# Patient Record
Sex: Female | Born: 1975 | Race: White | Hispanic: No | Smoking: Never smoker
Health system: Southern US, Community
[De-identification: ages and names within clinical notes are randomized; demographics above are authoritative.]

## PROBLEM LIST (undated history)

## (undated) DIAGNOSIS — E785 Hyperlipidemia, unspecified: Secondary | ICD-10-CM

## (undated) DIAGNOSIS — K219 Gastro-esophageal reflux disease without esophagitis: Secondary | ICD-10-CM

## (undated) DIAGNOSIS — K589 Irritable bowel syndrome without diarrhea: Secondary | ICD-10-CM

## (undated) DIAGNOSIS — F419 Anxiety disorder, unspecified: Secondary | ICD-10-CM

## (undated) DIAGNOSIS — F32A Depression, unspecified: Secondary | ICD-10-CM

## (undated) DIAGNOSIS — Z9889 Other specified postprocedural states: Secondary | ICD-10-CM

## (undated) DIAGNOSIS — E119 Type 2 diabetes mellitus without complications: Secondary | ICD-10-CM

## (undated) DIAGNOSIS — G40909 Epilepsy, unspecified, not intractable, without status epilepticus: Secondary | ICD-10-CM

## (undated) DIAGNOSIS — T7840XA Allergy, unspecified, initial encounter: Secondary | ICD-10-CM

## (undated) DIAGNOSIS — R112 Nausea with vomiting, unspecified: Secondary | ICD-10-CM

## (undated) DIAGNOSIS — I10 Essential (primary) hypertension: Secondary | ICD-10-CM

## (undated) HISTORY — DX: Type 2 diabetes mellitus without complications: E11.9

## (undated) HISTORY — DX: Hyperlipidemia, unspecified: E78.5

## (undated) HISTORY — PX: COLONOSCOPY: SHX174

## (undated) HISTORY — PX: BUNIONECTOMY: SHX129

## (undated) HISTORY — DX: Essential (primary) hypertension: I10

## (undated) HISTORY — DX: Irritable bowel syndrome, unspecified: K58.9

## (undated) HISTORY — DX: Depression, unspecified: F32.A

## (undated) HISTORY — DX: Gastro-esophageal reflux disease without esophagitis: K21.9

## (undated) HISTORY — PX: OTHER SURGICAL HISTORY: SHX169

## (undated) HISTORY — DX: Anxiety disorder, unspecified: F41.9

## (undated) HISTORY — DX: Other specified postprocedural states: Z98.890

## (undated) HISTORY — DX: Other specified postprocedural states: R11.2

## (undated) HISTORY — DX: Allergy, unspecified, initial encounter: T78.40XA

## (undated) HISTORY — PX: REPAIR ANKLE LIGAMENT: SUR1187

---

## 2005-03-22 ENCOUNTER — Encounter: Payer: Self-pay | Admitting: Internal Medicine

## 2005-10-03 ENCOUNTER — Ambulatory Visit: Payer: Self-pay | Admitting: Internal Medicine

## 2005-10-05 ENCOUNTER — Ambulatory Visit: Payer: Self-pay | Admitting: Internal Medicine

## 2005-10-09 ENCOUNTER — Encounter (HOSPITAL_COMMUNITY): Admission: RE | Admit: 2005-10-09 | Discharge: 2005-11-29 | Payer: Self-pay | Admitting: Internal Medicine

## 2005-11-08 ENCOUNTER — Ambulatory Visit: Payer: Self-pay | Admitting: Internal Medicine

## 2005-11-20 ENCOUNTER — Ambulatory Visit: Payer: Self-pay | Admitting: Internal Medicine

## 2005-12-20 ENCOUNTER — Ambulatory Visit: Payer: Self-pay | Admitting: Internal Medicine

## 2005-12-20 LAB — CONVERTED CEMR LAB: HCT: 43.6 % (ref 36.0–46.0)

## 2006-01-19 ENCOUNTER — Ambulatory Visit: Payer: Self-pay | Admitting: Internal Medicine

## 2006-01-19 LAB — CONVERTED CEMR LAB: Hemoglobin: 14.3 g/dL (ref 12.0–15.0)

## 2006-02-05 ENCOUNTER — Ambulatory Visit: Payer: Self-pay | Admitting: Internal Medicine

## 2006-02-11 ENCOUNTER — Ambulatory Visit: Payer: Self-pay | Admitting: Oncology

## 2006-02-13 ENCOUNTER — Encounter (HOSPITAL_COMMUNITY): Admission: RE | Admit: 2006-02-13 | Discharge: 2006-02-13 | Payer: Self-pay | Admitting: Internal Medicine

## 2006-05-08 ENCOUNTER — Ambulatory Visit: Payer: Self-pay | Admitting: Oncology

## 2006-05-15 LAB — CBC WITH DIFFERENTIAL/PLATELET
Basophils Absolute: 0.1 10*3/uL (ref 0.0–0.1)
Eosinophils Absolute: 0.1 10*3/uL (ref 0.0–0.5)
HCT: 41.1 % (ref 34.8–46.6)
HGB: 14.5 g/dL (ref 11.6–15.9)
MCV: 90.3 fL (ref 81.0–101.0)
NEUT#: 5.4 10*3/uL (ref 1.5–6.5)
NEUT%: 57.1 % (ref 39.6–76.8)
RDW: 12.8 % (ref 11.3–14.5)
lymph#: 3.3 10*3/uL (ref 0.9–3.3)

## 2006-05-15 LAB — TSH: TSH: 3.253 u[IU]/mL (ref 0.350–5.500)

## 2006-06-28 ENCOUNTER — Ambulatory Visit: Payer: Self-pay | Admitting: Oncology

## 2006-07-03 LAB — CBC WITH DIFFERENTIAL/PLATELET
Basophils Absolute: 0.1 10*3/uL (ref 0.0–0.1)
Eosinophils Absolute: 0.1 10*3/uL (ref 0.0–0.5)
HCT: 41.5 % (ref 34.8–46.6)
HGB: 14.8 g/dL (ref 11.6–15.9)
LYMPH%: 36.3 % (ref 14.0–48.0)
MONO#: 0.7 10*3/uL (ref 0.1–0.9)
NEUT%: 55.6 % (ref 39.6–76.8)
Platelets: 340 10*3/uL (ref 145–400)
WBC: 10.8 10*3/uL — ABNORMAL HIGH (ref 3.9–10.0)
lymph#: 3.9 10*3/uL — ABNORMAL HIGH (ref 0.9–3.3)

## 2006-07-19 LAB — CBC WITH DIFFERENTIAL/PLATELET
Basophils Absolute: 0.1 10*3/uL (ref 0.0–0.1)
Eosinophils Absolute: 0.1 10*3/uL (ref 0.0–0.5)
HGB: 14.1 g/dL (ref 11.6–15.9)
NEUT#: 5.1 10*3/uL (ref 1.5–6.5)
RBC: 4.3 10*6/uL (ref 3.70–5.32)
RDW: 12.7 % (ref 11.3–14.5)
WBC: 9.1 10*3/uL (ref 3.9–10.0)
lymph#: 3.3 10*3/uL (ref 0.9–3.3)

## 2006-07-19 LAB — FERRITIN: Ferritin: 120 ng/mL (ref 10–291)

## 2006-08-08 ENCOUNTER — Ambulatory Visit: Payer: Self-pay | Admitting: Oncology

## 2006-08-17 LAB — CBC WITH DIFFERENTIAL/PLATELET
BASO%: 0.4 % (ref 0.0–2.0)
Eosinophils Absolute: 0.1 10*3/uL (ref 0.0–0.5)
HCT: 39 % (ref 34.8–46.6)
LYMPH%: 36 % (ref 14.0–48.0)
MCHC: 36.1 g/dL — ABNORMAL HIGH (ref 32.0–36.0)
MONO#: 0.4 10*3/uL (ref 0.1–0.9)
NEUT#: 4.5 10*3/uL (ref 1.5–6.5)
NEUT%: 57.5 % (ref 39.6–76.8)
Platelets: 302 10*3/uL (ref 145–400)
RBC: 4.29 10*6/uL (ref 3.70–5.32)
WBC: 7.8 10*3/uL (ref 3.9–10.0)
lymph#: 2.8 10*3/uL (ref 0.9–3.3)

## 2006-08-17 LAB — FERRITIN: Ferritin: 68 ng/mL (ref 10–291)

## 2006-09-07 LAB — CBC WITH DIFFERENTIAL/PLATELET
Basophils Absolute: 0.1 10*3/uL (ref 0.0–0.1)
Eosinophils Absolute: 0.1 10*3/uL (ref 0.0–0.5)
HCT: 34.4 % — ABNORMAL LOW (ref 34.8–46.6)
HGB: 12.5 g/dL (ref 11.6–15.9)
MCH: 32.9 pg (ref 26.0–34.0)
NEUT#: 5.3 10*3/uL (ref 1.5–6.5)
NEUT%: 61.1 % (ref 39.6–76.8)
RDW: 12.6 % (ref 11.3–14.5)
lymph#: 2.8 10*3/uL (ref 0.9–3.3)

## 2006-10-01 ENCOUNTER — Ambulatory Visit: Payer: Self-pay | Admitting: Internal Medicine

## 2006-10-15 ENCOUNTER — Ambulatory Visit: Payer: Self-pay | Admitting: Internal Medicine

## 2006-10-15 LAB — CONVERTED CEMR LAB
ALT: 16 units/L (ref 0–35)
Basophils Absolute: 0.1 10*3/uL (ref 0.0–0.1)
Eosinophils Absolute: 0.1 10*3/uL (ref 0.0–0.6)
HCT: 38.3 % (ref 36.0–46.0)
Hemoglobin: 13.6 g/dL (ref 12.0–15.0)
MCHC: 35.5 g/dL (ref 30.0–36.0)
MCV: 88.9 fL (ref 78.0–100.0)
Monocytes Absolute: 0.6 10*3/uL (ref 0.2–0.7)
Monocytes Relative: 5.7 % (ref 3.0–11.0)
Neutro Abs: 6.7 10*3/uL (ref 1.4–7.7)
Neutrophils Relative %: 66.2 % (ref 43.0–77.0)

## 2006-12-05 ENCOUNTER — Ambulatory Visit: Payer: Self-pay | Admitting: Oncology

## 2007-02-21 ENCOUNTER — Ambulatory Visit: Payer: Self-pay | Admitting: Oncology

## 2007-05-02 ENCOUNTER — Ambulatory Visit: Payer: Self-pay | Admitting: Oncology

## 2007-05-06 LAB — COMPREHENSIVE METABOLIC PANEL
Albumin: 4.5 g/dL (ref 3.5–5.2)
CO2: 24 mEq/L (ref 19–32)
Calcium: 9.8 mg/dL (ref 8.4–10.5)
Glucose, Bld: 97 mg/dL (ref 70–99)
Potassium: 4.5 mEq/L (ref 3.5–5.3)
Sodium: 137 mEq/L (ref 135–145)
Total Protein: 7 g/dL (ref 6.0–8.3)

## 2007-05-06 LAB — CBC WITH DIFFERENTIAL/PLATELET
Eosinophils Absolute: 0 10*3/uL (ref 0.0–0.5)
HGB: 14.5 g/dL (ref 11.6–15.9)
MONO#: 0.4 10*3/uL (ref 0.1–0.9)
NEUT#: 7.4 10*3/uL — ABNORMAL HIGH (ref 1.5–6.5)
Platelets: 369 10*3/uL (ref 145–400)
RBC: 4.59 10*6/uL (ref 3.70–5.32)
RDW: 12.6 % (ref 11.3–14.5)
WBC: 10.7 10*3/uL — ABNORMAL HIGH (ref 3.9–10.0)

## 2007-05-06 LAB — FERRITIN: Ferritin: 97 ng/mL (ref 10–291)

## 2007-06-19 ENCOUNTER — Ambulatory Visit: Payer: Self-pay | Admitting: Oncology

## 2007-06-21 LAB — CBC WITH DIFFERENTIAL/PLATELET
EOS%: 1.9 % (ref 0.0–7.0)
Eosinophils Absolute: 0.2 10*3/uL (ref 0.0–0.5)
MCV: 90.2 fL (ref 81.0–101.0)
MONO%: 5.2 % (ref 0.0–13.0)
NEUT#: 5.7 10*3/uL (ref 1.5–6.5)
RBC: 4.11 10*6/uL (ref 3.70–5.32)
RDW: 11.9 % (ref 11.3–14.5)
lymph#: 3.1 10*3/uL (ref 0.9–3.3)

## 2007-09-03 ENCOUNTER — Ambulatory Visit: Payer: Self-pay | Admitting: Oncology

## 2007-09-20 ENCOUNTER — Telehealth: Payer: Self-pay | Admitting: Internal Medicine

## 2007-10-02 ENCOUNTER — Ambulatory Visit: Payer: Self-pay | Admitting: Internal Medicine

## 2007-10-02 ENCOUNTER — Ambulatory Visit (HOSPITAL_COMMUNITY): Admission: RE | Admit: 2007-10-02 | Discharge: 2007-10-02 | Payer: Self-pay | Admitting: Internal Medicine

## 2007-10-03 LAB — CONVERTED CEMR LAB
AFP-Tumor Marker: 3.3 ng/mL (ref 0.0–8.0)
ALT: 19 units/L (ref 0–35)
AST: 19 units/L (ref 0–37)
Albumin: 4.1 g/dL (ref 3.5–5.2)
Iron: 40 ug/dL — ABNORMAL LOW (ref 42–145)
Total Protein: 6.6 g/dL (ref 6.0–8.3)
Transferrin: 221.6 mg/dL (ref >=212.0)

## 2007-10-07 ENCOUNTER — Telehealth: Payer: Self-pay | Admitting: Internal Medicine

## 2007-10-09 ENCOUNTER — Ambulatory Visit: Payer: Self-pay | Admitting: Internal Medicine

## 2007-10-11 ENCOUNTER — Ambulatory Visit (HOSPITAL_COMMUNITY): Admission: RE | Admit: 2007-10-11 | Discharge: 2007-10-11 | Payer: Self-pay | Admitting: Internal Medicine

## 2007-10-11 ENCOUNTER — Telehealth: Payer: Self-pay | Admitting: Internal Medicine

## 2007-10-18 ENCOUNTER — Ambulatory Visit (HOSPITAL_COMMUNITY): Admission: RE | Admit: 2007-10-18 | Discharge: 2007-10-18 | Payer: Self-pay | Admitting: Internal Medicine

## 2007-12-20 ENCOUNTER — Encounter: Payer: Self-pay | Admitting: Cardiology

## 2007-12-24 ENCOUNTER — Ambulatory Visit: Payer: Self-pay | Admitting: Oncology

## 2007-12-26 ENCOUNTER — Encounter: Payer: Self-pay | Admitting: Internal Medicine

## 2007-12-26 LAB — FERRITIN: Ferritin: 15 ng/mL (ref 10–291)

## 2008-04-24 ENCOUNTER — Ambulatory Visit: Payer: Self-pay | Admitting: Oncology

## 2008-04-28 LAB — FERRITIN: Ferritin: 25 ng/mL (ref 10–291)

## 2008-06-26 ENCOUNTER — Encounter: Admission: RE | Admit: 2008-06-26 | Discharge: 2008-06-26 | Payer: Self-pay | Admitting: Family Medicine

## 2008-08-21 ENCOUNTER — Ambulatory Visit: Payer: Self-pay | Admitting: Oncology

## 2008-08-25 ENCOUNTER — Encounter: Payer: Self-pay | Admitting: Internal Medicine

## 2008-08-25 LAB — AFP TUMOR MARKER: AFP-Tumor Marker: 3.8 ng/mL (ref 0.0–8.0)

## 2008-09-15 ENCOUNTER — Telehealth (INDEPENDENT_AMBULATORY_CARE_PROVIDER_SITE_OTHER): Payer: Self-pay | Admitting: *Deleted

## 2008-09-22 ENCOUNTER — Ambulatory Visit: Payer: Self-pay | Admitting: Internal Medicine

## 2008-09-22 LAB — CONVERTED CEMR LAB
AFP-Tumor Marker: 4 ng/mL (ref 0.0–8.0)
ALT: 14 units/L (ref 0–35)
AST: 16 units/L (ref 0–37)
Alkaline Phosphatase: 78 units/L (ref 39–117)
Bilirubin, Direct: 0.2 mg/dL (ref 0.0–0.3)
Total Bilirubin: 1 mg/dL (ref 0.3–1.2)
Transferrin: 222.2 mg/dL (ref 212.0–360.0)

## 2008-09-23 ENCOUNTER — Encounter: Payer: Self-pay | Admitting: Internal Medicine

## 2008-09-25 ENCOUNTER — Ambulatory Visit (HOSPITAL_COMMUNITY): Admission: RE | Admit: 2008-09-25 | Discharge: 2008-09-25 | Payer: Self-pay | Admitting: Internal Medicine

## 2008-10-27 DIAGNOSIS — G40909 Epilepsy, unspecified, not intractable, without status epilepticus: Secondary | ICD-10-CM | POA: Insufficient documentation

## 2008-10-27 DIAGNOSIS — E785 Hyperlipidemia, unspecified: Secondary | ICD-10-CM | POA: Insufficient documentation

## 2008-10-27 DIAGNOSIS — F418 Other specified anxiety disorders: Secondary | ICD-10-CM | POA: Insufficient documentation

## 2008-11-02 ENCOUNTER — Ambulatory Visit: Payer: Self-pay | Admitting: Internal Medicine

## 2008-11-02 DIAGNOSIS — K589 Irritable bowel syndrome without diarrhea: Secondary | ICD-10-CM | POA: Insufficient documentation

## 2008-11-02 LAB — CONVERTED CEMR LAB
Basophils Relative: 0.4 % (ref 0.0–3.0)
Eosinophils Relative: 0.6 % (ref 0.0–5.0)
HCT: 41.3 % (ref 36.0–46.0)
Lymphs Abs: 2.5 10*3/uL (ref 0.7–4.0)
Monocytes Relative: 4.8 % (ref 3.0–12.0)
Platelets: 251 10*3/uL (ref 150.0–400.0)
RBC: 4.39 M/uL (ref 3.87–5.11)
TSH: 2.47 microintl units/mL (ref 0.35–5.50)
Vitamin B-12: 384 pg/mL (ref 211–911)
WBC: 8.1 10*3/uL (ref 4.5–10.5)

## 2008-11-04 ENCOUNTER — Telehealth: Payer: Self-pay | Admitting: Internal Medicine

## 2008-11-23 ENCOUNTER — Ambulatory Visit: Payer: Self-pay | Admitting: Oncology

## 2008-11-25 LAB — FERRITIN: Ferritin: 40 ng/mL (ref 10–291)

## 2009-01-25 ENCOUNTER — Ambulatory Visit: Payer: Self-pay | Admitting: Internal Medicine

## 2009-01-25 LAB — CONVERTED CEMR LAB
Basophils Relative: 0.9 % (ref 0.0–3.0)
Ferritin: 56.8 ng/mL (ref 10.0–291.0)
HCT: 40 % (ref 36.0–46.0)
Hemoglobin: 13.6 g/dL (ref 12.0–15.0)
Lymphocytes Relative: 20.6 % (ref 12.0–46.0)
Monocytes Relative: 4.4 % (ref 3.0–12.0)
Neutro Abs: 8 10*3/uL — ABNORMAL HIGH (ref 1.4–7.7)
RBC: 4.22 M/uL (ref 3.87–5.11)
Saturation Ratios: 23.4 % (ref 20.0–50.0)

## 2009-02-22 ENCOUNTER — Ambulatory Visit: Payer: Self-pay | Admitting: Oncology

## 2009-02-24 LAB — COMPREHENSIVE METABOLIC PANEL
ALT: 11 U/L (ref 0–35)
BUN: 13 mg/dL (ref 6–23)
CO2: 26 mEq/L (ref 19–32)
Calcium: 9.3 mg/dL (ref 8.4–10.5)
Chloride: 104 mEq/L (ref 96–112)
Creatinine, Ser: 0.93 mg/dL (ref 0.40–1.20)
Glucose, Bld: 102 mg/dL — ABNORMAL HIGH (ref 70–99)
Total Bilirubin: 0.6 mg/dL (ref 0.3–1.2)

## 2009-02-24 LAB — FERRITIN: Ferritin: 45 ng/mL (ref 10–291)

## 2009-05-21 ENCOUNTER — Ambulatory Visit: Payer: Self-pay | Admitting: Oncology

## 2009-08-24 ENCOUNTER — Ambulatory Visit: Payer: Self-pay | Admitting: Oncology

## 2009-08-26 ENCOUNTER — Encounter: Payer: Self-pay | Admitting: Internal Medicine

## 2009-08-26 LAB — FERRITIN: Ferritin: 55 ng/mL (ref 10–291)

## 2009-09-17 LAB — CBC WITH DIFFERENTIAL/PLATELET
BASO%: 1.3 % (ref 0.0–2.0)
EOS%: 0.9 % (ref 0.0–7.0)
HCT: 40.7 % (ref 34.8–46.6)
LYMPH%: 28 % (ref 14.0–49.7)
MCH: 31.5 pg (ref 25.1–34.0)
MCHC: 34 g/dL (ref 31.5–36.0)
NEUT%: 66.4 % (ref 38.4–76.8)
Platelets: 306 10*3/uL (ref 145–400)
RBC: 4.4 10*6/uL (ref 3.70–5.45)
WBC: 9.7 10*3/uL (ref 3.9–10.3)

## 2009-09-29 ENCOUNTER — Ambulatory Visit: Payer: Self-pay | Admitting: Oncology

## 2009-10-11 ENCOUNTER — Ambulatory Visit: Payer: Self-pay | Admitting: Internal Medicine

## 2009-10-13 LAB — CONVERTED CEMR LAB: AFP-Tumor Marker: 4 ng/mL (ref 0.0–8.0)

## 2009-10-15 LAB — CBC WITH DIFFERENTIAL/PLATELET
Basophils Absolute: 0.1 10*3/uL (ref 0.0–0.1)
EOS%: 0.6 % (ref 0.0–7.0)
Eosinophils Absolute: 0 10*3/uL (ref 0.0–0.5)
LYMPH%: 29.7 % (ref 14.0–49.7)
MCH: 32.7 pg (ref 25.1–34.0)
MCV: 93.7 fL (ref 79.5–101.0)
MONO%: 6.5 % (ref 0.0–14.0)
NEUT#: 4.2 10*3/uL (ref 1.5–6.5)
Platelets: 315 10*3/uL (ref 145–400)
RBC: 3.95 10*6/uL (ref 3.70–5.45)
RDW: 12.8 % (ref 11.2–14.5)

## 2009-10-15 LAB — FERRITIN: Ferritin: 19 ng/mL (ref 10–291)

## 2009-12-22 ENCOUNTER — Ambulatory Visit: Payer: Self-pay | Admitting: Oncology

## 2010-02-07 ENCOUNTER — Ambulatory Visit: Payer: Self-pay | Admitting: Oncology

## 2010-03-29 LAB — ABO/RH

## 2010-03-29 LAB — RPR: RPR: NONREACTIVE

## 2010-03-29 LAB — GC/CHLAMYDIA PROBE AMP, GENITAL: Gonorrhea: NEGATIVE

## 2010-03-29 LAB — RUBELLA ANTIBODY, IGM: Rubella: IMMUNE

## 2010-03-29 LAB — ANTIBODY SCREEN: Antibody Screen: NEGATIVE

## 2010-04-04 ENCOUNTER — Encounter: Payer: Self-pay | Admitting: Internal Medicine

## 2010-04-12 NOTE — Letter (Signed)
[facility]   Imported By: Sherian Rein 09/28/2009 09:38:25  _____________________________________________________________________  External Attachment:    Type:   Image     Comment:   External Document

## 2010-05-02 ENCOUNTER — Other Ambulatory Visit: Payer: Self-pay | Admitting: Oncology

## 2010-05-02 ENCOUNTER — Encounter (HOSPITAL_BASED_OUTPATIENT_CLINIC_OR_DEPARTMENT_OTHER): Payer: BC Managed Care – PPO | Admitting: Oncology

## 2010-05-02 LAB — CBC WITH DIFFERENTIAL/PLATELET
BASO%: 0.5 % (ref 0.0–2.0)
EOS%: 0 % (ref 0.0–7.0)
Eosinophils Absolute: 0 10*3/uL (ref 0.0–0.5)
LYMPH%: 17.9 % (ref 14.0–49.7)
MONO#: 0.4 10*3/uL (ref 0.1–0.9)
NEUT#: 5.1 10*3/uL (ref 1.5–6.5)
NEUT%: 75.5 % (ref 38.4–76.8)
RBC: 4.04 10*6/uL (ref 3.70–5.45)
RDW: 12.9 % (ref 11.2–14.5)
WBC: 6.8 10*3/uL (ref 3.9–10.3)
lymph#: 1.2 10*3/uL (ref 0.9–3.3)

## 2010-05-03 ENCOUNTER — Telehealth: Payer: Self-pay | Admitting: Internal Medicine

## 2010-05-03 ENCOUNTER — Telehealth (INDEPENDENT_AMBULATORY_CARE_PROVIDER_SITE_OTHER): Payer: Self-pay | Admitting: *Deleted

## 2010-05-09 ENCOUNTER — Encounter (HOSPITAL_BASED_OUTPATIENT_CLINIC_OR_DEPARTMENT_OTHER): Payer: BC Managed Care – PPO | Admitting: Oncology

## 2010-05-10 ENCOUNTER — Encounter: Payer: Self-pay | Admitting: Internal Medicine

## 2010-05-10 NOTE — Progress Notes (Signed)
  Phone Note Other Incoming   Request: Send information Summary of Call: Patient completed a [facility] medical release to obtain copies of her records from  Dr. Royal Hawthorn. The practice has closed;however, a copy service is  still copying records. I faxed the release form to [phone] for processing.  The voice mail has a recording to do so for records request.

## 2010-05-10 NOTE — Progress Notes (Signed)
Summary: Pregnant question about her meds  Phone Note Call from Patient Call back at Home Phone [phone]   Call For: Dr Juanda Chance Summary of Call: Is pregnant and has a question about her meds. Also wonders if Dr Juanda Chance has a recomendation for her  for a new primary care physician because hers is no longer practicing. Initial call taken by: Leanor Kail [facility],  May 03, 2010 11:27 AM  Follow-up for Phone Call        Patient is [redacted] weeks pregnant.  Her OB-Gyn MD told her to take Tylenol for headaches. Patient states Dr. Juanda Chance told her not to take Tylenol because of her liver. She wants to know what she can take for a headache. Also, she would like to know if Dr. Juanda Chance will recommend a PCP for her since her PCP is no longer practicing. Hx homozygous hemachromatosisPlease, advise Follow-up by: Jesse Fall RN,  May 03, 2010 1:38 PM  Additional Follow-up for Phone Call Additional follow up Details #1::        congratulations on getting pregnant. She may take Tylenol plain, if she does not exceed 3/day. From My standpoint Aleve would be better, but I don't know if it is OK from [facility] standpoint. I suggest Dr Rene Paci. Additional Follow-up by: Hart Carwin MD,  May 03, 2010 10:15 PM    Additional Follow-up for Phone Call Additional follow up Details #2::    Patient given Dr. Delia Chimes recommendations Follow-up by: Jesse Fall RN,  May 04, 2010 8:45 AM

## 2010-05-13 ENCOUNTER — Telehealth: Payer: Self-pay | Admitting: Internal Medicine

## 2010-05-19 NOTE — Progress Notes (Signed)
Summary: Triage  Phone Note Call from Patient Call back at Home Phone [phone]   Caller: Patient Call For: Dr. Juanda Chance Reason for Call: Talk to Nurse Summary of Call: pt. is pregnant and OBGYN is going to call her in Vicodin and wants to know if it will be okay to take Initial call taken by: Karna Christmas,  May 13, 2010 10:42 AM  Follow-up for Phone Call        Patient calling because she is having pain from fibroid tumors and pregnancy. Her OB told her to take Tylenol 2 tabs every 3 hours(which she knew she could not take) or he would call in [place]. She is wanting to know if she should take Vicodan. Follow-up by: Jesse Fall RN,  May 13, 2010 10:52 AM  Additional Follow-up for Phone Call Additional follow up Details #1::        Vicodin is safe for the liver ( she has hemochromatosis), not to exceed  6/day.    Additional Follow-up for Phone Call Additional follow up Details #2::    Patient given Dr. Regino Schultze recommendation. Follow-up by: Jesse Fall RN,  May 13, 2010 1:41 PM

## 2010-05-24 NOTE — Letter (Signed)
Summary: Rolene Arbour MD/[facility] MD/[facility]   Imported By: Lester [place] 05/20/2010 07:36:02  _____________________________________________________________________  External Attachment:    Type:   Image     Comment:   External Document

## 2010-05-25 ENCOUNTER — Other Ambulatory Visit (HOSPITAL_COMMUNITY): Payer: Self-pay | Admitting: Obstetrics & Gynecology

## 2010-05-25 DIAGNOSIS — IMO0002 Reserved for concepts with insufficient information to code with codable children: Secondary | ICD-10-CM

## 2010-05-25 DIAGNOSIS — O09529 Supervision of elderly multigravida, unspecified trimester: Secondary | ICD-10-CM

## 2010-05-25 DIAGNOSIS — Z0489 Encounter for examination and observation for other specified reasons: Secondary | ICD-10-CM

## 2010-06-02 ENCOUNTER — Telehealth: Payer: Self-pay | Admitting: Internal Medicine

## 2010-06-03 ENCOUNTER — Encounter: Payer: Self-pay | Admitting: Internal Medicine

## 2010-06-09 ENCOUNTER — Other Ambulatory Visit (HOSPITAL_COMMUNITY): Payer: Self-pay | Admitting: Obstetrics & Gynecology

## 2010-06-09 ENCOUNTER — Ambulatory Visit (HOSPITAL_COMMUNITY)
Admission: RE | Admit: 2010-06-09 | Discharge: 2010-06-09 | Disposition: A | Discharge status: Home / Self Care | Payer: BC Managed Care – PPO | Source: Ambulatory Visit | Attending: Obstetrics & Gynecology | Admitting: Obstetrics & Gynecology

## 2010-06-09 DIAGNOSIS — O09529 Supervision of elderly multigravida, unspecified trimester: Secondary | ICD-10-CM

## 2010-06-09 DIAGNOSIS — Z1389 Encounter for screening for other disorder: Secondary | ICD-10-CM | POA: Insufficient documentation

## 2010-06-09 DIAGNOSIS — Z0489 Encounter for examination and observation for other specified reasons: Secondary | ICD-10-CM

## 2010-06-09 DIAGNOSIS — Z363 Encounter for antenatal screening for malformations: Secondary | ICD-10-CM | POA: Insufficient documentation

## 2010-06-09 DIAGNOSIS — O358XX0 Maternal care for other (suspected) fetal abnormality and damage, not applicable or unspecified: Secondary | ICD-10-CM | POA: Insufficient documentation

## 2010-06-09 DIAGNOSIS — IMO0002 Reserved for concepts with insufficient information to code with codable children: Secondary | ICD-10-CM

## 2010-06-09 DIAGNOSIS — O3410 Maternal care for benign tumor of corpus uteri, unspecified trimester: Secondary | ICD-10-CM | POA: Insufficient documentation

## 2010-06-09 DIAGNOSIS — O2690 Pregnancy related conditions, unspecified, unspecified trimester: Secondary | ICD-10-CM

## 2010-06-09 DIAGNOSIS — O09519 Supervision of elderly primigravida, unspecified trimester: Secondary | ICD-10-CM | POA: Insufficient documentation

## 2010-06-09 NOTE — Letter (Signed)
Summary: [facility] Practice Notes 2008-present  [facility] Notes 2008-present   Imported By: Lamona Curl CMA (AAMA) 06/03/2010 12:17:31  _____________________________________________________________________  External Attachment:    Type:   Image     Comment:   External Document

## 2010-07-07 ENCOUNTER — Ambulatory Visit (HOSPITAL_COMMUNITY)
Admission: RE | Admit: 2010-07-07 | Discharge: 2010-07-07 | Disposition: A | Discharge status: Home / Self Care | Payer: BC Managed Care – PPO | Source: Ambulatory Visit | Attending: Obstetrics & Gynecology | Admitting: Obstetrics & Gynecology

## 2010-07-07 ENCOUNTER — Ambulatory Visit (HOSPITAL_COMMUNITY): Payer: BC Managed Care – PPO

## 2010-07-07 DIAGNOSIS — O2690 Pregnancy related conditions, unspecified, unspecified trimester: Secondary | ICD-10-CM

## 2010-07-07 DIAGNOSIS — Z3689 Encounter for other specified antenatal screening: Secondary | ICD-10-CM | POA: Insufficient documentation

## 2010-07-07 DIAGNOSIS — O09519 Supervision of elderly primigravida, unspecified trimester: Secondary | ICD-10-CM | POA: Insufficient documentation

## 2010-07-07 DIAGNOSIS — O3410 Maternal care for benign tumor of corpus uteri, unspecified trimester: Secondary | ICD-10-CM | POA: Insufficient documentation

## 2010-07-07 DIAGNOSIS — O09529 Supervision of elderly multigravida, unspecified trimester: Secondary | ICD-10-CM

## 2010-07-28 ENCOUNTER — Telehealth: Payer: Self-pay | Admitting: Internal Medicine

## 2010-07-28 NOTE — Telephone Encounter (Signed)
Patient calling to see if it is okay to take Ibuprofen 800 mg for ligament pain. Her OB GYN has suggested she try this once and see if it helps the pain but she wants to be sure this is okay with Dr. Juanda Chance. Hx homozygous hemachromatosis. Please, advise.

## 2010-07-28 NOTE — Telephone Encounter (Signed)
Patient notified of Dr. Brodie's recommendation 

## 2010-07-28 NOTE — Telephone Encounter (Signed)
OK to take Ibuprofen 800mg , not to exceed 1/day  And not over 2 weeks.

## 2010-07-29 NOTE — Procedures (Signed)
[facility]                                 ULTRASOUND STUDY   NAME:ROGERSMarty, Sadlowski                       MRN:          [id number]  DATE:10/05/2005                            DOB:          08/01/1975    READING PHYSICIAN:  Malcom T. Russella Dar, MD, [facility]   PROCEDURE:  Upper abdominal ultrasound.   INDICATIONS:  Abdominal pain.  Abnormal liver function test.  Question of  history hemachromatosis.   RESULTS:  Abdominal aorta 1.5 cm.  The IVC is patent.   The pancreas body appears within normal limits.  The head and tail are not  visualized due to overlying gas.   Gallbladder is well distended, thin walled, with no pericholecystic fluid or  intraluminal echogenic foci to suggest gallstone disease.   The common bile duct measures 4.1 mm in maximal diameter without evidence of  intraluminal foci.   The liver has diffuse heterogeneous echotexture with multiple hyperechoic  areas of 5 x 3 x 3.1 cm, hyperechoic area in the left lobe of the liver.   Kidneys - Right 8.6 cm, left 10.8 cm.   Spleen maximum length, 10.6 cm.   IMPRESSION:  1.  Heterogenous liver with increased echotexture with irregular focal      lesions consistent with liver disease.  No evidence of splenomegaly,      although spleen is upper limits of normal.  2.  Normal gallbladder.  3.  Suboptimal visualization of the head and tail of the pancreas.                                   Hedwig Morton. Juanda Chance, MD   DMB/MedQ  DD:  10/05/2005  DT:  10/05/2005  Job #:  [id number]

## 2010-07-29 NOTE — Assessment & Plan Note (Signed)
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  She was diagnosed  in [place] where she worked before that with hemochromatosis and was has  started on a series of phlebotomies.  She has had about 10 phlebotomies so  far, the last one approximately 8 weeks ago and she was getting them every 2  weeks.  She is anxious to restart her phlebotomies.  She also needs a  primary care physician being on Depakote for seizure disorder and also  wanting to check her blood sugars of possibility of diabetes with family  history of diabetes.  Hemochromatosis was diagnosed positive for 28 Y  genotype as well as on liver biopsies which showed excessive iron.  Initial  ferritin level was over 2000.  The last one was down to 500 and iron  saturation was down to 41%.  She denies any specific symptoms of liver  disease.  She has not had portal hypertension, GI bleeding, encephalopathy.  She is a fully functioning attorney.  She has not missed any work.   MEDICATIONS:  1.  Depakote 500 mg p.o. b.i.d.  2.  Folic acid 1 p.o. daily.  3.  Calcium 600 mg b.i.d.  4.  Birth control pills.  5.  Advil.   PAST MEDICAL HISTORY:  1.  High cholesterol.  2.  Anxiety.  3.  Seizure disorder, but no seizure in the last 16 years.   FAMILY HISTORY:  Positive for hemochromatosis in a maternal uncle.  Diabetes  in maternal uncle and grandmother.  Father with alcoholism.  Parkinson's in  another uncle.  Heart disease in maternal grandmother.   SOCIAL HISTORY:  She is married and has no children.  Does not smoke.  Drinks one drink of wine less than once a month.   REVIEW OF SYSTEMS:   Positive for eye glasses and sleeping problems with  vision changes, recently blurred vision.  The patient wants to be checked  for diabetes.   PHYSICAL EXAMINATION:  VITAL SIGNS:  Blood pressure 134/84, pulse 76, weight  202 pounds.  GENERAL:  She was mildly overweight, alert and oriented.  There were no  spider nevi, Dupuytren contractures, palmar erythema.  HEENT:  Sclerae nonicteric.  Oral cavity normal.  NECK:  Supple with no adenopathy.  LUNGS:  Clear to auscultation.  CARDIAC:  Normal S1, S2.  ABDOMEN:  Soft, mildly obese with liver edge at costal margin.  Normoactive  bowel sounds.  No distention.  RECTAL:  Normal tone, Hemoccult negative.  EXTREMITIES:  No edema.   IMPRESSION:  A 35 year old, white female with a homozygous hemochromatosis  started on phlebotomies.  Last iron saturation was 41%.   PLAN:  1.  Complete hepatitis A and B vaccines.  She is due for vaccine for  hepatitis A vaccine and hepatitis B.  2.  Hemoglobin A1c check today.  3.  Iron studies, ferritin and transferrin levels today.  4.  Schedule phlebotomy as soon as possible and then every 2 weeks.  5.  Ultrasound of the upper abdomen which should be done on a yearly basis.      I will see her again in 6 weeks.  She will also need a referral for      general medical care.                                   Hedwig Morton. Juanda Chance, MD   DMB/MedQ  DD:  10/03/2005  DT:  10/03/2005  Job #:  [id number]   cc:   Molly Coventry, MD

## 2010-07-29 NOTE — Letter (Signed)
February 05, 2006    Leighton Roach. Truett Perna, M.D.  [address]  [place], [place] [address]   RE:  Molly Quinn, Molly Quinn  MRN:  [id number]  /  DOB:  12-21-1975   Dear Nida Boatman,   I would appreciate your seeing Molly Quinn for followup of  hemachromatosis.  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  Her liver biopsy  showed excess iron with hepatic iron index of 2 with macrovesicular  steatosis but actually no fibrosis.  Her brother is getting phlebotomies on  a weekly basis and  she has had phlebotomies every two weeks.  Initial  ferritin levels were over 2000, then decreased to 500, and since she has  been with [place], the last ferritin level was 47.6 with iron saturation of 23%.  Her hemoglobin, however, has stayed up over 14, two weeks ago, 14.8,  hematocrit 43.6.  Her last phlebotomy  was about three weeks ago.  She was  getting phlebotomies at [facility], then in our office, which she  did not like, and now we set her up again for [facility].  I think  it would be really more efficient for her to be followed by you for the  phlebotomies . in my experience, Your office has been able to handle the  phlebotomy shedules  more efficiently than ours.  In my experience, it has  worked out well if I see the patients with hemochromatosis for liver-  associated problems about every six months to a year, and if your office  could handles the hematological aspects of the hemachromatosis, I hope this  is agreeable to you.  She is a very nice and compliant patient.   We have now scheduled her for three phlebotomies every four weeks in  December, January, and February but pleae, feel free to change it when You  see her. I suggested that she continue  the phlebotomies until her hemoglobin drops below 11, but you may have  different parameters for  that, and that is why I would prefer if you would  manage it from a hematological standpoint.  Please let me know if I can be  of further help.  I am forwarding all of the records that I have pertaining  to her disease.    Sincerely,      Hedwig Morton. Juanda Chance, MD  Electronically Signed    DMB/MedQ  DD: 02/05/2006  DT: 02/05/2006  Job #: [id number]

## 2010-09-01 ENCOUNTER — Inpatient Hospital Stay (HOSPITAL_COMMUNITY): Payer: BC Managed Care – PPO

## 2010-09-01 ENCOUNTER — Inpatient Hospital Stay (HOSPITAL_COMMUNITY)
Admission: AD | Admit: 2010-09-01 | Discharge: 2010-09-01 | Disposition: A | Discharge status: Home / Self Care | Payer: BC Managed Care – PPO | Source: Ambulatory Visit | Attending: Obstetrics and Gynecology | Admitting: Obstetrics and Gynecology

## 2010-09-01 DIAGNOSIS — O4690 Antepartum hemorrhage, unspecified, unspecified trimester: Secondary | ICD-10-CM | POA: Insufficient documentation

## 2010-09-01 LAB — URINALYSIS, ROUTINE W REFLEX MICROSCOPIC
Bilirubin Urine: NEGATIVE
Glucose, UA: NEGATIVE mg/dL
Ketones, ur: NEGATIVE mg/dL
Protein, ur: NEGATIVE mg/dL
Urobilinogen, UA: 0.2 mg/dL (ref 0.0–1.0)

## 2010-09-01 LAB — URINE MICROSCOPIC-ADD ON

## 2010-10-12 LAB — STREP B DNA PROBE: GBS: NEGATIVE

## 2010-11-08 ENCOUNTER — Encounter (HOSPITAL_COMMUNITY): Payer: Self-pay

## 2010-11-08 ENCOUNTER — Inpatient Hospital Stay (HOSPITAL_COMMUNITY)
Admission: AD | Admit: 2010-11-08 | Discharge: 2010-11-11 | DRG: 371 | Disposition: A | Discharge status: Home / Self Care | Payer: BC Managed Care – PPO | Source: Ambulatory Visit | Attending: Obstetrics & Gynecology | Admitting: Obstetrics & Gynecology

## 2010-11-08 ENCOUNTER — Encounter (HOSPITAL_COMMUNITY)
Admission: AD | Disposition: A | Discharge status: Home / Self Care | Payer: Self-pay | Source: Ambulatory Visit | Attending: Obstetrics & Gynecology

## 2010-11-08 ENCOUNTER — Encounter (HOSPITAL_COMMUNITY): Payer: Self-pay | Admitting: Anesthesiology

## 2010-11-08 ENCOUNTER — Encounter (HOSPITAL_COMMUNITY): Payer: Self-pay | Admitting: *Deleted

## 2010-11-08 ENCOUNTER — Telehealth (HOSPITAL_COMMUNITY): Payer: Self-pay | Admitting: *Deleted

## 2010-11-08 ENCOUNTER — Inpatient Hospital Stay (HOSPITAL_COMMUNITY): Payer: BC Managed Care – PPO | Admitting: Anesthesiology

## 2010-11-08 DIAGNOSIS — O99893 Other specified diseases and conditions complicating puerperium: Secondary | ICD-10-CM | POA: Diagnosis not present

## 2010-11-08 DIAGNOSIS — I498 Other specified cardiac arrhythmias: Secondary | ICD-10-CM | POA: Diagnosis not present

## 2010-11-08 DIAGNOSIS — G40909 Epilepsy, unspecified, not intractable, without status epilepticus: Secondary | ICD-10-CM | POA: Diagnosis present

## 2010-11-08 DIAGNOSIS — R0602 Shortness of breath: Secondary | ICD-10-CM | POA: Diagnosis not present

## 2010-11-08 DIAGNOSIS — O99354 Diseases of the nervous system complicating childbirth: Secondary | ICD-10-CM | POA: Diagnosis present

## 2010-11-08 HISTORY — DX: Epilepsy, unspecified, not intractable, without status epilepticus: G40.909

## 2010-11-08 HISTORY — DX: Hemochromatosis, unspecified: E83.119

## 2010-11-08 LAB — CBC
HCT: 39 % (ref 36.0–46.0)
Hemoglobin: 13.4 g/dL (ref 12.0–15.0)
MCH: 30.1 pg (ref 26.0–34.0)
MCHC: 34.4 g/dL (ref 30.0–36.0)
RDW: 13.2 % (ref 11.5–15.5)

## 2010-11-08 SURGERY — Surgical Case
Anesthesia: Epidural | Site: Abdomen | Wound class: Clean Contaminated

## 2010-11-08 MED ORDER — LIDOCAINE HCL 1.5 % IJ SOLN
INTRAMUSCULAR | Status: DC | PRN
  Administered 2010-11-08 (×2): 5 mL via EPIDURAL

## 2010-11-08 MED ORDER — HYDROMORPHONE HCL 1 MG/ML IJ SOLN: 0.2500 mg | INTRAMUSCULAR | Status: DC | PRN

## 2010-11-08 MED ORDER — CEFAZOLIN SODIUM 1-5 GM-% IV SOLN
INTRAVENOUS | Status: DC | PRN
  Administered 2010-11-08: 2 g via INTRAVENOUS

## 2010-11-08 MED ORDER — DIPHENHYDRAMINE HCL 50 MG/ML IJ SOLN: 12.5000 mg | INTRAMUSCULAR | Status: DC | PRN

## 2010-11-08 MED ORDER — MEPERIDINE HCL 25 MG/ML IJ SOLN
INTRAMUSCULAR | Status: AC
  Filled 2010-11-08: qty 1

## 2010-11-08 MED ORDER — EPHEDRINE 5 MG/ML INJ
10.0000 mg | INTRAVENOUS | Status: DC | PRN
  Filled 2010-11-08 (×2): qty 4

## 2010-11-08 MED ORDER — PHENYLEPHRINE 40 MCG/ML (10ML) SYRINGE FOR IV PUSH (FOR BLOOD PRESSURE SUPPORT)
80.0000 ug | PREFILLED_SYRINGE | INTRAVENOUS | Status: DC | PRN
  Filled 2010-11-08 (×2): qty 5

## 2010-11-08 MED ORDER — OXYTOCIN 10 UNIT/ML IJ SOLN
INTRAMUSCULAR | Status: AC
  Filled 2010-11-08: qty 2

## 2010-11-08 MED ORDER — ONDANSETRON HCL 4 MG/2ML IJ SOLN
INTRAMUSCULAR | Status: DC | PRN
  Administered 2010-11-08: 4 mg via INTRAVENOUS

## 2010-11-08 MED ORDER — MORPHINE SULFATE (PF) 0.5 MG/ML IJ SOLN
INTRAMUSCULAR | Status: DC | PRN
  Administered 2010-11-08: 1 mg via INTRAVENOUS
  Administered 2010-11-08: 4 mg via EPIDURAL

## 2010-11-08 MED ORDER — LACTATED RINGERS IV SOLN
500.0000 mL | INTRAVENOUS | Status: DC | PRN
  Administered 2010-11-08: 300 mL via INTRAVENOUS
  Administered 2010-11-08: 1000 mL via INTRAVENOUS

## 2010-11-08 MED ORDER — ACETAMINOPHEN 325 MG PO TABS: 650.0000 mg | ORAL_TABLET | ORAL | Status: DC | PRN

## 2010-11-08 MED ORDER — LACTATED RINGERS IV SOLN
INTRAVENOUS | Status: DC | PRN
  Administered 2010-11-08 (×3): via INTRAVENOUS

## 2010-11-08 MED ORDER — BUTORPHANOL TARTRATE 2 MG/ML IJ SOLN: 1.0000 mg | Freq: Once | INTRAMUSCULAR | Status: DC

## 2010-11-08 MED ORDER — EPHEDRINE 5 MG/ML INJ
10.0000 mg | INTRAVENOUS | Status: DC | PRN
  Filled 2010-11-08: qty 4

## 2010-11-08 MED ORDER — ONDANSETRON HCL 4 MG/2ML IJ SOLN
INTRAMUSCULAR | Status: AC
  Filled 2010-11-08: qty 2

## 2010-11-08 MED ORDER — IBUPROFEN 600 MG PO TABS
600.0000 mg | ORAL_TABLET | Freq: Four times a day (QID) | ORAL | Status: DC | PRN
  Administered 2010-11-08: 600 mg via ORAL
  Filled 2010-11-08: qty 1

## 2010-11-08 MED ORDER — LACTATED RINGERS IV SOLN: 500.0000 mL | Freq: Once | INTRAVENOUS | Status: DC

## 2010-11-08 MED ORDER — KETOROLAC TROMETHAMINE 60 MG/2ML IM SOLN
INTRAMUSCULAR | Status: AC
  Filled 2010-11-08: qty 2

## 2010-11-08 MED ORDER — TERBUTALINE SULFATE 1 MG/ML IJ SOLN: 0.2500 mg | Freq: Once | INTRAMUSCULAR | Status: AC | PRN

## 2010-11-08 MED ORDER — MORPHINE SULFATE 0.5 MG/ML IJ SOLN
INTRAMUSCULAR | Status: AC
  Filled 2010-11-08: qty 10

## 2010-11-08 MED ORDER — MEPERIDINE HCL 25 MG/ML IJ SOLN
INTRAMUSCULAR | Status: DC | PRN
  Administered 2010-11-08: 25 mg via INTRAVENOUS

## 2010-11-08 MED ORDER — PHENYLEPHRINE 40 MCG/ML (10ML) SYRINGE FOR IV PUSH (FOR BLOOD PRESSURE SUPPORT)
PREFILLED_SYRINGE | INTRAVENOUS | Status: AC
  Filled 2010-11-08: qty 5

## 2010-11-08 MED ORDER — FENTANYL 2.5 MCG/ML BUPIVACAINE 1/10 % EPIDURAL INFUSION (WH - ANES)
14.0000 mL/h | INTRAMUSCULAR | Status: DC
  Administered 2010-11-08 (×4): 14 mL/h via EPIDURAL
  Filled 2010-11-08 (×4): qty 60

## 2010-11-08 MED ORDER — KETOROLAC TROMETHAMINE 60 MG/2ML IM SOLN
60.0000 mg | Freq: Once | INTRAMUSCULAR | Status: AC | PRN
  Administered 2010-11-08: 60 mg via INTRAMUSCULAR

## 2010-11-08 MED ORDER — LACTATED RINGERS IV SOLN
INTRAVENOUS | Status: DC
  Administered 2010-11-08: 20:00:00 via INTRAVENOUS
  Administered 2010-11-08: 125 mL/h via INTRAVENOUS
  Administered 2010-11-08: 05:00:00 via INTRAVENOUS

## 2010-11-08 MED ORDER — PHENYLEPHRINE HCL 10 MG/ML IJ SOLN
INTRAMUSCULAR | Status: DC | PRN
  Administered 2010-11-08 (×3): 40 ug via INTRAVENOUS

## 2010-11-08 MED ORDER — OXYTOCIN BOLUS FROM INFUSION
500.0000 mL | Freq: Once | INTRAVENOUS | Status: DC
  Filled 2010-11-08: qty 500

## 2010-11-08 MED ORDER — LAMOTRIGINE 100 MG PO TABS
400.0000 mg | ORAL_TABLET | Freq: Every day | ORAL | Status: DC
  Administered 2010-11-08: 400 mg via ORAL
  Filled 2010-11-08 (×2): qty 2

## 2010-11-08 MED ORDER — OXYTOCIN 20 UNITS IN LACTATED RINGERS INFUSION - SIMPLE
INTRAVENOUS | Status: DC | PRN
  Administered 2010-11-08 (×2): 20 [IU] via INTRAVENOUS

## 2010-11-08 MED ORDER — CITRIC ACID-SODIUM CITRATE 334-500 MG/5ML PO SOLN
30.0000 mL | ORAL | Status: DC | PRN
  Administered 2010-11-08: 30 mL via ORAL
  Filled 2010-11-08: qty 15

## 2010-11-08 MED ORDER — MEPERIDINE HCL 25 MG/ML IJ SOLN: 6.2500 mg | INTRAMUSCULAR | Status: DC | PRN

## 2010-11-08 MED ORDER — LIDOCAINE HCL (PF) 1 % IJ SOLN
30.0000 mL | INTRAMUSCULAR | Status: DC | PRN
  Filled 2010-11-08: qty 30

## 2010-11-08 MED ORDER — PHENYLEPHRINE 40 MCG/ML (10ML) SYRINGE FOR IV PUSH (FOR BLOOD PRESSURE SUPPORT)
80.0000 ug | PREFILLED_SYRINGE | INTRAVENOUS | Status: DC | PRN
  Filled 2010-11-08: qty 5

## 2010-11-08 MED ORDER — CEFAZOLIN SODIUM 1-5 GM-% IV SOLN
INTRAVENOUS | Status: AC
  Filled 2010-11-08: qty 100

## 2010-11-08 MED ORDER — FLEET ENEMA 7-19 GM/118ML RE ENEM: 1.0000 | ENEMA | RECTAL | Status: DC | PRN

## 2010-11-08 MED ORDER — OXYTOCIN 20 UNITS IN LACTATED RINGERS INFUSION - SIMPLE
125.0000 mL/h | INTRAVENOUS | Status: AC
  Filled 2010-11-08: qty 1000

## 2010-11-08 MED ORDER — OXYTOCIN 20 UNITS IN LACTATED RINGERS INFUSION - SIMPLE
1.0000 m[IU]/min | INTRAVENOUS | Status: DC
  Administered 2010-11-08: 1 m[IU]/min via INTRAVENOUS

## 2010-11-08 MED ORDER — ONDANSETRON HCL 4 MG/2ML IJ SOLN: 4.0000 mg | Freq: Four times a day (QID) | INTRAMUSCULAR | Status: DC | PRN

## 2010-11-08 MED ORDER — OXYCODONE-ACETAMINOPHEN 5-325 MG PO TABS: 2.0000 | ORAL_TABLET | ORAL | Status: DC | PRN

## 2010-11-08 SURGICAL SUPPLY — 30 items
CHLORAPREP W/TINT 26ML (MISCELLANEOUS) ×2 IMPLANT
CLOTH BEACON ORANGE TIMEOUT ST (SAFETY) ×2 IMPLANT
CONTAINER PREFILL 10% NBF 15ML (MISCELLANEOUS) IMPLANT
DRSG COVADERM 4X8 (GAUZE/BANDAGES/DRESSINGS) ×1 IMPLANT
ELECT REM PT RETURN 9FT ADLT (ELECTROSURGICAL) ×2
ELECTRODE REM PT RTRN 9FT ADLT (ELECTROSURGICAL) ×1 IMPLANT
EXTRACTOR VACUUM BELL STYLE (SUCTIONS) IMPLANT
GLOVE BIO SURGEON STRL SZ7 (GLOVE) ×4 IMPLANT
GOWN PREVENTION PLUS LG XLONG (DISPOSABLE) ×5 IMPLANT
KIT ABG SYR 3ML LUER SLIP (SYRINGE) IMPLANT
NDL HYPO 25X5/8 SAFETYGLIDE (NEEDLE) ×1 IMPLANT
NEEDLE HYPO 25X5/8 SAFETYGLIDE (NEEDLE) IMPLANT
NS IRRIG 1000ML POUR BTL (IV SOLUTION) ×2 IMPLANT
PACK C SECTION WH (CUSTOM PROCEDURE TRAY) ×2 IMPLANT
RETRACTOR WND ALEXIS 25 LRG (MISCELLANEOUS) ×1 IMPLANT
RTRCTR WOUND ALEXIS 25CM LRG (MISCELLANEOUS) ×2
SLEEVE SCD COMPRESS KNEE MED (MISCELLANEOUS) IMPLANT
STAPLER VISISTAT 35W (STAPLE) ×1 IMPLANT
SUT MNCRL 0 VIOLET CTX 36 (SUTURE) ×2 IMPLANT
SUT MONOCRYL 0 CTX 36 (SUTURE) ×2
SUT PDS AB 0 CTX 60 (SUTURE) IMPLANT
SUT PLAIN 0 NONE (SUTURE) IMPLANT
SUT PLAIN 2 0 XLH (SUTURE) IMPLANT
SUT VIC AB 0 CT1 27 (SUTURE) ×4
SUT VIC AB 0 CT1 27XBRD ANBCTR (SUTURE) ×2 IMPLANT
SUT VIC AB 2-0 CT1 27 (SUTURE) ×2
SUT VIC AB 2-0 CT1 TAPERPNT 27 (SUTURE) ×1 IMPLANT
TOWEL OR 17X24 6PK STRL BLUE (TOWEL DISPOSABLE) ×4 IMPLANT
TRAY FOLEY CATH 14FR (SET/KITS/TRAYS/PACK) ×2 IMPLANT
WATER STERILE IRR 1000ML POUR (IV SOLUTION) ×1 IMPLANT

## 2010-11-08 NOTE — ED Notes (Signed)
Pt up to bathroom.

## 2010-11-08 NOTE — H&P (Signed)
  35 y.o. G1P0  Estimated Date of Delivery: 11/05/10 admitted at in early labor. Prenatal course complicated by: Epilepsy controlled with Lamictal, Hemochromatosis. Prenatal labs: Blood Type:B+.  Screening tests for HIV, Syphilis, Hepatitis B, Rubella sensitivity, fetal anomalies, gestational diabetes, and perineal group B strep colonization were all negative.    Afebrile, VSS Heart and Lungs: No active disease Abdomen: soft, gravid, EFW AGA. Cervical exam:  3/80  Impression: 40 wks-ctxs  Plan:  Admit for delivery

## 2010-11-08 NOTE — ED Notes (Signed)
Dr. Arlyce Dice notified of pt.  Orders rec'd for admission.

## 2010-11-08 NOTE — Anesthesia Procedure Notes (Signed)
Epidural Patient location during procedure: OB Start time: 11/08/2010 6:10 AM  Staffing Performed by: anesthesiologist   Preanesthetic Checklist Completed: patient identified, site marked, surgical consent, pre-op evaluation, timeout performed, IV checked, risks and benefits discussed and monitors and equipment checked  Epidural Patient position: sitting Prep: site prepped and draped and DuraPrep Patient monitoring: continuous pulse ox and blood pressure Approach: midline Injection technique: LOR air and LOR saline  Needle:  Needle type: Tuohy  Needle gauge: 17 G Needle length: 9 cm Needle insertion depth: 5 cm cm Catheter type: closed end flexible Catheter size: 19 Gauge Catheter at skin depth: 10 cm Test dose: negative  Assessment Events: blood not aspirated, injection not painful, no injection resistance, negative IV test and no paresthesia  Additional Notes Patient identified.  Risk benefits discussed including failed block, incomplete pain control, headache, nerve damage, paralysis, blood pressure changes, nausea, vomiting, reactions to medication both toxic or allergic, and postpartum back pain.  Patient expressed understanding and wished to proceed.  All questions were answered.  Sterile technique used throughout procedure and epidural site dressed with sterile barrier dressing. No paresthesia or other complications noted.The patient did not experience any signs of intravascular injection such as tinnitus or metallic taste in mouth nor signs of intrathecal spread such as rapid motor block. Please see nursing notes for vital signs.

## 2010-11-08 NOTE — Op Note (Signed)
11/08/2010  8:59 PM  PATIENT:  Molly Quinn  35 y.o. female  PRE-OPERATIVE DIAGNOSIS:  Arrest of Active Phase  POST-OPERATIVE DIAGNOSIS:  Arrest of Active Phase  PROCEDURE:  Procedure(s): CESAREAN SECTION  SURGEON:  Surgeon(s): Sameka Bagent A Deannie Resetar  PHYSICIAN ASSISTANT:   ASSISTANTS: none   ANESTHESIA:   epidural  ESTIMATED BLOOD LOSS: 900 cc  IVF:  1800 cc  UOP:  200 cc  BLOOD ADMINISTERED:none  MEDICATIONS USED: Ancef and Pitocin  SPECIMEN:  No Specimen  DISPOSITION OF SPECIMEN:  N/A  COUNTS:  YES  PATIENT DISPOSITION:  PACU - hemodynamically stable.   Delay start of Pharmacological VTE agent (>24hrs) due to surgical blood loss or risk of bleeding:  not applicable  Complications:  Small 1 cm laceration of baby's cheek with scalpel when uterus was opened despite carefully and slowly making incision.  D/W parents.  Findings:  Baby female, Apgars 9,9, weight 7#10.   Normal tubes, ovaries and uterus seen.  Technique:  After adequate epidural anesthesia was achieved, the patient was prepped and draped in usual sterile fashion.  A foley catheter was used to drain the bladder.  A pfannanstiel incision was made with the scalpel and carried down to the fascia with the bovie cautery. The fascia was incised in the midline with the scalpel and carried in a transverse curvilinear manner bilaterally.  The fascia was reflected superiorly and inferiorly off the rectus muscles and the muscles split in the midline.  A bowel free portion of the peritoneum was entered bluntly and then extended in a superior and inferior manner with good visualization of the bowel and bladder.  The Alexis instrument was then placed and the vesico-uterine fascia tented up and incised in a transverse curvilinear manner.  A 2 cm incision was made in the upper portion of the lower uterine segment until the amnion was exposed.  Clear slightly blood-tinged fluid was noted and the baby delivered in the vertex  presentation without complication.  The baby was bulb suctioned and the cord was clamped and cut.  The baby was then handed to awaiting Neonatology.  The placenta was then delivered manually and the uterus cleared of all debris.  The uterine incision was then closed with a running lock stitch of 0 monocryl.  An imbricating layer of 0 monocryl was closed as well. Good hemostasis of the uterine incision was achieved, the abdomen was cleared with irrigation and the peritoneum was closed with a running stitch of 2-0 vicryl.  This incorporated the rectus muscles as a separate layer.  The fascia was then closed with a running stitch of 0 vicryl.  The subcutaneous layer was closed with interrupted  stitches of 2-0 plain gut.  The skin was closed with staples.  The patient tolerated the procedure well and was returned to the recovery room in stable condition.  All counts were correct times three.  Dotsie Gillette A                 

## 2010-11-08 NOTE — Progress Notes (Signed)
HURT BAD AT 8PM.  VE ON Thursday- 2 CM  . DENIES HSV AND MRSA.

## 2010-11-08 NOTE — Consult Note (Signed)
Neonatology Note:   Attendance at C-section:    I was asked to attend this primary C/S at term due to [facility]. The mother is a G1P0 B pos, GBS neg with slightly bloody fluid. Infant vigorous with good spontaneous cry and tone. Needed only minimal bulb suctioning. Ap 9/10. PE remarkable for a 1 cm superficial laceration on right cheek, parents aware in OR. To CN to care of Pediatrician.   Deatra James, MD

## 2010-11-08 NOTE — Anesthesia Postprocedure Evaluation (Signed)
Anesthesia Post Note  Patient: Molly Quinn  Procedure(s) Performed:  CESAREAN SECTION  Anesthesia type: Epidural  Patient location: PACU  Post pain: Pain level controlled  Post assessment: Post-op Vital signs reviewed  Last Vitals:  Filed Vitals:   11/08/10 2139  BP: 109/57  Pulse: 63  Temp: 97.8 F (36.6 C)  Resp: 18    Post vital signs: stable  Level of consciousness: awake  Complications: No apparent anesthesia complications

## 2010-11-08 NOTE — Progress Notes (Signed)
Pt comfortable.  FHTS NST R.  Toco q 3 SVE 6-7/80/-2, swollen cervix and bloody fluid on exam now.  A/P  For urgent C/S secondary bloody fluid and AOAP.  All R/B/Alt d/w pt.  Donzell Coller A

## 2010-11-08 NOTE — Progress Notes (Signed)
Pt has epidural

## 2010-11-08 NOTE — Anesthesia Preprocedure Evaluation (Signed)
Anesthesia Evaluation  Name, MR# and DOB Patient awake  General Assessment Comment  Reviewed: Allergy & Precautions, H&P , Patient's Chart, lab work & pertinent test results  Airway Mallampati: II TM Distance: >3 FB Neck ROM: full    Dental No notable dental hx.    Pulmonary  clear to auscultation  pulmonary exam normalPulmonary Exam Normal breath sounds clear to auscultation none    Cardiovascular regular Normal    Neuro/Psych Negative Neurological ROS  Negative Psych ROS  GI/Hepatic/Renal negative GI ROS  negative Liver ROS  negative Renal ROS        Endo/Other  Negative Endocrine ROS (+)      Abdominal   Musculoskeletal   Hematology negative hematology ROS (+)   Peds  Reproductive/Obstetrics (+) Pregnancy    Anesthesia Other Findings hemochromotosis Obesity Seizure disorder            Anesthesia Physical Anesthesia Plan  ASA: III  Anesthesia Plan: Epidural   Post-op Pain Management:    Induction:   Airway Management Planned:   Additional Equipment:   Intra-op Plan:   Post-operative Plan:   Informed Consent: I have reviewed the patients History and Physical, chart, labs and discussed the procedure including the risks, benefits and alternatives for the proposed anesthesia with the patient or authorized representative who has indicated his/her understanding and acceptance.     Plan Discussed with:   Anesthesia Plan Comments:         Anesthesia Quick Evaluation

## 2010-11-08 NOTE — Transfer of Care (Signed)
  Anesthesia Post-op Note  Patient: Molly Quinn  Procedure(s) Performed:  CESAREAN SECTION  Patient Location: PACU  Anesthesia Type: Epidural  Level of Consciousness: awake, alert , oriented and patient cooperative  Airway and Oxygen Therapy: Patient Spontanous Breathing  Post-op Pain: none  Post-op Assessment: Post-op Vital signs reviewed, Patient's Cardiovascular Status Stable and Pain level controlled  Post-op Vital Signs: Reviewed and stable  Complications: No apparent anesthesia complications

## 2010-11-08 NOTE — Telephone Encounter (Signed)
Preadmission screen  

## 2010-11-08 NOTE — Progress Notes (Signed)
Filed Vitals:   11/08/10 0800  BP: 124/69  Pulse: 73  Temp:   Resp:    NST R SVE 7-8/C/-2 AROM uncertain if light mec.  Expect SVD.  Braden Deloach A

## 2010-11-08 NOTE — Brief Op Note (Addendum)
11/08/2010  8:59 PM  PATIENT:  Molly Quinn  35 y.o. female  PRE-OPERATIVE DIAGNOSIS:  Arrest of Active Phase  POST-OPERATIVE DIAGNOSIS:  Arrest of Active Phase  PROCEDURE:  Procedure(s): CESAREAN SECTION  SURGEON:  Surgeon(s): Molly Quinn  PHYSICIAN ASSISTANT:   ASSISTANTS: none   ANESTHESIA:   epidural  ESTIMATED BLOOD LOSS: 900 cc  IVF:  1800 cc  UOP:  200 cc  BLOOD ADMINISTERED:none  MEDICATIONS USED: Ancef and Pitocin  SPECIMEN:  No Specimen  DISPOSITION OF SPECIMEN:  N/A  COUNTS:  YES  PATIENT DISPOSITION:  PACU - hemodynamically stable.   Delay start of Pharmacological VTE agent (>24hrs) due to surgical blood loss or risk of bleeding:  not applicable  Complications:  Small 1 cm laceration of baby's cheek with scalpel when uterus was opened despite carefully and slowly making incision.  D/W parents.  Findings:  Baby female, Apgars 9,9, weight 7#10.   Normal tubes, ovaries and uterus seen.  Technique:  After adequate epidural anesthesia was achieved, the patient was prepped and draped in usual sterile fashion.  A foley catheter was used to drain the bladder.  A pfannanstiel incision was made with the scalpel and carried down to the fascia with the bovie cautery. The fascia was incised in the midline with the scalpel and carried in a transverse curvilinear manner bilaterally.  The fascia was reflected superiorly and inferiorly off the rectus muscles and the muscles split in the midline.  A bowel free portion of the peritoneum was entered bluntly and then extended in a superior and inferior manner with good visualization of the bowel and bladder.  The Alexis instrument was then placed and the vesico-uterine fascia tented up and incised in a transverse curvilinear manner.  A 2 cm incision was made in the upper portion of the lower uterine segment until the amnion was exposed.  Clear slightly blood-tinged fluid was noted and the baby delivered in the vertex  presentation without complication.  The baby was bulb suctioned and the cord was clamped and cut.  The baby was then handed to awaiting Neonatology.  The placenta was then delivered manually and the uterus cleared of all debris.  The uterine incision was then closed with a running lock stitch of 0 monocryl.  An imbricating layer of 0 monocryl was closed as well. Good hemostasis of the uterine incision was achieved, the abdomen was cleared with irrigation and the peritoneum was closed with a running stitch of 2-0 vicryl.  This incorporated the rectus muscles as a separate layer.  The fascia was then closed with a running stitch of 0 vicryl.  The subcutaneous layer was closed with interrupted  stitches of 2-0 plain gut.  The skin was closed with staples.  The patient tolerated the procedure well and was returned to the recovery room in stable condition.  All counts were correct times three.  Danell Verno A

## 2010-11-08 NOTE — Progress Notes (Signed)
[place] at beside. C-section called. Henderson Cloud informed patient about the procedure, risks, and benefits. Patient verbalizes understanding. Informed consent signed.

## 2010-11-08 NOTE — Progress Notes (Signed)
Dr. Henderson Cloud called to check in on patient. Updated on last SVE. Dr. Henderson Cloud said that she will be on the unit shortly to check in on the patient.

## 2010-11-09 ENCOUNTER — Inpatient Hospital Stay (HOSPITAL_COMMUNITY): Payer: BC Managed Care – PPO

## 2010-11-09 ENCOUNTER — Other Ambulatory Visit: Payer: Self-pay

## 2010-11-09 LAB — CBC
HCT: 26 % — ABNORMAL LOW (ref 36.0–46.0)
HCT: 24.8 % — ABNORMAL LOW (ref 36.0–46.0)
Hemoglobin: 8.9 g/dL — ABNORMAL LOW (ref 12.0–15.0)
Hemoglobin: 8.4 g/dL — ABNORMAL LOW (ref 12.0–15.0)
MCH: 30.4 pg (ref 26.0–34.0)
MCHC: 34.2 g/dL (ref 30.0–36.0)
MCV: 88.7 fL (ref 78.0–100.0)
RBC: 2.77 MIL/uL — ABNORMAL LOW (ref 3.87–5.11)
WBC: 13.5 10*3/uL — ABNORMAL HIGH (ref 4.0–10.5)

## 2010-11-09 LAB — COMPREHENSIVE METABOLIC PANEL
Albumin: 2 g/dL — ABNORMAL LOW (ref 3.5–5.2)
BUN: 13 mg/dL (ref 6–23)
Calcium: 7.8 mg/dL — ABNORMAL LOW (ref 8.4–10.5)
Creatinine, Ser: 0.87 mg/dL (ref 0.50–1.10)
GFR calc Af Amer: >60 mL/min (ref >60)
Potassium: 4 mEq/L (ref 3.5–5.1)
Total Protein: 4.4 g/dL — ABNORMAL LOW (ref 6.0–8.3)

## 2010-11-09 LAB — MRSA PCR SCREENING: MRSA by PCR: NEGATIVE

## 2010-11-09 MED ORDER — KETOROLAC TROMETHAMINE 30 MG/ML IJ SOLN: 30.0000 mg | Freq: Four times a day (QID) | INTRAMUSCULAR | Status: AC | PRN

## 2010-11-09 MED ORDER — MAGNESIUM HYDROXIDE 400 MG/5ML PO SUSP: 30.0000 mL | ORAL | Status: DC | PRN

## 2010-11-09 MED ORDER — METHYLERGONOVINE MALEATE 0.2 MG PO TABS: 0.2000 mg | ORAL_TABLET | ORAL | Status: DC | PRN

## 2010-11-09 MED ORDER — ONDANSETRON HCL 4 MG/2ML IJ SOLN
4.0000 mg | INTRAMUSCULAR | Status: DC | PRN
  Administered 2010-11-09: 4 mg via INTRAVENOUS
  Filled 2010-11-09: qty 2

## 2010-11-09 MED ORDER — ONDANSETRON HCL 4 MG PO TABS: 4.0000 mg | ORAL_TABLET | ORAL | Status: DC | PRN

## 2010-11-09 MED ORDER — SODIUM CHLORIDE 0.9 % IV SOLN
1.0000 ug/kg/h | INTRAVENOUS | Status: DC | PRN
  Filled 2010-11-09: qty 2.5

## 2010-11-09 MED ORDER — WITCH HAZEL-GLYCERIN EX PADS: 1.0000 "application " | MEDICATED_PAD | CUTANEOUS | Status: DC | PRN

## 2010-11-09 MED ORDER — IBUPROFEN 600 MG PO TABS
600.0000 mg | ORAL_TABLET | Freq: Four times a day (QID) | ORAL | Status: DC | PRN
  Administered 2010-11-09 (×2): 600 mg via ORAL
  Filled 2010-11-09 (×2): qty 1

## 2010-11-09 MED ORDER — DIPHENHYDRAMINE HCL 25 MG PO CAPS: 25.0000 mg | ORAL_CAPSULE | Freq: Four times a day (QID) | ORAL | Status: DC | PRN

## 2010-11-09 MED ORDER — SODIUM CHLORIDE 0.9 % IJ SOLN: 3.0000 mL | INTRAMUSCULAR | Status: DC | PRN

## 2010-11-09 MED ORDER — OXYCODONE-ACETAMINOPHEN 5-325 MG PO TABS
1.0000 | ORAL_TABLET | ORAL | Status: DC | PRN
  Administered 2010-11-09 – 2010-11-10 (×5): 1 via ORAL
  Administered 2010-11-11: 2 via ORAL
  Filled 2010-11-09 (×3): qty 1
  Filled 2010-11-09: qty 2
  Filled 2010-11-09 (×2): qty 1

## 2010-11-09 MED ORDER — FERROUS SULFATE 325 (65 FE) MG PO TABS
325.0000 mg | ORAL_TABLET | Freq: Two times a day (BID) | ORAL | Status: DC
  Filled 2010-11-09 (×2): qty 1

## 2010-11-09 MED ORDER — MEASLES, MUMPS & RUBELLA VAC ~~LOC~~ INJ
0.5000 mL | INJECTION | Freq: Once | SUBCUTANEOUS | Status: DC
  Filled 2010-11-09: qty 0.5

## 2010-11-09 MED ORDER — METOCLOPRAMIDE HCL 5 MG/ML IJ SOLN: 10.0000 mg | Freq: Three times a day (TID) | INTRAMUSCULAR | Status: DC | PRN

## 2010-11-09 MED ORDER — OXYTOCIN 20 UNITS IN LACTATED RINGERS INFUSION - SIMPLE
125.0000 mL/h | INTRAVENOUS | Status: DC | PRN
  Filled 2010-11-09: qty 1000

## 2010-11-09 MED ORDER — DIPHENHYDRAMINE HCL 25 MG PO CAPS: 25.0000 mg | ORAL_CAPSULE | ORAL | Status: DC | PRN

## 2010-11-09 MED ORDER — ZOLPIDEM TARTRATE 5 MG PO TABS: 5.0000 mg | ORAL_TABLET | Freq: Every evening | ORAL | Status: DC | PRN

## 2010-11-09 MED ORDER — DIPHENHYDRAMINE HCL 50 MG/ML IJ SOLN: 12.5000 mg | INTRAMUSCULAR | Status: DC | PRN

## 2010-11-09 MED ORDER — METHYLERGONOVINE MALEATE 0.2 MG/ML IJ SOLN: 0.2000 mg | INTRAMUSCULAR | Status: DC | PRN

## 2010-11-09 MED ORDER — SENNOSIDES-DOCUSATE SODIUM 8.6-50 MG PO TABS
2.0000 | ORAL_TABLET | Freq: Every day | ORAL | Status: DC
  Administered 2010-11-09 – 2010-11-10 (×2): 2 via ORAL

## 2010-11-09 MED ORDER — DIBUCAINE 1 % RE OINT: 1.0000 "application " | TOPICAL_OINTMENT | RECTAL | Status: DC | PRN

## 2010-11-09 MED ORDER — ONDANSETRON HCL 4 MG/2ML IJ SOLN: 4.0000 mg | Freq: Three times a day (TID) | INTRAMUSCULAR | Status: DC | PRN

## 2010-11-09 MED ORDER — NON FORMULARY: 300.0000 mg | Freq: Every day | Status: DC

## 2010-11-09 MED ORDER — LACTATED RINGERS IV BOLUS (SEPSIS): 500.0000 mL | Freq: Once | INTRAVENOUS | Status: DC

## 2010-11-09 MED ORDER — LANOLIN HYDROUS EX OINT: TOPICAL_OINTMENT | CUTANEOUS | Status: DC | PRN

## 2010-11-09 MED ORDER — PRENATAL PLUS 27-1 MG PO TABS
1.0000 | ORAL_TABLET | Freq: Every day | ORAL | Status: DC
  Filled 2010-11-09 (×2): qty 1

## 2010-11-09 MED ORDER — SCOPOLAMINE 1 MG/3DAYS TD PT72
1.0000 | MEDICATED_PATCH | Freq: Once | TRANSDERMAL | Status: DC
  Filled 2010-11-09: qty 1

## 2010-11-09 MED ORDER — LAMOTRIGINE ER 100 MG PO TB24
300.0000 mg | ORAL_TABLET | Freq: Every day | ORAL | Status: DC
  Administered 2010-11-09 – 2010-11-10 (×2): 300 mg via ORAL
  Filled 2010-11-09 (×4): qty 3

## 2010-11-09 MED ORDER — SODIUM CHLORIDE 0.9 % IV SOLN
250.0000 mL | INTRAVENOUS | Status: DC
  Administered 2010-11-09: 1000 mL via INTRAVENOUS

## 2010-11-09 MED ORDER — FUROSEMIDE 10 MG/ML IJ SOLN
10.0000 mg | Freq: Once | INTRAMUSCULAR | Status: AC
  Administered 2010-11-09: 10 mg via INTRAVENOUS
  Filled 2010-11-09: qty 2

## 2010-11-09 MED ORDER — NALOXONE HCL 0.4 MG/ML IJ SOLN: 0.4000 mg | INTRAMUSCULAR | Status: DC | PRN

## 2010-11-09 MED ORDER — BENZOCAINE-MENTHOL 20-0.5 % EX AERO: 1.0000 "application " | INHALATION_SPRAY | CUTANEOUS | Status: DC | PRN

## 2010-11-09 MED ORDER — DIPHENHYDRAMINE HCL 50 MG/ML IJ SOLN: 25.0000 mg | INTRAMUSCULAR | Status: DC | PRN

## 2010-11-09 MED ORDER — SODIUM CHLORIDE 0.9 % IV SOLN
INTRAVENOUS | Status: DC
  Administered 2010-11-09 – 2010-11-10 (×6): via INTRAVENOUS

## 2010-11-09 MED ORDER — NALBUPHINE HCL 10 MG/ML IJ SOLN
5.0000 mg | INTRAMUSCULAR | Status: DC | PRN
  Filled 2010-11-09: qty 1

## 2010-11-09 MED ORDER — TETANUS-DIPHTH-ACELL PERTUSSIS 5-2.5-18.5 LF-MCG/0.5 IM SUSP
0.5000 mL | Freq: Once | INTRAMUSCULAR | Status: AC
  Administered 2010-11-10: 0.5 mL via INTRAMUSCULAR
  Filled 2010-11-09: qty 0.5

## 2010-11-09 MED ORDER — SODIUM CHLORIDE 0.9 % IJ SOLN
3.0000 mL | Freq: Two times a day (BID) | INTRAMUSCULAR | Status: DC
  Administered 2010-11-10: 3 mL via INTRAVENOUS

## 2010-11-09 MED ORDER — SODIUM CHLORIDE 0.9 % IV BOLUS (SEPSIS)
1000.0000 mL | Freq: Once | INTRAVENOUS | Status: AC
  Administered 2010-11-09: 1000 mL via INTRAVENOUS

## 2010-11-09 MED ORDER — IBUPROFEN 800 MG PO TABS
800.0000 mg | ORAL_TABLET | Freq: Three times a day (TID) | ORAL | Status: DC
  Administered 2010-11-10 (×4): 800 mg via ORAL
  Filled 2010-11-09 (×4): qty 1

## 2010-11-09 MED ORDER — SIMETHICONE 80 MG PO CHEW: 80.0000 mg | CHEWABLE_TABLET | ORAL | Status: DC | PRN

## 2010-11-09 NOTE — Progress Notes (Signed)
UR chart review completed.  

## 2010-11-09 NOTE — Addendum Note (Signed)
Addendum  created 11/09/10 0816 by Madison Hickman   Modules edited:Notes Section

## 2010-11-09 NOTE — Progress Notes (Signed)
As patient would fall asleep o2 and hr would drop.  Low of 62 % and hr of 46.  Ac and rapid response called to follow up with patient.  Dr Henderson Cloud notified with information.  Patient informed of new orders and change of movement to aicu.

## 2010-11-09 NOTE — Progress Notes (Signed)
Encounter addended by: Madison Hickman on: 11/09/2010  8:16 AM<BR>     Documentation filed: Notes Section

## 2010-11-09 NOTE — Progress Notes (Addendum)
CTSP  Nurses state that UOP was 150 for 5 hours and pt was given 500 cc bolus.  They called again and stated that everytime pt dozes her SaO2 would drop to 80s and HR would drop to 40s.  Pt vomitted once at 3:30 am.  Pt put on 2l O2 and SaO2 would still drop when dozing.  Now on 3l O2 .  Urine output still not adequate and appears concentrated.  Pt states that she feels SOB only when waking up.  Otherwise no SOB or chest pain.  C/o slight dizzyness.  Filed Vitals:   11/09/10 0540 11/09/10 0545 11/09/10 0600 11/09/10 0607  BP:  128/72    Pulse: 50 58    Temp:      TempSrc:      Resp:  17    Height:      Weight:      SpO2: 95% 98% 82% 100%    Lungs:  Sl crackles that clear on breathing bilaterally Abd:  Soft appro tender, incision intact, FF Cor:  RRR, no mrg Ex: 2+ PE feet, SCDs on Perineum:   No active bleeding.  CMET normal Lab Results  Component Value Date   WBC 18.1* 11/09/2010   HGB 8.9* 11/09/2010   HCT 26.0* 11/09/2010   MCV 88.7 11/09/2010   PLT 191 11/09/2010   A/P in AICU.  CXR done- report pending.  If pulm edema, give lasix.  EKG pending.  Continue O2.

## 2010-11-09 NOTE — Progress Notes (Signed)
Subjective: Postpartum Day 1: Cesarean Delivery Patient reports feeling sleepy. Pain is controlled. Pt denies SOB or chest pain  Objective: Vital signs in last 24 hours: Temp:  [97.5 F (36.4 C)-99.4 F (37.4 C)] 97.6 F (36.4 C) (08/29 0715) Pulse Rate:  [46-112] 62  (08/29 0915) Resp:  [12-20] 14  (08/29 0900) BP: (95-130)/(55-92) 102/57 mmHg (08/29 0915) SpO2:  [68 %-100 %] 99 % (08/29 0915) Weight:  [89.177 kg (196 lb 9.6 oz)] 196 lb 9.6 oz (89.177 kg) (08/29 0715) UOP has been poor since surgery. In Progress notes it states a IVF bolus was given however its not charted in RN notes.  EBL per op note 900cc  Physical Exam:  General: alert, fatigued and pale Lungs CTAB, no crackles or wheezes Lochia: appropriate Uterine Fundus: firm Incision: dressing intact and unsoiled DVT Evaluation: No evidence of DVT seen on physical exam. SCDs in place   [facility] 11/09/10 0545 11/08/10 0505  HGB 8.9* 13.4  HCT 26.0* 39.0    Assessment/Plan: Status post Cesarean section.  Decreased UOP since delivery Pt has been dropping O2 sats while sleeping. CXR does not show evidence of pulmonary edema. EBL 900 but hgb dropped from 13.4 preop to 8.9 post-op. Progress Notes state pt received IVF bolus but RN notes dont reflect bolus given. I&Os in chart indicate pt 3L ahead in fluids. Due to this and note of IVF bolus pt did receive lasix 10mg  IV x 1. UOP was 125cc in response. Will monitor UOP. Pt may truly be intravascularly deplete and require IVF bolusing. Will recheck CBC @ 1700 today.  Continue current care.  Molly Quinn H. 11/09/2010, 10:51 AM

## 2010-11-09 NOTE — Anesthesia Postprocedure Evaluation (Signed)
  Anesthesia Post-op Note  Patient: Molly Quinn  Procedure(s) Performed:  CESAREAN SECTION  Patient Location: PACU and A-ICU  Anesthesia Type: Epidural  Level of Consciousness: awake, alert  and oriented  Airway and Oxygen Therapy: Patient Spontanous Breathing and Patient connected to nasal cannula oxygen  Post-op Pain: none  Post-op Assessment: Post-op Vital signs reviewed and Patient's Cardiovascular Status Stable  Post-op Vital Signs: Reviewed and stable  Complications: No apparent anesthesia complications

## 2010-11-09 NOTE — Progress Notes (Signed)
Called patients neurologist Dr. Tera Helper regarding her seizure medication.  Dr. Tera Helper said to continue her lamictical and to decrease the dosage to 300 mg daily.  The patient had been on 400 mg prior to delivery, but Dr. Tera Helper states, "  After pregnancy we need to decrease her dosage by 25% which would be 300 mg."  Orders were written per her recommendations and she also wants the patient to follow-up with her in 6 weeks.

## 2010-11-10 LAB — BASIC METABOLIC PANEL
CO2: 25 mEq/L (ref 19–32)
Calcium: 7.8 mg/dL — ABNORMAL LOW (ref 8.4–10.5)
Chloride: 108 mEq/L (ref 96–112)
Creatinine, Ser: 0.65 mg/dL (ref 0.50–1.10)
Glucose, Bld: 81 mg/dL (ref 70–99)

## 2010-11-10 LAB — CBC
Hemoglobin: 7.7 g/dL — ABNORMAL LOW (ref 12.0–15.0)
MCH: 31 pg (ref 26.0–34.0)
MCV: 89.9 fL (ref 78.0–100.0)
RBC: 2.48 MIL/uL — ABNORMAL LOW (ref 3.87–5.11)
WBC: 11.7 10*3/uL — ABNORMAL HIGH (ref 4.0–10.5)

## 2010-11-10 NOTE — Progress Notes (Signed)
Doing much better today.  VS stable, Urine output good.  Will be bottle feeding.  CBC today:  7.7/11/7, Platelets 166K.  Dressing dry.  Will d/c IVF and foley as day progresses.  Will ambulate, shower, etc.  Labs in AM 8/31.  Keep in ICU today.

## 2010-11-10 NOTE — Progress Notes (Signed)
Pt medicated for c/o  Abdominal discomfort after holding baby.  Dad supportive and helpful at [facility].

## 2010-11-10 NOTE — Progress Notes (Signed)
Minimal pain relief - given 2nd Percocet per erquest

## 2010-11-11 LAB — DIFFERENTIAL
Basophils Absolute: 0 10*3/uL (ref 0.0–0.1)
Eosinophils Relative: 2 % (ref 0–5)
Lymphocytes Relative: 18 % (ref 12–46)
Lymphs Abs: 1.8 10*3/uL (ref 0.7–4.0)
Neutro Abs: 7.4 10*3/uL (ref 1.7–7.7)
Neutrophils Relative %: 75 % (ref 43–77)

## 2010-11-11 LAB — COMPREHENSIVE METABOLIC PANEL
ALT: 9 U/L (ref 0–35)
BUN: 6 mg/dL (ref 6–23)
CO2: 25 mEq/L (ref 19–32)
Calcium: 8.4 mg/dL (ref 8.4–10.5)
Creatinine, Ser: 0.63 mg/dL (ref 0.50–1.10)
GFR calc Af Amer: >60 mL/min (ref >60)
GFR calc non Af Amer: >60 mL/min (ref >60)
Glucose, Bld: 69 mg/dL — ABNORMAL LOW (ref 70–99)
Total Protein: 4.3 g/dL — ABNORMAL LOW (ref 6.0–8.3)

## 2010-11-11 LAB — CBC
HCT: 21.8 % — ABNORMAL LOW (ref 36.0–46.0)
Hemoglobin: 7.3 g/dL — ABNORMAL LOW (ref 12.0–15.0)
MCH: 29.8 pg (ref 26.0–34.0)
MCHC: 33.5 g/dL (ref 30.0–36.0)
MCV: 89 fL (ref 78.0–100.0)
RBC: 2.45 MIL/uL — ABNORMAL LOW (ref 3.87–5.11)

## 2010-11-11 MED ORDER — OXYCODONE-ACETAMINOPHEN 5-325 MG PO TABS: 1.0000 | ORAL_TABLET | ORAL | Status: AC | PRN

## 2010-11-11 NOTE — Discharge Summary (Signed)
Obstetric Discharge Summary Reason for Admission: onset of labor Prenatal Procedures: ultrasound Intrapartum Procedures: cesarean: low cervical, transverse Postpartum Procedures: none Complications-Operative and Postpartum: Bradycardia, SOB post op.  Observed in AICU.  No obvious reason found HGB  Date Value Range Status  05/02/2010 12.7  11.6-15.9 (g/dL) Final     Hemoglobin  Date Value Range Status  11/11/2010 7.3* 12.0-15.0 (g/dL) Final     HCT  Date Value Range Status  11/11/2010 21.8* 36.0-46.0 (%) Final  05/02/2010 36.5  34.8-46.6 (%) Final    Discharge Diagnoses: Term Pregnancy-delivered and arrest in active labor  Discharge Information: Date: 11/11/2010 Activity: Limited Diet: routine Medications: Ibuprophen and Percocet Condition: stable Instructions: refer to practice specific booklet Discharge to: home Follow-up Information    Follow up with HORVATH,MICHELLE A. Make an appointment in 4 weeks.   Contact information:   [address] [place] [address] [phone]          Newborn Data: Live born female  Birth Weight: 7 lb 10.1 oz (3460 g) APGAR: 9, 10  Home with mother.  Molly Quinn D 11/11/2010, 10:07 AM

## 2010-11-11 NOTE — Progress Notes (Signed)
Post Partum Day 3 Subjective: up ad lib, voiding, tolerating PO and + flatus  Objective: Blood pressure 117/68, pulse 72, temperature 98.4 F (36.9 C), temperature source Oral, resp. rate 16, height 5\' 3"  (1.6 m), weight 89.177 kg (196 lb 9.6 oz), last menstrual period 11/09/2010, SpO2 98.00%.  Physical Exam:  General: alert Lochia: appropriate Uterine Fundus: firm Incision: healing well   Basename 11/11/10 0515 11/10/10 0545  HGB 7.3* 7.7*  HCT 21.8* 22.3*    Assessment/Plan: Discharge home   LOS: 3 days   KAPLAN,RICHARD D 11/11/2010, 10:00 AM

## 2010-11-11 NOTE — Progress Notes (Signed)
Patient was referred for history of depression/anxiety. * Referral screened out by Clinical Social Worker because none of the following criteria appear to apply:  ~ History of anxiety/depression during this pregnancy, or of post-partum       depression.  ~ Diagnosis of anxiety and/or depression within last 3 years  ~ History of depression due to pregnancy loss/loss of child  OR * Patient's symptoms currently being treated with medication and/or therapy.  Please contact the Clinical Social Worker if needs arise, or by the patient's request.  Pt was depressed in her 20's but no symptoms since then, as per pt.

## 2010-11-14 ENCOUNTER — Inpatient Hospital Stay (HOSPITAL_COMMUNITY): Admission: RE | Admit: 2010-11-14 | Payer: BC Managed Care – PPO | Source: Ambulatory Visit

## 2010-11-22 ENCOUNTER — Encounter (HOSPITAL_COMMUNITY): Payer: Self-pay | Admitting: Obstetrics and Gynecology

## 2010-12-05 ENCOUNTER — Encounter (HOSPITAL_BASED_OUTPATIENT_CLINIC_OR_DEPARTMENT_OTHER): Payer: BC Managed Care – PPO | Admitting: Oncology

## 2010-12-05 ENCOUNTER — Other Ambulatory Visit: Payer: Self-pay | Admitting: Oncology

## 2010-12-05 DIAGNOSIS — G40909 Epilepsy, unspecified, not intractable, without status epilepticus: Secondary | ICD-10-CM

## 2010-12-05 DIAGNOSIS — Z9889 Other specified postprocedural states: Secondary | ICD-10-CM

## 2010-12-05 LAB — CBC WITH DIFFERENTIAL/PLATELET
BASO%: 0.8 % (ref 0.0–2.0)
Eosinophils Absolute: 0.1 10*3/uL (ref 0.0–0.5)
HCT: 35.6 % (ref 34.8–46.6)
MCHC: 33.4 g/dL (ref 31.5–36.0)
MONO#: 0.3 10*3/uL (ref 0.1–0.9)
NEUT#: 4.3 10*3/uL (ref 1.5–6.5)
NEUT%: 60.8 % (ref 38.4–76.8)
RBC: 4.08 10*6/uL (ref 3.70–5.45)
WBC: 7 10*3/uL (ref 3.9–10.3)
lymph#: 2.2 10*3/uL (ref 0.9–3.3)

## 2010-12-05 LAB — FERRITIN: Ferritin: 14 ng/mL (ref 10–291)

## 2010-12-09 LAB — CREATININE, SERUM
Creatinine, Ser: 0.87
GFR calc Af Amer: >60
GFR calc non Af Amer: >60

## 2011-04-15 ENCOUNTER — Telehealth: Payer: Self-pay | Admitting: Oncology

## 2011-04-15 NOTE — Telephone Encounter (Signed)
Called pt, left message pt has lab on March 19th then see md and lab on September 2013

## 2011-05-30 ENCOUNTER — Other Ambulatory Visit (HOSPITAL_BASED_OUTPATIENT_CLINIC_OR_DEPARTMENT_OTHER): Payer: BC Managed Care – PPO

## 2011-05-30 LAB — CBC WITH DIFFERENTIAL/PLATELET
BASO%: 0.5 % (ref 0.0–2.0)
EOS%: 0 % (ref 0.0–7.0)
MCH: 29.9 pg (ref 25.1–34.0)
MCV: 86.4 fL (ref 79.5–101.0)
MONO%: 5.7 % (ref 0.0–14.0)
RBC: 4.82 10*6/uL (ref 3.70–5.45)
RDW: 14.1 % (ref 11.2–14.5)

## 2011-05-30 LAB — FERRITIN: Ferritin: 11 ng/mL (ref 10–291)

## 2011-06-01 ENCOUNTER — Telehealth: Payer: Self-pay | Admitting: *Deleted

## 2011-06-01 NOTE — Telephone Encounter (Signed)
Called pt with lab results, she verbalized understanding.

## 2011-06-01 NOTE — Telephone Encounter (Signed)
Message copied by Caleb Popp on Thu Jun 01, 2011  1:03 PM ------      Message from: Thornton Papas B      Created: Wed May 31, 2011  5:55 PM       Please call patient, HB AND FERRITIN ARE OK, F/U AS SCHEDULED

## 2011-08-02 ENCOUNTER — Telehealth: Payer: Self-pay | Admitting: *Deleted

## 2011-08-02 NOTE — Telephone Encounter (Signed)
Call from pt reporting she has developed dark pink/ red spots on abdomen and chest. Remembers Dr. Truett Perna mentioning this in the past. Does she need to do anything about it?  Reviewed with Dr. Truett Perna: No, OK to wait until office visit in September. Instructed pt to bring them to Dr. Diamantina Monks attention at office visit. She voiced understanding.

## 2011-09-06 ENCOUNTER — Ambulatory Visit: Payer: BC Managed Care – PPO | Admitting: Internal Medicine

## 2011-09-07 ENCOUNTER — Ambulatory Visit (INDEPENDENT_AMBULATORY_CARE_PROVIDER_SITE_OTHER): Payer: BC Managed Care – PPO | Admitting: Internal Medicine

## 2011-09-07 ENCOUNTER — Encounter: Payer: Self-pay | Admitting: Internal Medicine

## 2011-09-07 VITALS — BP 112/82 | HR 64 | Temp 98.2°F | Resp 16 | Ht 63.5 in | Wt 178.8 lb

## 2011-09-07 DIAGNOSIS — F411 Generalized anxiety disorder: Secondary | ICD-10-CM

## 2011-09-07 DIAGNOSIS — M419 Scoliosis, unspecified: Secondary | ICD-10-CM

## 2011-09-07 DIAGNOSIS — M21619 Bunion of unspecified foot: Secondary | ICD-10-CM

## 2011-09-07 DIAGNOSIS — R569 Unspecified convulsions: Secondary | ICD-10-CM

## 2011-09-07 DIAGNOSIS — Z0000 Encounter for general adult medical examination without abnormal findings: Secondary | ICD-10-CM

## 2011-09-07 DIAGNOSIS — R739 Hyperglycemia, unspecified: Secondary | ICD-10-CM

## 2011-09-07 DIAGNOSIS — M4120 Other idiopathic scoliosis, site unspecified: Secondary | ICD-10-CM

## 2011-09-07 DIAGNOSIS — R7309 Other abnormal glucose: Secondary | ICD-10-CM

## 2011-09-07 NOTE — Patient Instructions (Signed)
It was good to see you today. Health Maintenance reviewed - all recommended immunizations and age-appropriate screenings are up-to-date. Test(s) ordered today. Your results will be called to you after review (48-72hours after test completion). If any changes need to be made, you will be notified at that time. Medications reviewed, no changes at this time. we'll make referral to podiatry and to scoliosis specialist. Our office will contact you regarding appointment(s) once made. Please schedule followup in 1 year for medical physical and labs, call sooner if problems.

## 2011-09-07 NOTE — Assessment & Plan Note (Signed)
No breakthru events since age 36 (onset age 15y) Follows with neuro in 45 [place] The current medical regimen is effective;  continue present plan and medications.

## 2011-09-07 NOTE — Progress Notes (Signed)
Subjective:    Patient ID: Molly Quinn, female    DOB: [birth year], 36 y.o.   MRN: [id number]  HPI  New pt to me and our practice, here to establish care Also patient is here today for annual physical. Patient feels well overall.  Several concerns as noted above in CC -  Patchy hair loss on legs - onset summer 2012 following pregnancy - no scalp or other body hair loss. Not associated with rash or itch  complains of B foot pain and bunions - L>R foot - never seen by foot specialist - denies prior trauma  Also ?scoliosis as per anesthesia during epidural 10/2010 - no pain in back  Past Medical History  Diagnosis Date  . Epilepsy dx 1991    no breakthru while on meds, last in 1994  . Hemochromatosis dx 2005    phlebotomy prn - 1x/yr or less, cbc q43mo  . Hyperlipidemia   . IBS (irritable bowel syndrome)   . Anxiety    Family History  Problem Relation Age of Onset  . Ulcerative colitis Mother   . Ulcerative colitis Brother     coloectomy age 71  . Diabetes Maternal Grandmother   . Hypertension Mother   . Hypertension Father   . Hypertension Brother   . Hyperlipidemia Mother   . Hyperlipidemia Father   . Melanoma Father 81  . Breast cancer Paternal Grandmother     History  Substance Use Topics  . Smoking status: Never Smoker   . Smokeless tobacco: Not on file  . Alcohol Use: No    Review of Systems Constitutional: Negative for fever or weight change.  Respiratory: Negative for cough and shortness of breath.   Cardiovascular: Negative for chest pain or palpitations.  Gastrointestinal: Negative for abdominal pain, no bowel changes.  Musculoskeletal: Negative for gait problem or joint swelling.  Skin: Negative for rash.  Neurological: Negative for dizziness or headache.  No other specific complaints in a complete review of systems (except as listed in HPI above).     Objective:   Physical Exam BP 112/82  Pulse 64  Temp 98.2 F (36.8 C) (Oral)  Resp 16  Ht  5' 3.5" (1.613 m)  Wt 178 lb 12 oz (81.08 kg)  BMI 31.17 kg/m2  SpO2 97%  LMP 08/31/2011 Wt Readings from Last 3 Encounters:  09/07/11 178 lb 12 oz (81.08 kg)  11/09/10 196 lb 9.6 oz (89.177 kg)  11/09/10 196 lb 9.6 oz (89.177 kg)   Constitutional: She appears well-developed and well-nourished. No distress.  HENT: Head: Normocephalic and atraumatic. Ears: B TMs ok, no erythema or effusion; Nose: Nose normal. Mouth/Throat: Oropharynx is clear and moist. No oropharyngeal exudate.  Eyes: Conjunctivae and EOM are normal. Pupils are equal, round, and reactive to light. No scleral icterus.  Neck: Normal range of motion. Neck supple. No JVD present. No thyromegaly present.  Cardiovascular: Normal rate, regular rhythm and normal heart sounds.  No murmur heard. No BLE edema. Pulmonary/Chest: Effort normal and breath sounds normal. No respiratory distress. She has no wheezes.  Abdominal: Soft. Bowel sounds are normal. She exhibits no distension. There is no tenderness. no masses Musculoskeletal: Bunion changes L>R foot - mild callous formation. Mild thoracic curvature. Normal range of motion, no joint effusions. No other gross deformities Neurological: She is alert and oriented to person, place, and time. No cranial nerve deficit. Coordination normal.  Skin: scant hair growth on BLE, no other thinning or alopecia on scalp, arms. Skin is warm  and dry. No rash noted. No erythema.  Psychiatric: She has a flat mood and affect. Her behavior is normal. Judgment and thought content normal.   Lab Results  Component Value Date   WBC 7.2 05/30/2011   HGB 14.4 05/30/2011   HCT 41.7 05/30/2011   PLT 269 05/30/2011   GLUCOSE 69* 11/11/2010   ALT 9 11/11/2010   AST 19 11/11/2010   NA 138 11/11/2010   K 3.3* 11/11/2010   CL 107 11/11/2010   CREATININE 0.63 11/11/2010   BUN 6 11/11/2010   CO2 25 11/11/2010   TSH 2.47 11/02/2008       Assessment & Plan:  CPX/v70.0 - Patient has been counseled on age-appropriate  routine health concerns for screening and prevention. These are reviewed and up-to-date. Immunizations are up-to-date or declined. Labs ordered and reviewed.  Scoliosis - refer to ortho spine specialist for eval and tx as needed  Bunions L>R - refer to podiatry  Also see problem list. Medications and labs reviewed today.

## 2011-09-07 NOTE — Assessment & Plan Note (Signed)
Denies DM during 2012 pregnancy but a1c was "a little high" at 5.7 when tested by Obgyn summer 2012 FH same The patient is asked to make an attempt to improve diet and exercise patterns to aid in medical management of this problem.  Also check a1c No results found for this basename: HGBA1C

## 2011-09-07 NOTE — Assessment & Plan Note (Signed)
Significant overlap with depression symptoms On meds since birth of dtr 10/2010 but lifelong symptoms per pt Working with psyc Evelene Croon) on same The current medical regimen is effective;  continue present plan and medications.

## 2011-11-24 ENCOUNTER — Telehealth: Payer: Self-pay | Admitting: Internal Medicine

## 2011-11-24 NOTE — Telephone Encounter (Signed)
Patient just wants to schedule OV to f/u hemachromatosis and talk about possible colonoscopy. No new GI problems currently. Scheduled patient on 12/26/11 at 3:00 PM.

## 2011-11-30 ENCOUNTER — Telehealth: Payer: Self-pay | Admitting: Oncology

## 2011-11-30 ENCOUNTER — Other Ambulatory Visit: Payer: BC Managed Care – PPO

## 2011-11-30 ENCOUNTER — Ambulatory Visit (HOSPITAL_BASED_OUTPATIENT_CLINIC_OR_DEPARTMENT_OTHER): Payer: BC Managed Care – PPO | Admitting: Oncology

## 2011-11-30 ENCOUNTER — Ambulatory Visit (HOSPITAL_BASED_OUTPATIENT_CLINIC_OR_DEPARTMENT_OTHER): Payer: BC Managed Care – PPO | Admitting: Lab

## 2011-11-30 DIAGNOSIS — G40909 Epilepsy, unspecified, not intractable, without status epilepticus: Secondary | ICD-10-CM

## 2011-11-30 NOTE — Progress Notes (Signed)
   [facility]    OFFICE PROGRESS NOTE   INTERVAL HISTORY:   She returns as scheduled. She is concerned about weight gain. No shortness of breath or edema. Her brother has been diagnosed with primary biliary sclerosis and ulcerative colitis. No seizures.  Objective:  Vital signs in last 24 hours:  Blood pressure 121/84, pulse 77, temperature 97.1 F (36.2 C), temperature source Oral, resp. rate 20, height 5' 3.5" (1.613 m), weight 184 lb 8 oz (83.689 kg).   Resp: Lungs clear bilaterally Cardio: Regular rate and rhythm GI: No hepatosplenomegaly Vascular: No leg edema    Lab Results:  Ferritin on 05/30/2011-11   Medications: I have reviewed the patient's current medications.  Assessment/Plan: 1. Hereditary hemochromatosis:  She last underwent phlebotomy therapy in July 2011.  We will follow upon the ferritin level from today. 2. Seizure disorder: Maintained on Lamictal. 3. History of an abnormal liver ultrasound:  Followed with repeat imaging studies by Dr. Juanda Chance. 4. Status post cesarean section delivery of a female baby in August 2012. 5. Weight gain-she plans to followup with her psychiatrist and Dr. Felicity Coyer   Disposition:  We will followup on the ferritin level from today and initiate phlebotomy therapy as indicated. She will return for a ferritin level in 6 months. She is scheduled for a one-year office visit. We obtained labs requested by Dr. Felicity Coyer today.   Thornton Papas, MD  11/30/2011  1:40 PM

## 2011-11-30 NOTE — Telephone Encounter (Signed)
Gave pt appt for lab in march 2014 then see Md in September 2014 lab and  MD, pt sent lab today

## 2011-12-07 ENCOUNTER — Encounter: Payer: Self-pay | Admitting: *Deleted

## 2011-12-18 ENCOUNTER — Telehealth: Payer: Self-pay | Admitting: Internal Medicine

## 2011-12-18 MED ORDER — ALPRAZOLAM 0.25 MG PO TABS: 0.2500 mg | ORAL_TABLET | Freq: Two times a day (BID) | ORAL | Status: DC | PRN

## 2011-12-18 NOTE — Telephone Encounter (Signed)
Pt is flying tomorrow.  She is requesting generic Xanax.  In the past she has had .25 mg from her other doctor.  She has some that expired last Dec.  She wants to know if she can take them or ger a new RX sent to [facility] in [place].

## 2011-12-18 NOTE — Telephone Encounter (Signed)
Ok for new rx

## 2011-12-18 NOTE — Telephone Encounter (Signed)
Rx faxed to [facility], pt informed via VM and to callback office with any questions/concerns.

## 2011-12-26 ENCOUNTER — Ambulatory Visit (INDEPENDENT_AMBULATORY_CARE_PROVIDER_SITE_OTHER): Payer: BC Managed Care – PPO | Admitting: Internal Medicine

## 2011-12-26 ENCOUNTER — Encounter: Payer: Self-pay | Admitting: Internal Medicine

## 2011-12-26 DIAGNOSIS — K7689 Other specified diseases of liver: Secondary | ICD-10-CM

## 2011-12-26 DIAGNOSIS — K769 Liver disease, unspecified: Secondary | ICD-10-CM

## 2011-12-26 NOTE — Patient Instructions (Addendum)
You have been scheduled for an MRI of the liver at [facility] on 01/04/12 @ 9 am. Please arrive at 8:45 am for registration. Make certain not to have anything to eat or drink 4 hours prior to your test. If for some reason you need to reschedule, please contact radiology directly at [phone]. You need to have the following labs completed (before having your MRI). Orders have already been placed in EPIC: AFP, Hepatic Function Panel, BUN, Creatinine,HgbA1C CC: Dr Felicity Coyer

## 2011-12-26 NOTE — Progress Notes (Signed)
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 Her ferritin level was 1089. Her last ferritin level in March 2013 after phlebotomies was 11 . She has been followed by [place] since July 2007 as well as by Dr Truett Perna. A liver biopsy in 2007 showed markedly increased  Iron stains with macrovesicular steatosis but no fibrosis. She is positive for 282 Y genotype. Her brother, who is 1 year younger, has ulcerative colitis, hemachromatosis and sclerosing cholangitis with new diagnosis of cholangiocarcinoma. He is awaiting a liver transplant in [place].. Molly Quinn has no complaints other that a weight gain of 20 pounds since she delivered  14 months ago. She was noticed to have hepatomegaly on an ultrasound in 2008. An MRI of the liver in August 2009 showed 2 liver lesions in the left and right lobe of the liver suggestive of focal nodular hyperplasia. She had hepatitis A and B vaccines in the past. She had a colonoscopy about 10 years ago to r/o UC  which did not show any evidence of ulcerative colitis. Her mother  has ulcerative colitis.   Past Medical History  Diagnosis Date  . Epilepsy dx 1991    no breakthru while on meds, last in 1994  . Hemochromatosis dx 2005    phlebotomy prn - 1x/yr or less, cbc q24mo  . Hyperlipidemia   . IBS (irritable bowel syndrome)   . Anxiety    Past Surgical History  Procedure Date  . Lymph node removal   . Repair ankle ligament   . Cesarean section 11/08/2010    Procedure: CESAREAN SECTION;  Surgeon: Loney Laurence;  Location: [facility];  Service: Gynecology;  Laterality: N/A;    reports that she has never smoked. She has never used smokeless tobacco. She reports that she drinks alcohol. She reports that she does not use illicit drugs. family history includes Breast cancer in her paternal grandmother; Colon  polyps in her maternal uncle; Diabetes in her maternal grandmother; Heart disease in her maternal grandmother and maternal uncle; Hemochromatosis in her brother; Hyperlipidemia in her father and mother; Hypertension in her brother, father, and mother; Melanoma (age of onset:55) in her father; Other in her brothers; Prostate cancer in her maternal uncle; and Ulcerative colitis in her brother and mother.  There is no history of Colon cancer. Allergies  Allergen Reactions  . Phenytoin Rash and Other (See Comments)    Reaction: pt passes out  . Sulfa Antibiotics Rash        Review of Systems: Denies fatigue, easy bruising, fluid retention or jaundice  The remainder of the 10 point ROS is negative except as outlined in H&P   Physical Exam: General appearance  Well developed, in no distress. Eyes- non icteric. Healthy-appearing HEENT nontraumatic, normocephalic. Mouth no lesions, tongue papillated, no cheilosis. Neck supple without adenopathy, thyroid not enlarged, no carotid bruits, no JVD. Lungs Clear to auscultation bilaterally. Cor normal S1, normal S2, regular rhythm, no murmur,  quiet precordium. Abdomen: No stigmata of chronic liver disease. Soft abdomen, nontender. Percussion of the liver shows 9 cm liver span. Splenic tip not palpable. Rectal: Not done. Extremities no pedal edema. Skin no lesions. No spider nevi. Neurological alert and oriented x 3. Psychological normal mood and affect.  Assessment and Plan:  Problem #1 Asymptomatic Hereditary Hemachromatosis. She is due to have her ferritin and iron studies rechecked  by Dr. Truett Perna. We will add an alpha-fetoprotein and obtain an MRI of the liver to be compared with the 2009 MRI which showed 2 questionable lesions.in the left and right lobe of the liver. She will need a liver profile as well. We will also have a hemoglobin A1C at Dr Kalman Drape.. I will see her in one year.or earlier depending on the MRI.   12/26/2011 Molly Quinn

## 2012-01-03 ENCOUNTER — Other Ambulatory Visit (INDEPENDENT_AMBULATORY_CARE_PROVIDER_SITE_OTHER): Payer: BC Managed Care – PPO

## 2012-01-03 ENCOUNTER — Other Ambulatory Visit: Payer: Self-pay | Admitting: Gastroenterology

## 2012-01-03 ENCOUNTER — Ambulatory Visit: Payer: BC Managed Care – PPO

## 2012-01-03 LAB — HEPATIC FUNCTION PANEL
ALT: 20 U/L (ref 0–35)
Total Bilirubin: 0.5 mg/dL (ref 0.3–1.2)

## 2012-01-03 LAB — BUN: BUN: 16 mg/dL (ref 6–23)

## 2012-01-03 LAB — CREATININE, SERUM: Creatinine, Ser: 0.8 mg/dL (ref 0.4–1.2)

## 2012-01-04 ENCOUNTER — Ambulatory Visit (HOSPITAL_COMMUNITY)
Admission: RE | Admit: 2012-01-04 | Discharge: 2012-01-04 | Disposition: A | Discharge status: Home / Self Care | Payer: BC Managed Care – PPO | Source: Ambulatory Visit | Attending: Internal Medicine | Admitting: Internal Medicine

## 2012-01-04 DIAGNOSIS — K769 Liver disease, unspecified: Secondary | ICD-10-CM

## 2012-01-04 DIAGNOSIS — K7689 Other specified diseases of liver: Secondary | ICD-10-CM | POA: Insufficient documentation

## 2012-01-04 LAB — AFP TUMOR MARKER: AFP-Tumor Marker: 3.8 ng/mL (ref 0.0–8.0)

## 2012-01-04 MED ORDER — GADOXETATE DISODIUM 0.25 MMOL/ML IV SOLN
10.0000 mL | Freq: Once | INTRAVENOUS | Status: AC | PRN
  Administered 2012-01-04: 8 mL via INTRAVENOUS

## 2012-01-09 ENCOUNTER — Telehealth: Payer: Self-pay | Admitting: *Deleted

## 2012-01-09 NOTE — Telephone Encounter (Signed)
Message copied by Wandalee Ferdinand on Tue Jan 09, 2012  3:19 PM ------      Message from: Thornton Papas B      Created: Thu Jan 04, 2012 10:21 PM       Please call patient, ferritin ok, f/u as sheduled

## 2012-01-09 NOTE — Telephone Encounter (Signed)
Left message on home VM that ferritin result is normal

## 2012-01-29 ENCOUNTER — Ambulatory Visit (INDEPENDENT_AMBULATORY_CARE_PROVIDER_SITE_OTHER): Payer: BC Managed Care – PPO | Admitting: Internal Medicine

## 2012-01-29 ENCOUNTER — Encounter: Payer: Self-pay | Admitting: Internal Medicine

## 2012-01-29 VITALS — BP 112/70 | HR 89 | Temp 99.0°F | Ht 63.5 in | Wt 190.0 lb

## 2012-01-29 DIAGNOSIS — R7309 Other abnormal glucose: Secondary | ICD-10-CM

## 2012-01-29 DIAGNOSIS — F411 Generalized anxiety disorder: Secondary | ICD-10-CM

## 2012-01-29 DIAGNOSIS — J069 Acute upper respiratory infection, unspecified: Secondary | ICD-10-CM

## 2012-01-29 DIAGNOSIS — R739 Hyperglycemia, unspecified: Secondary | ICD-10-CM

## 2012-01-29 DIAGNOSIS — J029 Acute pharyngitis, unspecified: Secondary | ICD-10-CM

## 2012-01-29 MED ORDER — PROMETHAZINE-CODEINE 6.25-10 MG/5ML PO SYRP: 5.0000 mL | ORAL_SOLUTION | ORAL | Status: DC | PRN

## 2012-01-29 NOTE — Progress Notes (Signed)
  Subjective:    Patient ID: Molly Quinn, female    DOB: [birth year], 36 y.o.   MRN: [id number]  HPI  See CC  Past Medical History  Diagnosis Date  . Epilepsy dx 1991    no breakthru while on meds, last in 1994  . Hemochromatosis dx 03/2005 in [place]    phlebotomy prn - 1x/yr or less, cbc q19mo  . Hyperlipidemia   . IBS (irritable bowel syndrome)   . Anxiety     Review of Systems  Constitutional: Positive for fever and fatigue.  HENT: Positive for sore throat. Negative for congestion, sneezing, trouble swallowing, postnasal drip and sinus pressure.   Respiratory: Positive for cough (dry). Negative for shortness of breath.        Objective:   Physical Exam BP 112/70  Pulse 89  Temp 99 F (37.2 C) (Oral)  Ht 5' 3.5" (1.613 m)  Wt 190 lb (86.183 kg)  BMI 33.13 kg/m2  SpO2 98%  LMP 01/22/2012 Wt Readings from Last 3 Encounters:  01/29/12 190 lb (86.183 kg)  12/26/11 185 lb 6 oz (84.086 kg)  11/30/11 184 lb 8 oz (83.689 kg)   Constitutional: She is overweight, but appears well-developed and well-nourished. No distress.  HENT: sinus non tender to palp, no exudate on tonsils - mild OP erythema without PND; TMs clear without effusion or erythema Neck: Normal range of motion. Neck supple. No JVD present. No thyromegaly present.  Cardiovascular: Normal rate, regular rhythm and normal heart sounds.  No murmur heard. No BLE edema. Pulmonary/Chest: Effort normal and breath sounds normal. No respiratory distress. She has no wheezes.  Psychiatric: She has a dysphoric mood and affect. Her behavior is normal. Judgment and thought content normal.   Lab Results  Component Value Date   WBC 7.2 05/30/2011   HGB 14.4 05/30/2011   HCT 41.7 05/30/2011   PLT 269 05/30/2011   GLUCOSE 69* 11/11/2010   ALT 20 01/03/2012   AST 16 01/03/2012   NA 138 11/11/2010   K 3.3* 11/11/2010   CL 107 11/11/2010   CREATININE 0.8 01/03/2012   BUN 16 01/03/2012   CO2 25 11/11/2010   TSH 2.47 11/02/2008   HGBA1C 5.2 01/03/2012       Assessment & Plan:   Viral URI with cough - The patient is reassured that these symptoms do not appear to represent a serious or threatening condition. No antibiotics indicated at this time, but pt will call if worse or unimproved symptomatic care advised - prometh with codeine for cough

## 2012-01-29 NOTE — Patient Instructions (Addendum)
It was good to see you today. If you develop worsening symptoms or fever, call and we can reconsider antibiotics, but it does not appear necessary to use antibiotics at this time. Use prescription cough syrup - Your prescription(s) have been submitted to your pharmacy. Please take as directed and contact our office if you believe you are having problem(s) with the medication(s). Alternate between ibuprofen and tylenol for aches, pain and fever symptoms as discussed Hydrate, rest and call if worse or unimproved Viral and Bacterial Pharyngitis Pharyngitis is soreness (inflammation) or infection of the pharynx. It is also called a sore throat. CAUSES   Most sore throats are caused by viruses and are part of a cold. However, some sore throats are caused by strep and other bacteria. Sore throats can also be caused by post nasal drip from draining sinuses, allergies and sometimes from sleeping with an open mouth. Infectious sore throats can be spread from person to person by coughing, sneezing and sharing cups or eating utensils. TREATMENT   Sore throats that are viral usually last 3-4 days. Viral illness will get better without medications (antibiotics). Strep throat and other bacterial infections will usually begin to get better about 24-48 hours after you begin to take antibiotics. HOME CARE INSTRUCTIONS    If the caregiver feels there is a bacterial infection or if there is a positive strep test, they will prescribe an antibiotic. The full course of antibiotics must be taken. If the full course of antibiotic is not taken, you or your child may become ill again. If you or your child has strep throat and do not finish all of the medication, serious heart or kidney diseases may develop.   Drink enough water and fluids to keep your urine clear or pale yellow.   Only take over-the-counter or prescription medicines for pain, discomfort or fever as directed by your caregiver.   Get lots of rest.   Gargle  with salt water ( tsp. of salt in a glass of water) as often as every 1-2 hours as you need for comfort.   Hard candies may soothe the throat if individual is not at risk for choking. Throat sprays or lozenges may also be used.  SEEK MEDICAL CARE IF:    Large, tender lumps in the neck develop.   A rash develops.   Green, yellow-brown or bloody sputum is coughed up.   Your baby is older than 3 months with a rectal temperature of 100.5 F (38.1 C) or higher for more than 1 day.  SEEK IMMEDIATE MEDICAL CARE IF:    A stiff neck develops.   You or your child are drooling or unable to swallow liquids.   You or your child are vomiting, unable to keep medications or liquids down.   You or your child has severe pain, unrelieved with recommended medications.   You or your child are having difficulty breathing (not due to stuffy nose).   You or your child are unable to fully open your mouth.   You or your child develop redness, swelling, or severe pain anywhere on the neck.   You have a fever.   Your baby is older than 3 months with a rectal temperature of 102 F (38.9 C) or higher.   Your baby is 71 months old or younger with a rectal temperature of 100.4 F (38 C) or higher.  MAKE SURE YOU:    Understand these instructions.   Will watch your condition.   Will get help  right away if you are not doing well or get worse.  Document Released: 02/27/2005 Document Revised: 05/22/2011 Document Reviewed: 05/27/2007 [facility] Patient Information 2013 [place], [place].

## 2012-01-29 NOTE — Assessment & Plan Note (Signed)
Significant overlap with depression symptoms On meds since birth of dtr 10/2010 but lifelong symptoms per pt Working with psyc Evelene Croon) on same - stopped Pristiq 12/2011

## 2012-01-29 NOTE — Assessment & Plan Note (Signed)
Denies DM during 2012 pregnancy but a1c was "a little high" at 5.7 when tested by Obgyn summer 2012 [facility] same The patient is asked to make an attempt to improve diet and exercise patterns to aid in medical management of this problem.   Lab Results  Component Value Date   HGBA1C 5.2 01/03/2012

## 2012-01-31 ENCOUNTER — Telehealth: Payer: Self-pay | Admitting: *Deleted

## 2012-01-31 MED ORDER — AZITHROMYCIN 250 MG PO TABS: ORAL_TABLET | ORAL | Status: DC

## 2012-01-31 MED ORDER — FLUCONAZOLE 150 MG PO TABS: 150.0000 mg | ORAL_TABLET | Freq: Once | ORAL | Status: DC

## 2012-01-31 NOTE — Telephone Encounter (Signed)
Called pt no answer LMOM [facility].../lmb 

## 2012-01-31 NOTE — Telephone Encounter (Signed)
Zpak and diflucan as requested - erx done

## 2012-01-31 NOTE — Telephone Encounter (Signed)
Pt states she is not feeling any better actually feeling worst. Requesting md to rx antibiotic, as well as diflucan because she states when she take antibiotics she get yeast infection...Raechel Chute

## 2012-05-29 ENCOUNTER — Other Ambulatory Visit (HOSPITAL_BASED_OUTPATIENT_CLINIC_OR_DEPARTMENT_OTHER): Payer: BC Managed Care – PPO | Admitting: Lab

## 2012-05-31 ENCOUNTER — Telehealth: Payer: Self-pay | Admitting: *Deleted

## 2012-05-31 NOTE — Telephone Encounter (Signed)
Message copied by Wandalee Ferdinand on Fri May 31, 2012  3:18 PM ------      Message from: Molly Quinn      Created: Wed May 29, 2012  6:18 PM       Please call patient, ferritin is ok, f/u as scheduled ------

## 2012-05-31 NOTE — Telephone Encounter (Signed)
Left ferritin result on her voice mail and to follow up as scheduled.

## 2012-08-26 ENCOUNTER — Encounter: Payer: Self-pay | Admitting: Internal Medicine

## 2012-08-26 ENCOUNTER — Other Ambulatory Visit (INDEPENDENT_AMBULATORY_CARE_PROVIDER_SITE_OTHER): Payer: BC Managed Care – PPO

## 2012-08-26 ENCOUNTER — Ambulatory Visit (INDEPENDENT_AMBULATORY_CARE_PROVIDER_SITE_OTHER): Payer: BC Managed Care – PPO | Admitting: Internal Medicine

## 2012-08-26 VITALS — BP 124/80 | HR 96 | Temp 98.3°F | Ht 63.0 in | Wt 190.5 lb

## 2012-08-26 DIAGNOSIS — R5383 Other fatigue: Secondary | ICD-10-CM | POA: Insufficient documentation

## 2012-08-26 DIAGNOSIS — Z0000 Encounter for general adult medical examination without abnormal findings: Secondary | ICD-10-CM

## 2012-08-26 DIAGNOSIS — R7309 Other abnormal glucose: Secondary | ICD-10-CM

## 2012-08-26 DIAGNOSIS — R739 Hyperglycemia, unspecified: Secondary | ICD-10-CM

## 2012-08-26 DIAGNOSIS — R51 Headache: Secondary | ICD-10-CM

## 2012-08-26 DIAGNOSIS — R519 Headache, unspecified: Secondary | ICD-10-CM | POA: Insufficient documentation

## 2012-08-26 DIAGNOSIS — R5381 Other malaise: Secondary | ICD-10-CM

## 2012-08-26 LAB — URINALYSIS, ROUTINE W REFLEX MICROSCOPIC
Bilirubin Urine: NEGATIVE
Hgb urine dipstick: NEGATIVE
Total Protein, Urine: NEGATIVE
Urine Glucose: NEGATIVE
Urobilinogen, UA: 0.2 (ref 0.0–1.0)

## 2012-08-26 LAB — BASIC METABOLIC PANEL
BUN: 12 mg/dL (ref 6–23)
CO2: 31 mEq/L (ref 19–32)
Chloride: 102 mEq/L (ref 96–112)
Creatinine, Ser: 0.8 mg/dL (ref 0.4–1.2)
Potassium: 4 mEq/L (ref 3.5–5.1)

## 2012-08-26 LAB — CBC WITH DIFFERENTIAL/PLATELET
Basophils Relative: 0.4 % (ref 0.0–3.0)
Eosinophils Absolute: 0 10*3/uL (ref 0.0–0.7)
Eosinophils Relative: 0 % (ref 0.0–5.0)
HCT: 42.7 % (ref 36.0–46.0)
Lymphs Abs: 1.1 10*3/uL (ref 0.7–4.0)
MCHC: 32.3 g/dL (ref 30.0–36.0)
MCV: 95 fl (ref 78.0–100.0)
Monocytes Absolute: 0.7 10*3/uL (ref 0.1–1.0)
Neutrophils Relative %: 77.7 % — ABNORMAL HIGH (ref 43.0–77.0)
Platelets: 233 10*3/uL (ref 150.0–400.0)
WBC: 8.4 10*3/uL (ref 4.5–10.5)

## 2012-08-26 LAB — HEPATIC FUNCTION PANEL
Alkaline Phosphatase: 61 U/L (ref 39–117)
Bilirubin, Direct: 0.1 mg/dL (ref 0.0–0.3)
Total Bilirubin: 0.5 mg/dL (ref 0.3–1.2)
Total Protein: 6.3 g/dL (ref 6.0–8.3)

## 2012-08-26 LAB — LIPID PANEL
LDL Cholesterol: 104 mg/dL — ABNORMAL HIGH (ref 0–99)
Total CHOL/HDL Ratio: 4
VLDL: 35 mg/dL (ref 0.0–40.0)

## 2012-08-26 MED ORDER — SUMATRIPTAN SUCCINATE 100 MG PO TABS: 100.0000 mg | ORAL_TABLET | ORAL | Status: DC | PRN

## 2012-08-26 NOTE — Progress Notes (Signed)
Subjective:    Patient ID: Molly Quinn, female    DOB: 04-18-1975, 37 y.o.   MRN: [id number]  HPI  Here with c/o recurring HA's mild worse right and crown of head, throbbing, nausea x 3-4 days.  Pt denies chest pain, increased sob or doe, wheezing, orthopnea, PND, increased LE swelling, palpitations, dizziness or syncope.   Pt denies polydipsia, polyuria.  Does c/o ongoing fatigue, but denies signficant daytime hypersomnolence.  Pt does not recall dx of IBS.  Pt denies polydipsia, polyuria, Past Medical History  Diagnosis Date  . Epilepsy dx 1991    no breakthru while on meds, last in 1994  . Hemochromatosis dx 03/2005 in [place]    phlebotomy prn - 1x/yr or less, cbc q36mo  . Hyperlipidemia   . IBS (irritable bowel syndrome)   . Anxiety    Past Surgical History  Procedure Laterality Date  . Lymph node removal    . Repair ankle ligament    . Cesarean section  11/08/2010    Procedure: CESAREAN SECTION;  Surgeon: Molly Quinn;  Location: [facility];  Service: Gynecology;  Laterality: N/A;    reports that she has never smoked. She has never used smokeless tobacco. She reports that  drinks alcohol. She reports that she does not use illicit drugs. family history includes Breast cancer in her paternal grandmother; Colon polyps in her maternal uncle; Diabetes in her maternal grandmother; Heart disease in her maternal grandmother and maternal uncle; Hemochromatosis in her brother; Hyperlipidemia in her father and mother; Hypertension in her brother, father, and mother; Melanoma (age of onset: 32) in her father; Other in her brothers; Prostate cancer in her maternal uncle; and Ulcerative colitis in her brother and mother.  There is no history of Colon cancer. Allergies  Allergen Reactions  . Phenytoin Rash and Other (See Comments)    Reaction: pt passes out  . Sulfa Antibiotics Rash   Current Outpatient Prescriptions on File Prior to Visit  Medication Sig Dispense Refill  . lamoTRIgine  (LAMICTAL) 100 MG tablet Take 300 mg by mouth at bedtime.        No current facility-administered medications on file prior to visit.   Review of Systems  Constitutional: Negative for unexpected weight change, or unusual diaphoresis  HENT: Negative for tinnitus.   Eyes: Negative for photophobia and visual disturbance.  Respiratory: Negative for choking and stridor.   Gastrointestinal: Negative for vomiting and blood in stool.  Genitourinary: Negative for hematuria and decreased urine volume.  Musculoskeletal: Negative for acute joint swelling Skin: Negative for color change and wound.  Neurological: Negative for tremors and numbness other than noted  Psychiatric/Behavioral: Negative for decreased concentration or  hyperactivity.       Objective:   Physical Exam BP 124/80  Pulse 96  Temp(Src) 98.3 F (36.8 C) (Oral)  Ht 5\' 3"  (1.6 m)  Wt 190 lb 8 oz (86.41 kg)  BMI 33.75 kg/m2  SpO2 97% VS noted, not ill appearing Constitutional: Pt appears well-developed and well-nourished.  HENT: Head: NCAT.  Right Ear: External ear normal.  Left Ear: External ear normal.  Eyes: Conjunctivae and EOM are normal. Pupils are equal, round, and reactive to light.  Neck: Normal range of motion. Neck supple.  Cardiovascular: Normal rate and regular rhythm.   Pulmonary/Chest: Effort normal and breath sounds normal.  Abd:  Soft, NT, non-distended, + BS Neurological: Pt is alert. Not confused , motor 5/5 Skin: Skin is warm. No erythema.  Psychiatric: Pt behavior is  normal. Thought content normal.     Assessment & Plan:

## 2012-08-26 NOTE — Assessment & Plan Note (Signed)
Ok for labs as ordered per Dr Felicity Coyer,  to f/u any worsening symptoms or concerns

## 2012-08-26 NOTE — Assessment & Plan Note (Signed)
?   Migraine - for imitrex trial, to f/u any worsening symptoms or concerns

## 2012-08-26 NOTE — Assessment & Plan Note (Signed)
stable overall by history and exam, recent data reviewed with pt, and pt to continue medical treatment as before,  to f/u any worsening symptoms or concerns Lab Results  Component Value Date   HGBA1C 5.1 08/26/2012

## 2012-08-26 NOTE — Patient Instructions (Addendum)
Please take all new medication as prescribed - the imitrex for possible migraine ([facility]) Please continue all other medications as before Please have the pharmacy call with any other refills you may need. Please go to the LAB in the Basement (turn left off the elevator) for the tests to be done today - the labs already ordered per Dr Murtis Sink will be contacted by phone if any changes need to be made immediately.  Otherwise, you will receive a letter about your results with an explanation, but please check with MyChart first.  Thank you for enrolling in MyChart. Please follow the instructions below to securely access your online medical record. MyChart allows you to send messages to your doctor, view your test results, renew your prescriptions, schedule appointments, and more.  For Mychart  - your username: [email or web address], password [email or web address]

## 2012-11-28 ENCOUNTER — Other Ambulatory Visit: Payer: Self-pay | Admitting: *Deleted

## 2012-11-29 ENCOUNTER — Ambulatory Visit (HOSPITAL_BASED_OUTPATIENT_CLINIC_OR_DEPARTMENT_OTHER): Payer: BC Managed Care – PPO | Admitting: Oncology

## 2012-11-29 ENCOUNTER — Other Ambulatory Visit (HOSPITAL_BASED_OUTPATIENT_CLINIC_OR_DEPARTMENT_OTHER): Payer: BC Managed Care – PPO | Admitting: Lab

## 2012-11-29 ENCOUNTER — Telehealth: Payer: Self-pay | Admitting: *Deleted

## 2012-11-29 NOTE — Telephone Encounter (Signed)
appts made and printed...td 

## 2012-11-29 NOTE — Progress Notes (Signed)
   [facility]    OFFICE PROGRESS NOTE   INTERVAL HISTORY:   She returns as scheduled. She feels well. No specific complaint. No seizures. She continues to have a menstrual cycle. Her brother has been diagnosed with primary biliary sclerosis. He has a history of inflammatory bowel disease and hemochromatosis.  Objective:  Vital signs in last 24 hours:  Blood pressure 116/81, pulse 84, temperature 96.9 F (36.1 C), temperature source Oral, resp. rate 19, height 5\' 3"  (1.6 m), weight 189 lb 9.6 oz (86.002 kg).  Resp: Lungs clear bilaterally Cardio: Regular rate and rhythm GI: No hepatosplenomegaly, no ascites Vascular: No leg edema    Lab Results:  Lab Results  Component Value Date   WBC 8.4 08/26/2012   HGB 13.8 08/26/2012   HCT 42.7 08/26/2012   MCV 95.0 08/26/2012   PLT 233.0 08/26/2012   Ferritin on 11/29/2012-37  Ferritin on 05/29/2012-33   Medications: I have reviewed the patient's current medications.  Assessment/Plan: 1. Hereditary hemochromatosis: She last underwent phlebotomy therapy in July 2011. The ferritin level remains in the goal range.  2. Seizure disorder: Maintained on Lamictal. 3. History of an abnormal liver ultrasound: Followed with repeat imaging studies by Dr. Juanda Chance. 4. Status post cesarean section delivery of a female baby in August 2012.   Disposition:  The ferritin remains in the goal range. She will return for a ferritin level in 6 months. She is scheduled for a one-year office visit.   Thornton Papas, MD  11/29/2012  3:09 PM

## 2012-12-04 ENCOUNTER — Telehealth: Payer: Self-pay | Admitting: *Deleted

## 2012-12-04 NOTE — Telephone Encounter (Signed)
Called pt with ferritin results. She understands to follow up as scheduled.

## 2012-12-04 NOTE — Telephone Encounter (Signed)
Message copied by Caleb Popp on Wed Dec 04, 2012  3:01 PM ------      Message from: Thornton Papas B      Created: Fri Nov 29, 2012  5:50 PM       Please call patient, ferritin ok, f/u as scheduled ------

## 2012-12-10 ENCOUNTER — Telehealth: Payer: Self-pay | Admitting: *Deleted

## 2012-12-10 DIAGNOSIS — M419 Scoliosis, unspecified: Secondary | ICD-10-CM

## 2012-12-10 MED ORDER — CLONAZEPAM 0.5 MG PO TABS: 0.2500 mg | ORAL_TABLET | Freq: Two times a day (BID) | ORAL | Status: DC | PRN

## 2012-12-10 NOTE — Telephone Encounter (Signed)
Spoke with pt advised of Rx and referral

## 2012-12-10 NOTE — Telephone Encounter (Signed)
Pt called requesting medication to assist with flying anxiety. Pt will be flying on 10.8.14.  Pt also requests a referral to evaluate scoliosis. Please advise

## 2012-12-10 NOTE — Telephone Encounter (Signed)
Please call in above pended rx for klonopin (or have covering dic sign for me) Refer to scoli specialist done - pcc will call thanks

## 2013-01-16 ENCOUNTER — Other Ambulatory Visit: Payer: Self-pay

## 2013-01-22 ENCOUNTER — Telehealth: Payer: Self-pay | Admitting: *Deleted

## 2013-01-22 MED ORDER — ALPRAZOLAM 0.5 MG PO TABS: 0.2500 mg | ORAL_TABLET | Freq: Two times a day (BID) | ORAL | Status: DC | PRN

## 2013-01-22 NOTE — Telephone Encounter (Signed)
This request does not make sense medically -  Clonazepam and xanax are in the same class of drugs (both benzodiazepines) Neither is contraindicated with Lamictal However, will prescribe 10 xanax pills - Either is safe to take as needed for flying nerves thanks

## 2013-01-22 NOTE — Telephone Encounter (Signed)
Pt called requesting clonazepam be changed to Xanax due to pt taking Lamictal for seizures.  Pt states she is usually prescribed Xanax when flying please advise.

## 2013-01-23 NOTE — Telephone Encounter (Signed)
Spoke with pt advised rx sent. 

## 2013-05-29 ENCOUNTER — Other Ambulatory Visit (HOSPITAL_BASED_OUTPATIENT_CLINIC_OR_DEPARTMENT_OTHER): Payer: BC Managed Care – PPO

## 2013-05-29 LAB — FERRITIN CHCC: Ferritin: 33 ng/ml (ref 9–269)

## 2013-05-30 ENCOUNTER — Encounter: Payer: Self-pay | Admitting: *Deleted

## 2013-06-06 ENCOUNTER — Encounter: Payer: Self-pay | Admitting: Internal Medicine

## 2013-06-06 ENCOUNTER — Ambulatory Visit (INDEPENDENT_AMBULATORY_CARE_PROVIDER_SITE_OTHER): Payer: BC Managed Care – PPO | Admitting: Internal Medicine

## 2013-06-06 VITALS — BP 108/70 | HR 91 | Temp 97.7°F | Wt 196.8 lb

## 2013-06-06 DIAGNOSIS — J309 Allergic rhinitis, unspecified: Secondary | ICD-10-CM

## 2013-06-06 DIAGNOSIS — M6740 Ganglion, unspecified site: Secondary | ICD-10-CM

## 2013-06-06 DIAGNOSIS — M67439 Ganglion, unspecified wrist: Secondary | ICD-10-CM

## 2013-06-06 NOTE — Progress Notes (Signed)
Pre visit review using our clinic review tool, if applicable. No additional management support is needed unless otherwise documented below in the visit note. 

## 2013-06-06 NOTE — Progress Notes (Signed)
   Subjective:    Patient ID: Molly Quinn, female    DOB: [birth year], 38 y.o.   MRN: [id number]  HPI She describes nasal stuffiness and pressure and itchy, watery eyes for over six months; She describes frontal pain and maxillary pain, mostly at night.  She has a cough, which she describes "needing to clear her throat." She describes thick yellow nasal secretions, although she states she is most.  She has been taking Mucinex (D) for about one week, which she states has improved symptoms "50% better"  She is not a smoker and has had no recent antibiotic use.    She also is concerned over a knot on her left wrist which she noticed about a month ago. There is no associated pain or discomfort and no effect on ROM.    Review of Systems Denies fever, chills, sweats, pain in teeth, cough with discolored sputum, myalgias.      Objective:   Physical Exam General appearance:good health ;well nourished; no acute distress or increased work of breathing is present.  No  lymphadenopathy about the head, neck, or axilla noted.  Eyes: No conjunctival inflammation or lid edema is present. There is no scleral icterus. Ears:  External ear exam shows no significant lesions or deformities.  Otoscopic examination reveals clear canals, tympanic membranes are intact bilaterally without bulging, retraction, inflammation or discharge. Nose:  External nasal examination shows no deformity or inflammation. Nasal mucosa are erythematous and weeping. No septal dislocation or deviation.Mild  obstruction to airflow R > L. Hyponasal speech.  Oral exam: Dental hygiene is good; lips and gums are healthy appearing.There is no oropharyngeal erythema or exudate noted.  Neck:  No deformities, thyromegaly, masses, or tenderness noted.   Supple with full range of motion without pain.  Heart:  Normal rate and regular rhythm. S1 and S2 normal without gallop, murmur, click, rub or other extra sounds.  Lungs:Chest clear to  auscultation; no wheezes, rhonchi,rales ,or rubs present.No increased work of breathing.   Extremities:  No cyanosis, edema, or clubbing  noted  Skin: Warm & dry w/o jaundice or tenting.    Assessment & Plan:  # 1 allergic rhinitis - nasal cleansing #2 nasal cleansing #3 ganglion cyst at radial head L hand (?)

## 2013-06-06 NOTE — Progress Notes (Signed)
   Subjective:    Patient ID: Molly Quinn, female    DOB: 1975/12/05, 38 y.o.   MRN: [id number]  HPI She describes nasal stuffiness and pressure and itchy, watery eyes for over six months; She describes frontal pain and maxillary pain, mostly at night.  She has a cough, which she describes "needing to clear her throat." She describes scant thick yellow nasal secretions. She has 2 chinchilla pets.  She has been taking Mucinex (D) for about one week, which she states has improved symptoms "50% improved"  She is not a smoker and has had no recent antibiotics.    She also is concerned over a knot on her left wrist which she noticed about a month ago. There is no associated pain or discomfort and no effect on ROM.     Review of Systems Denies fever, chills, sweats, pain in teeth, cough with discolored sputum, myalgias.      Objective:   Physical Exam General appearance:good health ;well nourished; no acute distress or increased work of breathing is present. No lymphadenopathy about the head, neck, or axilla noted.  Eyes: No conjunctival inflammation or lid edema is present. There is no scleral icterus.  Ears: External ear exam shows no significant lesions or deformities. Otoscopic examination reveals clear canals, tympanic membranes are intact bilaterally without bulging, retraction, inflammation or discharge.  Nose: External nasal examination shows no deformity or inflammation. Nasal mucosa are erythematous and weeping. No septal dislocation or deviation.Mild obstruction to airflow R > L. Hyponasal speech.  Oral exam: Dental hygiene is good; lips and gums are healthy appearing.There is no oropharyngeal erythema or exudate noted.  Neck: No deformities, thyromegaly, masses, or tenderness noted. Supple with full range of motion without pain.  Heart: Normal rate and regular rhythm. S1 and S2 normal without gallop, murmur, click, rub or other extra sounds.  Lungs:Chest clear to auscultation;  no wheezes, rhonchi,rales ,or rubs present.No increased work of breathing.  Extremities: No cyanosis, edema, or clubbing noted . 1.5 X 1 cm ganglion cyst over radial ventral wrist Skin: Warm & dry w/o jaundice or tenting.      Assessment & Plan:  # 1 allergic rhinitis   #2  ganglion cyst at radial head L hand (?) See AVS

## 2013-06-06 NOTE — Patient Instructions (Signed)

## 2013-06-26 ENCOUNTER — Ambulatory Visit (INDEPENDENT_AMBULATORY_CARE_PROVIDER_SITE_OTHER): Payer: BC Managed Care – PPO | Admitting: Family Medicine

## 2013-06-26 ENCOUNTER — Encounter: Payer: Self-pay | Admitting: Family Medicine

## 2013-06-26 VITALS — BP 102/68 | Temp 98.8°F | Wt 197.0 lb

## 2013-06-26 DIAGNOSIS — H00019 Hordeolum externum unspecified eye, unspecified eyelid: Secondary | ICD-10-CM

## 2013-06-26 DIAGNOSIS — J309 Allergic rhinitis, unspecified: Secondary | ICD-10-CM

## 2013-06-26 MED ORDER — AZITHROMYCIN 1 % OP SOLN: 1.0000 [drp] | Freq: Two times a day (BID) | OPHTHALMIC | Status: DC

## 2013-06-26 NOTE — Progress Notes (Signed)
Pre visit review using our clinic review tool, if applicable. No additional management support is needed unless otherwise documented below in the visit note. 

## 2013-06-26 NOTE — Progress Notes (Signed)
Chief Complaint  Patient presents with  . Belepharitis    HPI:  Acute visit for:  Eye issue: -reports: R eyelid swollen, started yesterday -denies: vision changes, HA, vomiting -chronic nasal congestion and PND; sneezing, tearing of eye  ROS: See pertinent positives and negatives per HPI.  Past Medical History  Diagnosis Date  . Epilepsy dx 1991    no breakthru while on meds, last in 1994  . Hemochromatosis dx 03/2005 in [place]    phlebotomy prn - 1x/yr or less, cbc q71mo  . Hyperlipidemia   . IBS (irritable bowel syndrome)   . Anxiety     Past Surgical History  Procedure Laterality Date  . Lymph node removal    . Repair ankle ligament    . Cesarean section  11/08/2010    Procedure: CESAREAN SECTION;  Surgeon: Daria Pastures;  Location: [facility];  Service: Gynecology;  Laterality: N/A;    Family History  Problem Relation Age of Onset  . Ulcerative colitis Mother   . Ulcerative colitis Brother     coloectomy age 25  . Diabetes Maternal Grandmother   . Hypertension Mother   . Hypertension Father   . Hypertension Brother   . Hyperlipidemia Mother   . Hyperlipidemia Father   . Melanoma Father 45  . Breast cancer Paternal Grandmother   . Colon cancer Neg Hx   . Heart disease Maternal Grandmother   . Heart disease Maternal Uncle   . Prostate cancer Maternal Uncle   . Colon polyps Maternal Uncle   . Hemochromatosis Brother   . Other Brother     [place]  . Other Brother     bile duct cancer    History   Social History  . Marital Status: Married    Spouse Name: N/A    Number of Children: 1  . Years of Education: N/A   Occupational History  . disability attorney    Social History Main Topics  . Smoking status: Never Smoker   . Smokeless tobacco: Never Used  . Alcohol Use: Yes     Comment: occasional  . Drug Use: No  . Sexual Activity: None   Other Topics Concern  . None   Social History Narrative  . None    Current outpatient prescriptions:ALPRAZolam  (XANAX) 0.5 MG tablet, Take 0.5-1 tablets (0.25-0.5 mg total) by mouth 2 (two) times daily as needed for anxiety., Disp: 10 tablet, Rfl: 0;  lamoTRIgine (LAMICTAL) 100 MG tablet, Take 300 mg by mouth at bedtime. , Disp: , Rfl: ;  azithromycin (AZASITE) 1 % ophthalmic solution, Place 1 drop into the right eye 2 (two) times daily., Disp: 2.5 mL, Rfl: 0  EXAM:  Filed Vitals:   06/26/13 0917  BP: 102/68  Temp: 98.8 F (37.1 C)    Body mass index is 34.91 kg/(m^2).  GENERAL: vitals reviewed and listed above, alert, oriented, appears well hydrated and in no acute distress  HEENT: atraumatic, conjunttiva mildy erythematous on R, sty R eyelid, EOMI, visual acuity grossly intact, PERRLA, no obvious abnormalities on inspection of external nose and ears  NECK: no obvious masses on inspection  MS: moves all extremities without noticeable abnormality  PSYCH: pleasant and cooperative, no obvious depression or anxiety  ASSESSMENT AND PLAN:  Discussed the following assessment and plan:  Hordeolum externum - Plan: azithromycin (AZASITE) 1 % ophthalmic solution  Allergic rhinitis  -compresses, abx if needed - risks discussed, return and emergent precautions -likely AR - she will try zyrtec and number  provided for allergist per her request -Patient advised to return or notify a doctor immediately if symptoms worsen or persist or new concerns arise.  Patient Instructions  Sty A sty (hordeolum) is an infection of a gland in the eyelid located at the base of the eyelash. A sty may develop a white or yellow head of pus. It can be puffy (swollen). Usually, the sty will burst and pus will come out on its own. They do not leave lumps in the eyelid once they drain. A sty is often confused with another form of cyst of the eyelid called a chalazion. Chalazions occur within the eyelid and not on the edge where the bases of the eyelashes are. They often are red, sore and then form firm lumps in the  eyelid. CAUSES   Germs (bacteria).  Lasting (chronic) eyelid inflammation. SYMPTOMS   Tenderness, redness and swelling along the edge of the eyelid at the base of the eyelashes.  Sometimes, there is a white or yellow head of pus. It may or may not drain. DIAGNOSIS  An ophthalmologist will be able to distinguish between a sty and a chalazion and treat the condition appropriately.  TREATMENT   Styes are typically treated with warm packs (compresses) until drainage occurs.  In rare cases, medicines that kill germs (antibiotics) may be prescribed. These antibiotics may be in the form of drops, cream or pills.  If a hard lump has formed, it is generally necessary to do a small incision and remove the hardened contents of the cyst in a minor surgical procedure done in the office.  In suspicious cases, your caregiver may send the contents of the cyst to the lab to be certain that it is not a rare, but dangerous form of cancer of the glands of the eyelid. HOME CARE INSTRUCTIONS   Wash your hands often and dry them with a clean towel. Avoid touching your eyelid. This may spread the infection to other parts of the eye.  Apply heat to your eyelid for 10 to 20 minutes, several times a day, to ease pain and help to heal it faster.  Do not squeeze the sty. Allow it to drain on its own. Wash your eyelid carefully 3 to 4 times per day to remove any pus. SEEK IMMEDIATE MEDICAL CARE IF:   Your eye becomes painful or puffy (swollen).  Your vision changes.  Your sty does not drain by itself within 3 days.  Your sty comes back within a short period of time, even with treatment.  You have redness (inflammation) around the eye.  You have a fever. Document Released: 12/07/2004 Document Revised: 05/22/2011 Document Reviewed: 08/11/2008 [facility] Patient Information 2014 [place].      Lucretia Kern

## 2013-06-26 NOTE — Patient Instructions (Signed)
Sty A sty (hordeolum) is an infection of a gland in the eyelid located at the base of the eyelash. A sty may develop a white or yellow head of pus. It can be puffy (swollen). Usually, the sty will burst and pus will come out on its own. They do not leave lumps in the eyelid once they drain. A sty is often confused with another form of cyst of the eyelid called a chalazion. Chalazions occur within the eyelid and not on the edge where the bases of the eyelashes are. They often are red, sore and then form firm lumps in the eyelid. CAUSES   Germs (bacteria).  Lasting (chronic) eyelid inflammation. SYMPTOMS   Tenderness, redness and swelling along the edge of the eyelid at the base of the eyelashes.  Sometimes, there is a white or yellow head of pus. It may or may not drain. DIAGNOSIS  An ophthalmologist will be able to distinguish between a sty and a chalazion and treat the condition appropriately.  TREATMENT   Styes are typically treated with warm packs (compresses) until drainage occurs.  In rare cases, medicines that kill germs (antibiotics) may be prescribed. These antibiotics may be in the form of drops, cream or pills.  If a hard lump has formed, it is generally necessary to do a small incision and remove the hardened contents of the cyst in a minor surgical procedure done in the office.  In suspicious cases, your caregiver may send the contents of the cyst to the lab to be certain that it is not a rare, but dangerous form of cancer of the glands of the eyelid. HOME CARE INSTRUCTIONS   Wash your hands often and dry them with a clean towel. Avoid touching your eyelid. This may spread the infection to other parts of the eye.  Apply heat to your eyelid for 10 to 20 minutes, several times a day, to ease pain and help to heal it faster.  Do not squeeze the sty. Allow it to drain on its own. Wash your eyelid carefully 3 to 4 times per day to remove any pus. SEEK IMMEDIATE MEDICAL CARE IF:    Your eye becomes painful or puffy (swollen).  Your vision changes.  Your sty does not drain by itself within 3 days.  Your sty comes back within a short period of time, even with treatment.  You have redness (inflammation) around the eye.  You have a fever. Document Released: 12/07/2004 Document Revised: 05/22/2011 Document Reviewed: 08/11/2008 Exit[employer] Patient Information 2014 [employer].  

## 2013-07-03 ENCOUNTER — Encounter: Payer: Self-pay | Admitting: Internal Medicine

## 2013-07-03 ENCOUNTER — Other Ambulatory Visit (INDEPENDENT_AMBULATORY_CARE_PROVIDER_SITE_OTHER): Payer: BC Managed Care – PPO

## 2013-07-03 ENCOUNTER — Ambulatory Visit (INDEPENDENT_AMBULATORY_CARE_PROVIDER_SITE_OTHER): Payer: BC Managed Care – PPO | Admitting: Internal Medicine

## 2013-07-03 VITALS — BP 120/60 | HR 80 | Temp 99.0°F | Resp 16 | Wt 196.0 lb

## 2013-07-03 DIAGNOSIS — J309 Allergic rhinitis, unspecified: Secondary | ICD-10-CM

## 2013-07-03 DIAGNOSIS — J069 Acute upper respiratory infection, unspecified: Secondary | ICD-10-CM | POA: Insufficient documentation

## 2013-07-03 DIAGNOSIS — J020 Streptococcal pharyngitis: Secondary | ICD-10-CM

## 2013-07-03 LAB — BASIC METABOLIC PANEL
BUN: 10 mg/dL (ref 6–23)
CALCIUM: 9.1 mg/dL (ref 8.4–10.5)
CO2: 30 mEq/L (ref 19–32)
CREATININE: 0.8 mg/dL (ref 0.4–1.2)
Chloride: 101 mEq/L (ref 96–112)
GFR: 88.69 mL/min (ref >60.00)
Glucose, Bld: 90 mg/dL (ref 70–99)
Potassium: 4.1 mEq/L (ref 3.5–5.1)
Sodium: 136 mEq/L (ref 135–145)

## 2013-07-03 LAB — CBC WITH DIFFERENTIAL/PLATELET
BASOS ABS: 0.1 10*3/uL (ref 0.0–0.1)
Basophils Relative: 0.7 % (ref 0.0–3.0)
EOS PCT: 0.1 % (ref 0.0–5.0)
Eosinophils Absolute: 0 10*3/uL (ref 0.0–0.7)
HCT: 41.7 % (ref 36.0–46.0)
Hemoglobin: 14.5 g/dL (ref 12.0–15.0)
Lymphocytes Relative: 20.9 % (ref 12.0–46.0)
Lymphs Abs: 1.7 10*3/uL (ref 0.7–4.0)
MCHC: 34.8 g/dL (ref 30.0–36.0)
MCV: 89.6 fl (ref 78.0–100.0)
MONOS PCT: 7.1 % (ref 3.0–12.0)
Monocytes Absolute: 0.6 10*3/uL (ref 0.1–1.0)
NEUTROS PCT: 71.2 % (ref 43.0–77.0)
Neutro Abs: 5.6 10*3/uL (ref 1.4–7.7)
PLATELETS: 243 10*3/uL (ref 150.0–400.0)
RBC: 4.65 Mil/uL (ref 3.87–5.11)
RDW: 12.9 % (ref 11.5–14.6)
WBC: 7.9 10*3/uL (ref 4.5–10.5)

## 2013-07-03 LAB — POCT RAPID STREP A (OFFICE): Rapid Strep A Screen: NEGATIVE

## 2013-07-03 MED ORDER — LORATADINE 10 MG PO TABS: 10.0000 mg | ORAL_TABLET | Freq: Every day | ORAL | Status: DC | PRN

## 2013-07-03 MED ORDER — AZITHROMYCIN 250 MG PO TABS: ORAL_TABLET | ORAL | Status: DC

## 2013-07-03 NOTE — Patient Instructions (Signed)

## 2013-07-03 NOTE — Assessment & Plan Note (Signed)
claritin qd prn

## 2013-07-03 NOTE — Progress Notes (Signed)
   Subjective:    Patient ID: Molly Quinn, female    DOB: 29-Jan-1976, 38 y.o.   MRN: [id number]  URI  This is a new problem. The current episode started in the past 7 days. The problem has been gradually worsening. There has been no fever. Associated symptoms include coughing, rhinorrhea and a sore throat. Pertinent negatives include no abdominal pain, congestion, headaches, nausea or rash. The treatment provided no relief.      Review of Systems  Constitutional: Negative for chills, activity change, appetite change, fatigue and unexpected weight change.  HENT: Positive for rhinorrhea and sore throat. Negative for congestion, mouth sores and sinus pressure.   Eyes: Negative for visual disturbance.  Respiratory: Positive for cough. Negative for chest tightness.   Gastrointestinal: Negative for nausea and abdominal pain.  Genitourinary: Negative for frequency, difficulty urinating and vaginal pain.  Musculoskeletal: Negative for back pain and gait problem.  Skin: Negative for pallor and rash.  Neurological: Negative for dizziness, tremors, weakness, numbness and headaches.  Psychiatric/Behavioral: Negative for confusion and sleep disturbance.       Objective:   Physical Exam  Constitutional: She appears well-developed. No distress.  HENT:  Head: Normocephalic.  Right Ear: External ear normal.  Left Ear: External ear normal.  Nose: Nose normal.  Mouth/Throat: Oropharynx is clear and moist. No oropharyngeal exudate.  eryth throat  Eyes: Conjunctivae are normal. Pupils are equal, round, and reactive to light. Right eye exhibits no discharge. Left eye exhibits no discharge.  Neck: Normal range of motion. Neck supple. No JVD present. No tracheal deviation present. No thyromegaly present.  Cardiovascular: Normal rate, regular rhythm and normal heart sounds.   Pulmonary/Chest: No stridor. No respiratory distress. She has no wheezes.  Abdominal: Soft. Bowel sounds are normal. She  exhibits no distension and no mass. There is no tenderness. There is no rebound and no guarding.  Musculoskeletal: She exhibits no edema and no tenderness.  Lymphadenopathy:    She has no cervical adenopathy.  Neurological: She displays normal reflexes. No cranial nerve deficit. She exhibits normal muscle tone. Coordination normal.  Skin: No rash noted. No erythema.  Psychiatric: She has a normal mood and affect. Her behavior is normal. Judgment and thought content normal.          Assessment & Plan:

## 2013-07-03 NOTE — Assessment & Plan Note (Signed)
Likely viral  Z pac if worse Labs

## 2013-07-03 NOTE — Progress Notes (Signed)
Pre visit review using our clinic review tool, if applicable. No additional management support is needed unless otherwise documented below in the visit note. 

## 2013-07-04 ENCOUNTER — Other Ambulatory Visit (INDEPENDENT_AMBULATORY_CARE_PROVIDER_SITE_OTHER): Payer: BC Managed Care – PPO

## 2013-07-04 DIAGNOSIS — J020 Streptococcal pharyngitis: Secondary | ICD-10-CM

## 2013-07-04 DIAGNOSIS — J069 Acute upper respiratory infection, unspecified: Secondary | ICD-10-CM

## 2013-07-04 DIAGNOSIS — J309 Allergic rhinitis, unspecified: Secondary | ICD-10-CM

## 2013-07-04 LAB — ALLERGY PROFILE REGION II-DC, DE, MD, ~~LOC~~, VA
Aspergillus fumigatus, m3: <0.1 kU/L
Cat Dander: <0.1 kU/L
Cockroach: <0.1 kU/L
Elm IgE: <0.1 kU/L
IgE (Immunoglobulin E), Serum: <1.5 IU/mL (ref 0.0–180.0)
Johnson Grass: <0.1 kU/L
Lamb's Quarters: <0.1 kU/L
Meadow Grass: <0.1 kU/L

## 2013-07-04 LAB — MONONUCLEOSIS SCREEN: MONO SCREEN: NEGATIVE

## 2013-07-10 ENCOUNTER — Other Ambulatory Visit: Payer: Self-pay | Admitting: *Deleted

## 2013-11-14 ENCOUNTER — Telehealth: Payer: Self-pay | Admitting: Oncology

## 2013-11-14 NOTE — Telephone Encounter (Signed)
S/w pt advised appt time chg on 9/22 due to md on call. Pt verbalized understanding.

## 2013-12-02 ENCOUNTER — Ambulatory Visit (HOSPITAL_BASED_OUTPATIENT_CLINIC_OR_DEPARTMENT_OTHER): Payer: BC Managed Care – PPO | Admitting: Oncology

## 2013-12-02 ENCOUNTER — Telehealth: Payer: Self-pay | Admitting: *Deleted

## 2013-12-02 ENCOUNTER — Other Ambulatory Visit (HOSPITAL_BASED_OUTPATIENT_CLINIC_OR_DEPARTMENT_OTHER): Payer: BC Managed Care – PPO

## 2013-12-02 ENCOUNTER — Telehealth: Payer: Self-pay | Admitting: Oncology

## 2013-12-02 DIAGNOSIS — G40909 Epilepsy, unspecified, not intractable, without status epilepticus: Secondary | ICD-10-CM

## 2013-12-02 LAB — FERRITIN CHCC: Ferritin: 53 ng/ml (ref 9–269)

## 2013-12-02 NOTE — Telephone Encounter (Signed)
Pt confirmed labs/ov per 09/22 POF, gave pt AVS.....KJ °

## 2013-12-02 NOTE — Telephone Encounter (Signed)
Called pt with ferritin results. She voiced understanding. Will follow up as scheduled.

## 2013-12-02 NOTE — Telephone Encounter (Signed)
Message copied by Brien Few on Tue Dec 02, 2013  5:24 PM ------      Message from: Ladell Pier      Created: Tue Dec 02, 2013  1:42 PM       Please call patient, ferritin is ok, f/u as scheduled ------

## 2013-12-02 NOTE — Progress Notes (Signed)
  [place] OFFICE PROGRESS NOTE   Diagnosis: Hereditary hemochromatosis  INTERVAL HISTORY:   She returns as scheduled. She reports "congestion "for the past year. She has nasal and sinus drainage. She was seen by internal medicine and had allergy testing.  No other complaint.  Objective:  Vital signs in last 24 hours:  Blood pressure 122/77, pulse 70, temperature 98.2 F (36.8 C), temperature source Oral, resp. rate 20, height 5\' 3"  (1.6 m), weight 198 lb 6.4 oz (89.994 kg).    HEENT: No sinus tenderness. Resp: Lungs clear bilaterally Cardio: Regular rate and rhythm GI: No hepatosplenomegaly Vascular: No leg edema   Lab Results:  Lab Results  Component Value Date   WBC 7.9 07/03/2013   HGB 14.5 07/03/2013   HCT 41.7 07/03/2013   MCV 89.6 07/03/2013   PLT 243.0 07/03/2013   NEUTROABS 5.6 07/03/2013   05/29/2013-ferritin 33  Imaging:  No results found.  Medications: I have reviewed the patient's current medications.  Assessment/Plan: 1. Hereditary hemochromatosis: She last underwent phlebotomy therapy in July 2011. The ferritin level remains in the goal range.  2. Seizure disorder: Maintained on Lamictal. 3. History of an abnormal liver ultrasound: Followed with repeat imaging studies by Dr. Olevia Perches. 4. Status post cesarean section delivery of a female baby in August 2012.   Disposition:  We will followup on the ferritin level from today. The plan is to follow her with observation as long as the ferritin remains at less than 100. She will be scheduled for a ferritin level in 6 months and an office visit in one year. I encouraged her to followup with internal medicine for further evaluation of the upper respiratory symptoms.  Betsy Coder, MD  12/02/2013  12:18 PM

## 2013-12-26 ENCOUNTER — Other Ambulatory Visit: Payer: Self-pay

## 2014-01-12 ENCOUNTER — Encounter: Payer: Self-pay | Admitting: Internal Medicine

## 2014-01-14 ENCOUNTER — Ambulatory Visit (INDEPENDENT_AMBULATORY_CARE_PROVIDER_SITE_OTHER): Payer: BC Managed Care – PPO | Admitting: Internal Medicine

## 2014-01-14 ENCOUNTER — Encounter: Payer: Self-pay | Admitting: Internal Medicine

## 2014-01-14 VITALS — BP 112/78 | HR 78 | Temp 97.2°F | Resp 16 | Ht 63.0 in | Wt 194.0 lb

## 2014-01-14 DIAGNOSIS — J302 Other seasonal allergic rhinitis: Secondary | ICD-10-CM

## 2014-01-14 MED ORDER — METHYLPREDNISOLONE ACETATE 80 MG/ML IJ SUSP
80.0000 mg | Freq: Once | INTRAMUSCULAR | Status: AC
  Administered 2014-01-14: 80 mg via INTRAMUSCULAR

## 2014-01-14 MED ORDER — CETIRIZINE-PSEUDOEPHEDRINE ER 5-120 MG PO TB12: 1.0000 | ORAL_TABLET | Freq: Two times a day (BID) | ORAL | Status: DC

## 2014-01-14 MED ORDER — MOMETASONE FUROATE 50 MCG/ACT NA SUSP: 2.0000 | Freq: Every day | NASAL | Status: DC

## 2014-01-14 MED ORDER — MOMETASONE FUROATE 50 MCG/ACT NA SUSP: 4.0000 | Freq: Every day | NASAL | Status: DC

## 2014-01-14 NOTE — Progress Notes (Signed)
Subjective:    Patient ID: Molly Quinn, female    DOB: [birth year], 38 y.o.   MRN: [id number]  Sinusitis This is a new problem. The current episode started in the past 7 days. The problem is unchanged. There has been no fever. The fever has been present for less than 1 day. Her pain is at a severity of 0/10. She is experiencing no pain. Associated symptoms include congestion, ear pain ("popping" in her right ear) and sneezing. Pertinent negatives include no chills, coughing, diaphoresis, headaches, hoarse voice, neck pain, shortness of breath, sinus pressure, sore throat or swollen glands. Past treatments include spray decongestants. The treatment provided moderate relief.      Review of Systems  Constitutional: Negative.  Negative for fever, chills, diaphoresis, activity change, appetite change and fatigue.  HENT: Positive for congestion, ear pain ("popping" in her right ear), postnasal drip, rhinorrhea and sneezing. Negative for ear discharge, hoarse voice, nosebleeds, sinus pressure, sore throat, tinnitus, trouble swallowing and voice change.   Eyes: Negative.   Respiratory: Negative.  Negative for apnea, cough, choking, chest tightness, shortness of breath, wheezing and stridor.   Cardiovascular: Negative.  Negative for chest pain, palpitations and leg swelling.  Gastrointestinal: Negative.  Negative for abdominal pain.  Endocrine: Negative.   Genitourinary: Negative.   Musculoskeletal: Negative.  Negative for myalgias, back pain, arthralgias and neck pain.  Skin: Negative.  Negative for rash.  Allergic/Immunologic: Negative.   Neurological: Negative.  Negative for headaches.  Hematological: Negative.   Psychiatric/Behavioral: Negative.        Objective:   Physical Exam  Constitutional: She is oriented to person, place, and time. She appears well-developed and well-nourished.  Non-toxic appearance. She does not have a sickly appearance. She does not appear ill. No distress.    HENT:  Head: Normocephalic and atraumatic.  Right Ear: Hearing, external ear and ear canal normal. Tympanic membrane is erythematous. Tympanic membrane is not scarred, not perforated, not retracted and not bulging. Tympanic membrane mobility is normal.  Left Ear: Hearing, tympanic membrane, external ear and ear canal normal.  Nose: Mucosal edema and rhinorrhea present. No sinus tenderness or nasal deformity. No epistaxis.  No foreign bodies. Right sinus exhibits no maxillary sinus tenderness and no frontal sinus tenderness. Left sinus exhibits no maxillary sinus tenderness and no frontal sinus tenderness.  Mouth/Throat: Oropharynx is clear and moist and mucous membranes are normal. Mucous membranes are not pale, not dry and not cyanotic. No oral lesions. No trismus in the jaw. No uvula swelling. No oropharyngeal exudate, posterior oropharyngeal edema, posterior oropharyngeal erythema or tonsillar abscesses.  Eyes: Conjunctivae are normal. Right eye exhibits no discharge. Left eye exhibits no discharge. No scleral icterus.  Neck: Normal range of motion. Neck supple. No JVD present. No tracheal deviation present. No thyromegaly present.  Cardiovascular: Normal rate, regular rhythm, normal heart sounds and intact distal pulses.  Exam reveals no gallop and no friction rub.   No murmur heard. Pulmonary/Chest: Effort normal and breath sounds normal. No stridor. No respiratory distress. She has no wheezes. She has no rales. She exhibits no tenderness.  Abdominal: Soft. Bowel sounds are normal. She exhibits no distension and no mass. There is no tenderness. There is no rebound and no guarding.  Musculoskeletal: Normal range of motion. She exhibits no edema or tenderness.  Lymphadenopathy:    She has no cervical adenopathy.  Neurological: She is oriented to person, place, and time.  Skin: Skin is warm and dry. No rash noted. She  is not diaphoretic. No erythema. No pallor.  Vitals reviewed.          Assessment & Plan:

## 2014-01-14 NOTE — Progress Notes (Signed)
Pre visit review using our clinic review tool, if applicable. No additional management support is needed unless otherwise documented below in the visit note. 

## 2014-01-14 NOTE — Patient Instructions (Signed)
Barotitis Media Barotitis media is inflammation of your middle ear. This occurs when the auditory tube (eustachian tube) leading from the back of your nose (nasopharynx) to your eardrum is blocked. This blockage may result from a cold, environmental allergies, or an upper respiratory infection. Unresolved barotitis media may lead to damage or hearing loss (barotrauma), which may become permanent. HOME CARE INSTRUCTIONS   Use medicines as recommended by your health care provider. Over-the-counter medicines will help unblock the canal and can help during times of air travel.  Do not put anything into your ears to clean or unplug them. Eardrops will not be helpful.  Do not swim, dive, or fly until your health care provider says it is all right to do so. If these activities are necessary, chewing gum with frequent, forceful swallowing may help. It is also helpful to hold your nose and gently blow to pop your ears for equalizing pressure changes. This forces air into the eustachian tube.  Only take over-the-counter or prescription medicines for pain, discomfort, or fever as directed by your health care provider.  A decongestant may be helpful in decongesting the middle ear and make pressure equalization easier. SEEK MEDICAL CARE IF:  You experience a serious form of dizziness in which you feel as if the room is spinning and you feel nauseated (vertigo).  Your symptoms only involve one ear. SEEK IMMEDIATE MEDICAL CARE IF:   You develop a severe headache, dizziness, or severe ear pain.  You have bloody or pus-like drainage from your ears.  You develop a fever.  Your problems do not improve or become worse. MAKE SURE YOU:   Understand these instructions.  Will watch your condition.  Will get help right away if you are not doing well or get worse. Document Released: 02/25/2000 Document Revised: 12/18/2012 Document Reviewed: 09/24/2012 ExitCare Patient Information 2015 [employer]. This  information is not intended to replace advice given to you by your health care provider. Make sure you discuss any questions you have with your health care provider.  

## 2014-01-14 NOTE — Assessment & Plan Note (Signed)
There is no evidence of a bacterial infection today so antibiotics were not prescribed She has a history or AR and has an acute viral URI over the last week She has s/s of eustachian tube dysfunction so I gave her an injection of depo-medrol IM I have asked her to start nasonex and zyrtec-d as well

## 2014-03-07 ENCOUNTER — Other Ambulatory Visit: Payer: Self-pay | Admitting: Internal Medicine

## 2014-03-10 ENCOUNTER — Ambulatory Visit (INDEPENDENT_AMBULATORY_CARE_PROVIDER_SITE_OTHER): Payer: BC Managed Care – PPO | Admitting: Family

## 2014-03-10 ENCOUNTER — Encounter: Payer: Self-pay | Admitting: Family

## 2014-03-10 VITALS — BP 144/92 | HR 73 | Temp 98.5°F | Resp 18 | Ht 63.5 in | Wt 190.1 lb

## 2014-03-10 DIAGNOSIS — R059 Cough, unspecified: Secondary | ICD-10-CM | POA: Insufficient documentation

## 2014-03-10 DIAGNOSIS — R05 Cough: Secondary | ICD-10-CM

## 2014-03-10 MED ORDER — AZITHROMYCIN 250 MG PO TABS: ORAL_TABLET | ORAL | Status: DC

## 2014-03-10 MED ORDER — ALPRAZOLAM 0.5 MG PO TABS: 0.2500 mg | ORAL_TABLET | ORAL | Status: DC | PRN

## 2014-03-10 MED ORDER — BENZONATATE 100 MG PO CAPS: 100.0000 mg | ORAL_CAPSULE | Freq: Three times a day (TID) | ORAL | Status: DC | PRN

## 2014-03-10 MED ORDER — HYDROCODONE-HOMATROPINE 5-1.5 MG/5ML PO SYRP: 5.0000 mL | ORAL_SOLUTION | Freq: Three times a day (TID) | ORAL | Status: DC | PRN

## 2014-03-10 NOTE — Patient Instructions (Signed)
Thank you for choosing [facility].  Summary/Instructions:  Your prescription(s) have been submitted to your pharmacy. Please take as directed and contact our office if you believe you are having problem(s) with the medication(s).  If your symptoms worsen or fail to improve, please contact our office for further instruction, or in case of emergency go directly to the emergency room at the closest medical facility.   For your cough - OTC - Delsym or Robitussin

## 2014-03-10 NOTE — Progress Notes (Signed)
Pre visit review using our clinic review tool, if applicable. No additional management support is needed unless otherwise documented below in the visit note. 

## 2014-03-10 NOTE — Progress Notes (Signed)
   Subjective:    Patient ID: Molly Quinn, female    DOB: 1975-06-22, 38 y.o.   MRN: [id number]  Chief Complaint  Patient presents with  . Fever    Fever and productive cough, and body aches, x4 days    HPI:  Molly Quinn is a 38 y.o. female who presents today for an acute visit.   Acute symptoms started about 4 days ago. Currently experiencing cough, difficulty talking, and low energy. Has attempted tylenol, alkaseltzer plus and benedryl which provided minimal to no relief. Indicates feeling worse at night. Intensity of the cough is enough to keep her up periodically.    Allergies  Allergen Reactions  . Phenytoin Rash and Other (See Comments)    Reaction: pt passes out  . Sulfa Antibiotics Rash    Current Outpatient Prescriptions on File Prior to Visit  Medication Sig Dispense Refill  . ALPRAZolam (XANAX) 0.5 MG tablet Take 0.25-0.5 mg by mouth as needed for anxiety (for flight anxiety).    . [employer] ALLERGY RELIEF-D 5-120 MG per tablet TAKE 1 TABLET BY MOUTH 2 (TWO) TIMES DAILY. 48 tablet 11  . lamoTRIgine (LAMICTAL) 100 MG tablet Take 300 mg by mouth at bedtime.     . mometasone (NASONEX) 50 MCG/ACT nasal spray Place 4 sprays into the nose daily. 17 g 12   No current facility-administered medications on file prior to visit.    Review of Systems  Constitutional: Positive for fever and chills.  HENT: Positive for congestion. Negative for sinus pressure, sneezing and sore throat.   Respiratory: Negative for chest tightness, shortness of breath and wheezing.   Gastrointestinal: Negative for nausea and vomiting.      Objective:    BP 144/92 mmHg  Pulse 73  Temp(Src) 98.5 F (36.9 C) (Oral)  Resp 18  Ht 5' 3.5" (1.613 m)  Wt 190 lb 1.9 oz (86.238 kg)  BMI 33.15 kg/m2  SpO2 98% Nursing note and vital signs reviewed.  Physical Exam  Constitutional: She is oriented to person, place, and time. She appears well-developed and well-nourished. No distress.  HENT:    Right Ear: Hearing, tympanic membrane, external ear and ear canal normal.  Left Ear: Hearing, tympanic membrane, external ear and ear canal normal.  Nose: Nose normal. Right sinus exhibits no maxillary sinus tenderness and no frontal sinus tenderness. Left sinus exhibits no maxillary sinus tenderness and no frontal sinus tenderness.  Mouth/Throat: Uvula is midline, oropharynx is clear and moist and mucous membranes are normal.  Cardiovascular: Normal rate, regular rhythm, normal heart sounds and intact distal pulses.   Pulmonary/Chest: Effort normal and breath sounds normal.  Neurological: She is alert and oriented to person, place, and time.  Skin: Skin is warm and dry.  Psychiatric: She has a normal mood and affect. Her behavior is normal. Judgment and thought content normal.       Assessment & Plan:

## 2014-03-10 NOTE — Assessment & Plan Note (Signed)
Symptoms and exam consistent with developing bronchitis or resolving upper respiratory infection. Given worsening of cough over the last 48 hours favoring more of a developing bronchitis. Patient given written prescription for azithromycin and instructed to wait 24-48 hours to note any improvements. If symptoms worsen then begin antibiotic. Start Hycodan as needed for cough and sleep and start Tessalon as needed for cough. Continue over-the-counter medications as needed for symptom relief and  follow up if symptoms worsen or fail to improve.

## 2014-04-01 ENCOUNTER — Other Ambulatory Visit (INDEPENDENT_AMBULATORY_CARE_PROVIDER_SITE_OTHER): Payer: BLUE CROSS/BLUE SHIELD

## 2014-04-01 ENCOUNTER — Encounter: Payer: Self-pay | Admitting: Internal Medicine

## 2014-04-01 ENCOUNTER — Telehealth: Payer: Self-pay | Admitting: Internal Medicine

## 2014-04-01 ENCOUNTER — Ambulatory Visit (INDEPENDENT_AMBULATORY_CARE_PROVIDER_SITE_OTHER): Payer: BLUE CROSS/BLUE SHIELD | Admitting: Internal Medicine

## 2014-04-01 VITALS — BP 142/90 | HR 89 | Temp 98.1°F | Wt 188.0 lb

## 2014-04-01 DIAGNOSIS — R5383 Other fatigue: Secondary | ICD-10-CM

## 2014-04-01 DIAGNOSIS — R1032 Left lower quadrant pain: Secondary | ICD-10-CM

## 2014-04-01 DIAGNOSIS — G40802 Other epilepsy, not intractable, without status epilepticus: Secondary | ICD-10-CM

## 2014-04-01 DIAGNOSIS — R569 Unspecified convulsions: Secondary | ICD-10-CM

## 2014-04-01 LAB — BASIC METABOLIC PANEL
BUN: 9 mg/dL (ref 6–23)
CALCIUM: 9.6 mg/dL (ref 8.4–10.5)
CHLORIDE: 103 meq/L (ref 96–112)
CO2: 30 mEq/L (ref 19–32)
Creatinine, Ser: 0.87 mg/dL (ref 0.40–1.20)
GFR: 76.74 mL/min (ref >60.00)
Glucose, Bld: 97 mg/dL (ref 70–99)
Potassium: 4.4 mEq/L (ref 3.5–5.1)
Sodium: 138 mEq/L (ref 135–145)

## 2014-04-01 LAB — CBC WITH DIFFERENTIAL/PLATELET
BASOS PCT: 0.6 % (ref 0.0–3.0)
Basophils Absolute: 0 10*3/uL (ref 0.0–0.1)
Eosinophils Absolute: 0 10*3/uL (ref 0.0–0.7)
Eosinophils Relative: 0 % (ref 0.0–5.0)
HCT: 41.4 % (ref 36.0–46.0)
HEMOGLOBIN: 14.4 g/dL (ref 12.0–15.0)
Lymphocytes Relative: 29.2 % (ref 12.0–46.0)
Lymphs Abs: 1.9 10*3/uL (ref 0.7–4.0)
MCHC: 34.9 g/dL (ref 30.0–36.0)
MCV: 89.8 fl (ref 78.0–100.0)
MONOS PCT: 5.3 % (ref 3.0–12.0)
Monocytes Absolute: 0.3 10*3/uL (ref 0.1–1.0)
Neutro Abs: 4.2 10*3/uL (ref 1.4–7.7)
Neutrophils Relative %: 64.9 % (ref 43.0–77.0)
Platelets: 292 10*3/uL (ref 150.0–400.0)
RBC: 4.61 Mil/uL (ref 3.87–5.11)
RDW: 12.9 % (ref 11.5–15.5)
WBC: 6.5 10*3/uL (ref 4.0–10.5)

## 2014-04-01 LAB — HEPATIC FUNCTION PANEL
ALT: 24 U/L (ref 0–35)
AST: 20 U/L (ref 0–37)
Albumin: 4.5 g/dL (ref 3.5–5.2)
Alkaline Phosphatase: 81 U/L (ref 39–117)
BILIRUBIN TOTAL: 0.5 mg/dL (ref 0.2–1.2)
Bilirubin, Direct: 0.1 mg/dL (ref 0.0–0.3)
Total Protein: 6.5 g/dL (ref 6.0–8.3)

## 2014-04-01 LAB — VITAMIN D 25 HYDROXY (VIT D DEFICIENCY, FRACTURES): VITD: 7.97 ng/mL — AB (ref 30.00–100.00)

## 2014-04-01 LAB — TSH: TSH: 1.76 u[IU]/mL (ref 0.35–4.50)

## 2014-04-01 LAB — IBC PANEL
Iron: 119 ug/dL (ref 42–145)
SATURATION RATIOS: 39.7 % (ref 20.0–50.0)
Transferrin: 214 mg/dL (ref 212.0–360.0)

## 2014-04-01 LAB — CORTISOL: Cortisol, Plasma: 6.2 ug/dL

## 2014-04-01 LAB — VITAMIN B12: VITAMIN B 12: 476 pg/mL (ref 211–911)

## 2014-04-01 MED ORDER — ERGOCALCIFEROL 1.25 MG (50000 UT) PO CAPS: 50000.0000 [IU] | ORAL_CAPSULE | ORAL | Status: DC

## 2014-04-01 MED ORDER — VITAMIN D3 50 MCG (2000 UT) PO CAPS: 2000.0000 [IU] | ORAL_CAPSULE | Freq: Every day | ORAL | Status: DC

## 2014-04-01 NOTE — Telephone Encounter (Signed)
Pt stated you agree to be here PCP. Please verify this? Thank you

## 2014-04-01 NOTE — Patient Instructions (Signed)
Gluten free trial (no wheat products) for 4-6 weeks. OK to use gluten-free bread and gluten-free pasta.  Milk free trial (no milk, ice cream, cheese and yogurt) for 4-6 weeks. OK to use almond, coconut, rice or soy milk. "Almond breeze" brand tastes good.  

## 2014-04-01 NOTE — Assessment & Plan Note (Signed)
1/16 LLQ>RLQ CT abd Labs

## 2014-04-01 NOTE — Assessment & Plan Note (Signed)
Continue with current prescription therapy as reflected on the Med list.  

## 2014-04-01 NOTE — Telephone Encounter (Signed)
Ok Thx 

## 2014-04-01 NOTE — Progress Notes (Signed)
Pre visit review using our clinic review tool, if applicable. No additional management support is needed unless otherwise documented below in the visit note. 

## 2014-04-01 NOTE — Assessment & Plan Note (Signed)
Labs Not on phlebotomies x 2 years

## 2014-04-01 NOTE — Progress Notes (Signed)
   Subjective:    Patient ID: Molly Quinn, female    DOB: June 10, 1975, 39 y.o.   MRN: [id number]  Abdominal Pain This is a new problem. The current episode started more than 1 month ago. The onset quality is gradual. The problem occurs intermittently. The problem has been unchanged. The pain is located in the LLQ. The pain is at a severity of 3/10 (sharp pain one time). The pain is mild. Associated symptoms include anorexia. Pertinent negatives include no frequency. She has tried nothing for the symptoms.   Wt Readings from Last 3 Encounters:  04/01/14 188 lb (85.276 kg)  03/10/14 190 lb 1.9 oz (86.238 kg)  01/14/14 194 lb (87.998 kg)   BP Readings from Last 3 Encounters:  04/01/14 142/90  03/10/14 144/92  01/14/14 112/78       Review of Systems  Constitutional: Negative for chills, activity change, appetite change, fatigue and unexpected weight change.  HENT: Negative for mouth sores and sinus pressure.   Eyes: Negative for visual disturbance.  Respiratory: Negative for chest tightness.   Gastrointestinal: Positive for abdominal pain and anorexia.  Genitourinary: Negative for frequency, difficulty urinating and vaginal pain.  Musculoskeletal: Negative for back pain and gait problem.  Skin: Negative for pallor.  Neurological: Negative for dizziness, tremors, weakness and numbness.  Psychiatric/Behavioral: Negative for confusion and sleep disturbance.       Objective:   Physical Exam  Constitutional: She appears well-developed. No distress.  HENT:  Head: Normocephalic.  Right Ear: External ear normal.  Left Ear: External ear normal.  Nose: Nose normal.  Mouth/Throat: Oropharynx is clear and moist. No oropharyngeal exudate.  eryth throat  Eyes: Conjunctivae are normal. Pupils are equal, round, and reactive to light. Right eye exhibits no discharge. Left eye exhibits no discharge.  Neck: Normal range of motion. Neck supple. No JVD present. No tracheal deviation present.  No thyromegaly present.  Cardiovascular: Normal rate, regular rhythm and normal heart sounds.   Pulmonary/Chest: No stridor. No respiratory distress. She has no wheezes.  Abdominal: Soft. Bowel sounds are normal. She exhibits no distension and no mass. There is no tenderness. There is no rebound and no guarding.  Musculoskeletal: She exhibits no edema or tenderness.  Lymphadenopathy:    She has no cervical adenopathy.  Neurological: She displays normal reflexes. No cranial nerve deficit. She exhibits normal muscle tone. Coordination normal.  Skin: No rash noted. No erythema.  Psychiatric: She has a normal mood and affect. Her behavior is normal. Judgment and thought content normal.  No abd mass No rebound sx's Tender LLQ>RLQ        Assessment & Plan:  Patient ID: Molly Quinn, female   DOB: 1975/09/14, 39 y.o.   MRN: [id number]

## 2014-04-01 NOTE — Assessment & Plan Note (Signed)
Labs

## 2014-04-02 LAB — PREGNANCY, URINE: Preg Test, Ur: NEGATIVE

## 2014-04-09 ENCOUNTER — Ambulatory Visit (INDEPENDENT_AMBULATORY_CARE_PROVIDER_SITE_OTHER)
Admission: RE | Admit: 2014-04-09 | Discharge: 2014-04-09 | Disposition: A | Discharge status: Home / Self Care | Payer: BLUE CROSS/BLUE SHIELD | Source: Ambulatory Visit | Attending: Internal Medicine | Admitting: Internal Medicine

## 2014-04-09 DIAGNOSIS — R1032 Left lower quadrant pain: Secondary | ICD-10-CM

## 2014-04-09 MED ORDER — IOHEXOL 300 MG/ML  SOLN
100.0000 mL | Freq: Once | INTRAMUSCULAR | Status: AC | PRN
  Administered 2014-04-09: 100 mL via INTRAVENOUS

## 2014-04-13 ENCOUNTER — Other Ambulatory Visit: Payer: Self-pay | Admitting: Internal Medicine

## 2014-04-13 MED ORDER — IBUPROFEN 600 MG PO TABS: 600.0000 mg | ORAL_TABLET | Freq: Three times a day (TID) | ORAL | Status: DC

## 2014-04-15 ENCOUNTER — Ambulatory Visit: Payer: BLUE CROSS/BLUE SHIELD | Admitting: Internal Medicine

## 2014-04-15 ENCOUNTER — Ambulatory Visit (INDEPENDENT_AMBULATORY_CARE_PROVIDER_SITE_OTHER): Payer: BLUE CROSS/BLUE SHIELD | Admitting: Internal Medicine

## 2014-04-15 ENCOUNTER — Encounter: Payer: Self-pay | Admitting: Internal Medicine

## 2014-04-15 VITALS — BP 120/80 | HR 65 | Temp 98.1°F | Wt 185.0 lb

## 2014-04-15 DIAGNOSIS — K589 Irritable bowel syndrome without diarrhea: Secondary | ICD-10-CM

## 2014-04-15 DIAGNOSIS — D259 Leiomyoma of uterus, unspecified: Secondary | ICD-10-CM

## 2014-04-15 DIAGNOSIS — R1032 Left lower quadrant pain: Secondary | ICD-10-CM

## 2014-04-15 NOTE — Progress Notes (Signed)
   Subjective:    Patient ID: Molly Quinn, female    DOB: 03-Dec-1975, 39 y.o.   MRN: [id number]   F/u abd pain  Abdominal Pain This is a new problem. The current episode started more than 1 month ago. The onset quality is gradual. The problem occurs intermittently. The problem has been gradually improving. The pain is located in the LLQ. The pain is at a severity of 2/10 (sharp pain one time). The pain is mild. The quality of the pain is dull. Pertinent negatives include no anorexia or frequency. She has tried nothing for the symptoms.   Wt Readings from Last 3 Encounters:  04/15/14 185 lb (83.915 kg)  04/01/14 188 lb (85.276 kg)  03/10/14 190 lb 1.9 oz (86.238 kg)   BP Readings from Last 3 Encounters:  04/15/14 120/80  04/01/14 142/90  03/10/14 144/92       Review of Systems  Constitutional: Negative for chills, activity change, appetite change, fatigue and unexpected weight change.  HENT: Negative for mouth sores and sinus pressure.   Eyes: Negative for visual disturbance.  Respiratory: Negative for chest tightness.   Gastrointestinal: Positive for abdominal pain. Negative for anorexia.  Genitourinary: Negative for frequency, difficulty urinating and vaginal pain.  Musculoskeletal: Negative for back pain and gait problem.  Skin: Negative for pallor.  Neurological: Negative for dizziness, tremors, weakness and numbness.  Psychiatric/Behavioral: Negative for confusion and sleep disturbance.       Objective:   Physical Exam  Constitutional: She appears well-developed. No distress.  HENT:  Head: Normocephalic.  Right Ear: External ear normal.  Left Ear: External ear normal.  Nose: Nose normal.  Mouth/Throat: Oropharynx is clear and moist. No oropharyngeal exudate.  eryth throat  Eyes: Conjunctivae are normal. Pupils are equal, round, and reactive to light. Right eye exhibits no discharge. Left eye exhibits no discharge.  Neck: Normal range of motion. Neck supple. No  JVD present. No tracheal deviation present. No thyromegaly present.  Cardiovascular: Normal rate, regular rhythm and normal heart sounds.   Pulmonary/Chest: No stridor. No respiratory distress. She has no wheezes.  Abdominal: Soft. Bowel sounds are normal. She exhibits no distension and no mass. There is no tenderness. There is no rebound and no guarding.  Musculoskeletal: She exhibits no edema or tenderness.  Lymphadenopathy:    She has no cervical adenopathy.  Neurological: She displays normal reflexes. No cranial nerve deficit. She exhibits normal muscle tone. Coordination normal.  Skin: No rash noted. No erythema.  Psychiatric: She has a normal mood and affect. Her behavior is normal. Judgment and thought content normal.  No abd mass No rebound sx's Less tender LLQ>RLQ  04/09/14 abd CT IMPRESSION: 1. Mild left pelvic inflammatory changes, likely indicating mild diverticulitis or appendagitis epiploica. No evidence of bowel perforation or abscess. 2. The appendix appears normal. 3. Uterine fibroids.   Electronically Signed  By: Camie Patience M.D.  On: 04/09/2014 21:33        Assessment & Plan:  Patient ID: Molly Quinn, female   DOB: January 26, 1976, 39 y.o.   MRN: [id number]

## 2014-04-15 NOTE — Assessment & Plan Note (Signed)
GI consult w/Dr Olevia Perches: fam h/o colon ca

## 2014-04-15 NOTE — Assessment & Plan Note (Addendum)
04/09/14 abd CT IMPRESSION:  Reproductive: There is a posterior uterine fibroid measuring 3.2 cm. There is a small partially calcified anterior uterine fibroid. No adnexal mass.  F/u w/GYN

## 2014-04-15 NOTE — Progress Notes (Signed)
Pre visit review using our clinic review tool, if applicable. No additional management support is needed unless otherwise documented below in the visit note. 

## 2014-04-15 NOTE — Assessment & Plan Note (Signed)
1/16 LLQ CT showed  "appendagitis epiploica" . "Diverticulitis" is a less likely possibility with your clinical presentation.   Ibuprofen 1 tab 3 times a day x 10 days   GI consult w/Dr Olevia Perches: fam h/o colon ca

## 2014-04-15 NOTE — Assessment & Plan Note (Signed)
GI consult w/Dr Ralene Muskrat are good

## 2014-04-16 ENCOUNTER — Other Ambulatory Visit: Payer: Self-pay | Admitting: Internal Medicine

## 2014-04-16 MED ORDER — IBUPROFEN 600 MG PO TABS: 600.0000 mg | ORAL_TABLET | Freq: Three times a day (TID) | ORAL | Status: DC

## 2014-05-15 ENCOUNTER — Encounter: Payer: Self-pay | Admitting: Internal Medicine

## 2014-05-22 ENCOUNTER — Other Ambulatory Visit (INDEPENDENT_AMBULATORY_CARE_PROVIDER_SITE_OTHER): Payer: BLUE CROSS/BLUE SHIELD

## 2014-05-22 ENCOUNTER — Ambulatory Visit (INDEPENDENT_AMBULATORY_CARE_PROVIDER_SITE_OTHER): Payer: BLUE CROSS/BLUE SHIELD | Admitting: Internal Medicine

## 2014-05-22 ENCOUNTER — Encounter: Payer: Self-pay | Admitting: Internal Medicine

## 2014-05-22 VITALS — BP 120/76 | HR 72 | Ht 63.25 in | Wt 184.1 lb

## 2014-05-22 DIAGNOSIS — K5732 Diverticulitis of large intestine without perforation or abscess without bleeding: Secondary | ICD-10-CM

## 2014-05-22 DIAGNOSIS — R194 Change in bowel habit: Secondary | ICD-10-CM

## 2014-05-22 LAB — CBC WITH DIFFERENTIAL/PLATELET
Basophils Absolute: 0.1 10*3/uL (ref 0.0–0.1)
Basophils Relative: 0.8 % (ref 0.0–3.0)
Eosinophils Absolute: 0 10*3/uL (ref 0.0–0.7)
Eosinophils Relative: 0.1 % (ref 0.0–5.0)
HCT: 42 % (ref 36.0–46.0)
HEMOGLOBIN: 14.4 g/dL (ref 12.0–15.0)
Lymphocytes Relative: 25.9 % (ref 12.0–46.0)
Lymphs Abs: 2.3 10*3/uL (ref 0.7–4.0)
MCHC: 34.2 g/dL (ref 30.0–36.0)
MCV: 90.6 fl (ref 78.0–100.0)
MONOS PCT: 5.6 % (ref 3.0–12.0)
Monocytes Absolute: 0.5 10*3/uL (ref 0.1–1.0)
Neutro Abs: 6.1 10*3/uL (ref 1.4–7.7)
Neutrophils Relative %: 67.6 % (ref 43.0–77.0)
PLATELETS: 297 10*3/uL (ref 150.0–400.0)
RBC: 4.64 Mil/uL (ref 3.87–5.11)
RDW: 12.8 % (ref 11.5–15.5)
WBC: 9 10*3/uL (ref 4.0–10.5)

## 2014-05-22 LAB — IBC PANEL
Iron: 150 ug/dL — ABNORMAL HIGH (ref 42–145)
SATURATION RATIOS: 53 % — AB (ref 20.0–50.0)
TRANSFERRIN: 202 mg/dL — AB (ref 212.0–360.0)

## 2014-05-22 LAB — FERRITIN: FERRITIN: 45.2 ng/mL (ref 10.0–291.0)

## 2014-05-22 LAB — IRON: Iron: 150 ug/dL — ABNORMAL HIGH (ref 42–145)

## 2014-05-22 MED ORDER — MOVIPREP 100 G PO SOLR: 1.0000 | Freq: Once | ORAL | Status: DC

## 2014-05-22 NOTE — Progress Notes (Signed)
Molly Quinn 1975/09/14 [id number]  Note: This dictation was prepared with Dragon digital system. Any transcriptional errors that result from this procedure are unintentional.   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 Positive for 282Y gene. Started on  phlebotomies in [place] and continued until about 2011.in [place] Followed by Dr. Learta Codding but not in last several years. She is here today because of the left lower quadrant abdominal pain and change in bowel habits. CT scan of the abdomen in January 2016  Suggested appendagitis versus diverticulitis. There was mild soft tissue stranding in the left perivesicular fat adjacent to the sigmoid colon. She has mild diverticulosis. The pain has mostly resolved but she is still remains very tender in left lower quadrant. There is a positive family history of ulcerative colitis in her brother who also has sclerosing cholangitis and cholangiocarcinoma. Her mother had ulcerative colitis. She had a colonoscopy about 10 years ago in [place] and it was apparently normal exam. She is concerned about her family history and risk for colon cancer. She had a normal pregnancy 3 years ago and now she is being treated for fibroid tumors of the uterus by Dr. Philis Pique    Past Medical History  Diagnosis Date  . Epilepsy dx 1991    no breakthru while on meds, last in 1994  . Hemochromatosis dx 03/2005 in [place]    phlebotomy prn - 1x/yr or less, cbc q78mo  . Hyperlipidemia   . IBS (irritable bowel syndrome)   . Anxiety     Past Surgical History  Procedure Laterality Date  . Lymph node removal    . Repair ankle ligament    . Cesarean section  11/08/2010    Procedure: CESAREAN SECTION;  Surgeon: Daria Pastures;  Location: [facility];  Service: Gynecology;  Laterality: N/A;    Allergies  Allergen Reactions  . Phenytoin Rash and Other (See Comments)    Reaction: pt  passes out  . Sulfa Antibiotics Rash    Family history and social history have been reviewed.  Review of Systems: Low abdominal pain. Diarrhea alternating with constipation. Denies rectal bleeding. Denies weight loss  The remainder of the 10 point ROS is negative except as outlined in the H&P  Physical Exam: General Appearance Well developed, in no distress Eyes  Non icteric  HEENT  Non traumatic, normocephalic  Mouth No lesion, tongue papillated, no cheilosis Neck Supple without adenopathy, thyroid not enlarged, no carotid bruits, no JVD Lungs Clear to auscultation bilaterally COR Normal S1, normal S2, regular rhythm, no murmur, quiet precordium Abdomen soft with normoactive bowel sounds. Liver percussed over 90 cm. Liver edge at costal margin. Splenic tip not palpable very tender in left lower quadrant and left middle quadrant. No rebound Rectal soft Hemoccult negative stool Extremities  No pedal edema Skin No lesions,mild palmar erythema Neurological Alert and oriented x 3 Psychological Normal mood and affect  Assessment and Plan:   39 year old white female with the left lower quadrant abdominal pain and inflammatory process attributed to appendagitisa but cannot rule out diverticulitis. She has a strong family history of inflammatory bowel disease in 2 direct  relatives. She has change in the bowel habits although she is Hemoccult-negative. We will proceed with colonoscopy  Hereditary homozygous hemochromatosis. Last iron saturation 39.7% 2 months ago. Hemoglobin 14.4. She has not had phlebotomies for at least 3 years. She will have to start the phlebotomies again and I encouraged her  to contact Dr. Benay Spice. We will check alpha-fetoprotein today. She will need upper abdominal ultrasound. We will be checking her ferritin, iron studies, alpha-fetoprotein today    Delfin Edis 05/22/2014

## 2014-05-22 NOTE — Patient Instructions (Addendum)
Your physician has requested that you go to the basement for lab work before leaving today  You have been scheduled for a colonoscopy. Please follow written instructions given to you at your visit today.  Please pick up your prep supplies at the pharmacy within the next 1-3 days. If you use inhalers (even only as needed), please bring them with you on the day of your procedure. Dr Alain Marion, Dr Julieanne Manson,

## 2014-05-23 LAB — AFP TUMOR MARKER: AFP-Tumor Marker: 4.1 ng/mL (ref <6.1)

## 2014-05-27 ENCOUNTER — Encounter (HOSPITAL_BASED_OUTPATIENT_CLINIC_OR_DEPARTMENT_OTHER): Payer: Self-pay | Admitting: *Deleted

## 2014-05-27 ENCOUNTER — Emergency Department (HOSPITAL_BASED_OUTPATIENT_CLINIC_OR_DEPARTMENT_OTHER)
Admission: EM | Admit: 2014-05-27 | Discharge: 2014-05-27 | Disposition: A | Discharge status: Home / Self Care | Payer: BLUE CROSS/BLUE SHIELD | Attending: Emergency Medicine | Admitting: Emergency Medicine

## 2014-05-27 DIAGNOSIS — Z79899 Other long term (current) drug therapy: Secondary | ICD-10-CM | POA: Diagnosis not present

## 2014-05-27 DIAGNOSIS — G40909 Epilepsy, unspecified, not intractable, without status epilepticus: Secondary | ICD-10-CM | POA: Diagnosis not present

## 2014-05-27 DIAGNOSIS — Z3202 Encounter for pregnancy test, result negative: Secondary | ICD-10-CM | POA: Diagnosis not present

## 2014-05-27 DIAGNOSIS — Y9389 Activity, other specified: Secondary | ICD-10-CM | POA: Diagnosis not present

## 2014-05-27 DIAGNOSIS — Y998 Other external cause status: Secondary | ICD-10-CM | POA: Insufficient documentation

## 2014-05-27 DIAGNOSIS — Z8639 Personal history of other endocrine, nutritional and metabolic disease: Secondary | ICD-10-CM | POA: Diagnosis not present

## 2014-05-27 DIAGNOSIS — S3992XA Unspecified injury of lower back, initial encounter: Secondary | ICD-10-CM | POA: Insufficient documentation

## 2014-05-27 DIAGNOSIS — Y9289 Other specified places as the place of occurrence of the external cause: Secondary | ICD-10-CM | POA: Diagnosis not present

## 2014-05-27 DIAGNOSIS — Z8719 Personal history of other diseases of the digestive system: Secondary | ICD-10-CM | POA: Insufficient documentation

## 2014-05-27 DIAGNOSIS — S8991XA Unspecified injury of right lower leg, initial encounter: Secondary | ICD-10-CM | POA: Insufficient documentation

## 2014-05-27 DIAGNOSIS — S199XXA Unspecified injury of neck, initial encounter: Secondary | ICD-10-CM | POA: Insufficient documentation

## 2014-05-27 LAB — URINALYSIS, ROUTINE W REFLEX MICROSCOPIC
BILIRUBIN URINE: NEGATIVE
Glucose, UA: NEGATIVE mg/dL
Hgb urine dipstick: NEGATIVE
KETONES UR: NEGATIVE mg/dL
LEUKOCYTES UA: NEGATIVE
NITRITE: NEGATIVE
PROTEIN: NEGATIVE mg/dL
Specific Gravity, Urine: 1.01 (ref 1.005–1.030)
UROBILINOGEN UA: 1 mg/dL (ref 0.0–1.0)
pH: 7.5 (ref 5.0–8.0)

## 2014-05-27 LAB — PREGNANCY, URINE: Preg Test, Ur: NEGATIVE

## 2014-05-27 MED ORDER — HYDROCODONE-ACETAMINOPHEN 5-325 MG PO TABS: 1.0000 | ORAL_TABLET | ORAL | Status: DC | PRN

## 2014-05-27 NOTE — Discharge Instructions (Signed)

## 2014-05-27 NOTE — ED Notes (Signed)
Pt ambulatory to restroom no difficulty.
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
## 2014-05-28 ENCOUNTER — Encounter: Payer: Self-pay | Admitting: Internal Medicine

## 2014-05-28 ENCOUNTER — Other Ambulatory Visit: Payer: Self-pay | Admitting: Internal Medicine

## 2014-06-02 ENCOUNTER — Other Ambulatory Visit (HOSPITAL_BASED_OUTPATIENT_CLINIC_OR_DEPARTMENT_OTHER): Payer: BLUE CROSS/BLUE SHIELD

## 2014-06-02 ENCOUNTER — Telehealth: Payer: Self-pay | Admitting: *Deleted

## 2014-06-02 NOTE — Telephone Encounter (Signed)
Patient into [facility] for labs.  She left a note on a Walk-In Form that said "Dr. Delfin Edis checked labs and said I should start phlebotomies again."  Please advise patient.

## 2014-06-03 ENCOUNTER — Telehealth: Payer: Self-pay | Admitting: *Deleted

## 2014-06-03 LAB — FERRITIN CHCC: Ferritin: 48 ng/ml (ref 9–269)

## 2014-06-03 NOTE — Telephone Encounter (Signed)
Called pt with ferritin results: Stable, per Dr. Benay Spice. Follow up as scheduled. Pt stated Dr. Olevia Perches wants her to resume phlebotomy.  Informed pt Dr. Benay Spice reviewed labs from Dr. Nichola Sizer office. Plan is to recheck labs in Sept. Pt voiced understanding.

## 2014-06-03 NOTE — Telephone Encounter (Signed)
-----   Message from Ladell Pier, MD sent at 06/03/2014 10:30 AM EDT ----- Please call patient, ferritin is stable, f/u as scheduled

## 2014-06-04 ENCOUNTER — Encounter: Payer: BLUE CROSS/BLUE SHIELD | Admitting: Internal Medicine

## 2014-07-20 ENCOUNTER — Encounter: Payer: Self-pay | Admitting: Internal Medicine

## 2014-07-21 ENCOUNTER — Encounter: Payer: Self-pay | Admitting: *Deleted

## 2014-07-21 ENCOUNTER — Telehealth: Payer: Self-pay | Admitting: *Deleted

## 2014-07-21 NOTE — Telephone Encounter (Signed)
Patient had sent an email in stating that they lost their instructions for their colonoscopy. The patient will have their colonoscopy tomorrow (07/22/14) at 7:30 am. Martin Majestic over the instructions with the patient on how to prep their Moviprep. Patient did say she had a salad yesterday (07/20/14). I offered for the patient to pickup new instructions, but patient declined. Told the patient if they have any further questions or concerns, to please call before 5 pm today. Patient stated they understood.

## 2014-07-22 ENCOUNTER — Encounter: Payer: Self-pay | Admitting: Internal Medicine

## 2014-07-22 ENCOUNTER — Ambulatory Visit (AMBULATORY_SURGERY_CENTER): Payer: BLUE CROSS/BLUE SHIELD | Admitting: Internal Medicine

## 2014-07-22 VITALS — BP 117/77 | HR 80 | Temp 98.6°F | Resp 27 | Ht 63.0 in | Wt 184.0 lb

## 2014-07-22 DIAGNOSIS — K5732 Diverticulitis of large intestine without perforation or abscess without bleeding: Secondary | ICD-10-CM

## 2014-07-22 DIAGNOSIS — R194 Change in bowel habit: Secondary | ICD-10-CM | POA: Diagnosis present

## 2014-07-22 MED ORDER — SODIUM CHLORIDE 0.9 % IV SOLN: 500.0000 mL | INTRAVENOUS | Status: DC

## 2014-07-22 NOTE — Progress Notes (Signed)
A/ox3, pleased with MAC, report to RN 

## 2014-07-22 NOTE — Op Note (Signed)
[facility] [place]  [address]. [place], [address]   COLONOSCOPY PROCEDURE REPORT  PATIENT: Molly Quinn, Molly Quinn  MR#: [id number] BIRTHDATE: 1975-06-06 , 39  yrs. old GENDER: female ENDOSCOPIST: Lafayette Dragon, MD REFERRED XH:FSFS Avel Sensor, M.D. PROCEDURE DATE:  07/22/2014 PROCEDURE:   Colonoscopy, diagnostic First Screening Colonoscopy - Avg.  risk and is 50 yrs.  old or older - No.  Prior Negative Screening - Now for repeat screening. 10 or more years since last screening  History of Adenoma - Now for follow-up colonoscopy & has been > or = to 3 yrs.  N/A ASA CLASS:   Class II INDICATIONS:Screening for colonic neoplasia and history of homozygous hemachromatosis, + 282Y,  Mother and brother with ulcerative colitis.  Prior colon[place] in 2003 [place] state.was normal MEDICATIONS: Monitored anesthesia care and Propofol 350 mg IV  DESCRIPTION OF PROCEDURE:   After the risks benefits and alternatives of the procedure were thoroughly explained, informed consent was obtained.  The digital rectal exam revealed no abnormalities of the rectum.   The LB [id number]  endoscope was introduced through the anus and advanced to the cecum, which was identified by both the appendix and ileocecal valve. No adverse events experienced.   The quality of the prep was excellent. (MoviPrep was used)  The instrument was then slowly withdrawn as the colon was fully examined.      COLON FINDINGS: A normal appearing cecum, ileocecal valve, and appendiceal orifice were identified.  The ascending, transverse, descending, sigmoid colon, and rectum appeared unremarkable. Retroflexed views revealed no abnormalities. The time to cecum = 4.57 Withdrawal time = 8.24   The scope was withdrawn and the procedure completed. COMPLICATIONS: There were no immediate complications.  ENDOSCOPIC IMPRESSION: Normal colonoscopy  RECOMMENDATIONS: High-fiber diet Fiber supplements Recall  colonoscopy at age 62  eSigned:  Lafayette Dragon, MD 07/22/2014 8:01 AM   cc:

## 2014-07-22 NOTE — Patient Instructions (Signed)
Discharge instructions given. Normal exam. Resume previous medications. YOU HAD AN ENDOSCOPIC PROCEDURE TODAY AT THE [facility]:   Refer to the procedure report that was given to you for any specific questions about what was found during the examination.  If the procedure report does not answer your questions, please call your gastroenterologist to clarify.  If you requested that your care partner not be given the details of your procedure findings, then the procedure report has been included in a sealed envelope for you to review at your convenience later.  YOU SHOULD EXPECT: Some feelings of bloating in the abdomen. Passage of more gas than usual.  Walking can help get rid of the air that was put into your GI tract during the procedure and reduce the bloating. If you had a lower endoscopy (such as a colonoscopy or flexible sigmoidoscopy) you may notice spotting of blood in your stool or on the toilet paper. If you underwent a bowel prep for your procedure, you may not have a normal bowel movement for a few days.  Please Note:  You might notice some irritation and congestion in your nose or some drainage.  This is from the oxygen used during your procedure.  There is no need for concern and it should clear up in a day or so.  SYMPTOMS TO REPORT IMMEDIATELY:   Following lower endoscopy (colonoscopy or flexible sigmoidoscopy):  Excessive amounts of blood in the stool  Significant tenderness or worsening of abdominal pains  Swelling of the abdomen that is new, acute  Fever of 100F or higher   For urgent or emergent issues, a gastroenterologist can be reached at any hour by calling [phone].   DIET: Your first meal following the procedure should be a small meal and then it is ok to progress to your normal diet. Heavy or fried foods are harder to digest and may make you feel nauseous or bloated.  Likewise, meals heavy in dairy and vegetables can increase bloating.  Drink plenty  of fluids but you should avoid alcoholic beverages for 24 hours.  ACTIVITY:  You should plan to take it easy for the rest of today and you should NOT DRIVE or use heavy machinery until tomorrow (because of the sedation medicines used during the test).    FOLLOW UP: Our staff will call the number listed on your records the next business day following your procedure to check on you and address any questions or concerns that you may have regarding the information given to you following your procedure. If we do not reach you, we will leave a message.  However, if you are feeling well and you are not experiencing any problems, there is no need to return our call.  We will assume that you have returned to your regular daily activities without incident.  If any biopsies were taken you will be contacted by phone or by letter within the next 1-3 weeks.  Please call us at [phone] if you have not heard about the biopsies in 3 weeks.    SIGNATURES/CONFIDENTIALITY: You and/or your care partner have signed paperwork which will be entered into your electronic medical record.  These signatures attest to the fact that that the information above on your After Visit Summary has been reviewed and is understood.  Full responsibility of the confidentiality of this discharge information lies with you and/or your care-partner. 

## 2014-07-23 ENCOUNTER — Telehealth: Payer: Self-pay | Admitting: *Deleted

## 2014-07-23 NOTE — Telephone Encounter (Signed)
  Follow up Call-  Call back number 07/22/2014  Post procedure Call Back phone  # [phone]  Permission to leave phone message Yes     Patient questions:  Do you have a fever, pain , or abdominal swelling? No. Pain Score  0 *  Have you tolerated food without any problems? Yes.    Have you been able to return to your normal activities? Yes.    Do you have any questions about your discharge instructions: Diet   No. Medications  No. Follow up visit  No.  Do you have questions or concerns about your Care? No.  Actions: * If pain score is 4 or above: No action needed, pain <4.

## 2014-08-04 ENCOUNTER — Encounter: Payer: Self-pay | Admitting: Internal Medicine

## 2014-09-03 ENCOUNTER — Encounter: Payer: Self-pay | Admitting: Internal Medicine

## 2014-09-03 ENCOUNTER — Ambulatory Visit (INDEPENDENT_AMBULATORY_CARE_PROVIDER_SITE_OTHER): Payer: BLUE CROSS/BLUE SHIELD | Admitting: Internal Medicine

## 2014-09-03 VITALS — BP 120/84 | HR 71 | Temp 98.0°F | Wt 186.0 lb

## 2014-09-03 DIAGNOSIS — M79641 Pain in right hand: Secondary | ICD-10-CM

## 2014-09-03 DIAGNOSIS — M542 Cervicalgia: Secondary | ICD-10-CM

## 2014-09-03 DIAGNOSIS — F410 Panic disorder [episodic paroxysmal anxiety] without agoraphobia: Secondary | ICD-10-CM | POA: Diagnosis not present

## 2014-09-03 MED ORDER — IBUPROFEN 600 MG PO TABS: 600.0000 mg | ORAL_TABLET | Freq: Three times a day (TID) | ORAL | Status: DC | PRN

## 2014-09-03 MED ORDER — ALPRAZOLAM 0.5 MG PO TABS: 0.5000 mg | ORAL_TABLET | Freq: Two times a day (BID) | ORAL | Status: DC | PRN

## 2014-09-03 MED ORDER — AMOXICILLIN-POT CLAVULANATE 875-125 MG PO TABS
1.0000 | ORAL_TABLET | Freq: Two times a day (BID) | ORAL | Status: DC
Start: 2014-09-03 — End: 2014-09-17

## 2014-09-03 MED ORDER — ALPRAZOLAM 0.5 MG PO TABS: 1.0000 mg | ORAL_TABLET | Freq: Two times a day (BID) | ORAL | Status: DC | PRN

## 2014-09-03 NOTE — Progress Notes (Signed)
   Subjective:    Patient ID: Molly Quinn, female    DOB: 1975-05-10, 39 y.o.   MRN: [id number]   C/o neck pain on R side - chronic  C/o anxiety with flying  Hand Pain  The incident occurred 3 to 5 days ago. There was no injury mechanism. The quality of the pain is described as burning. The pain is moderate. Pertinent negatives include no numbness. She has tried nothing for the symptoms.   Wt Readings from Last 3 Encounters:  09/03/14 186 lb (84.369 kg)  07/22/14 184 lb (83.462 kg)  05/27/14 183 lb (83.008 kg)   BP Readings from Last 3 Encounters:  09/03/14 120/84  07/22/14 117/77  05/27/14 132/84       Review of Systems  Constitutional: Negative for chills, activity change, appetite change, fatigue and unexpected weight change.  HENT: Negative for mouth sores and sinus pressure.   Eyes: Negative for visual disturbance.  Respiratory: Negative for chest tightness.   Genitourinary: Negative for difficulty urinating and vaginal pain.  Musculoskeletal: Negative for back pain and gait problem.  Skin: Negative for pallor.  Neurological: Negative for dizziness, tremors, weakness and numbness.  Psychiatric/Behavioral: Negative for confusion and sleep disturbance.       Objective:   Physical Exam  Constitutional: She appears well-developed. No distress.  HENT:  Head: Normocephalic.  Right Ear: External ear normal.  Left Ear: External ear normal.  Nose: Nose normal.  Mouth/Throat: Oropharynx is clear and moist. No oropharyngeal exudate.  eryth throat  Eyes: Conjunctivae are normal. Pupils are equal, round, and reactive to light. Right eye exhibits no discharge. Left eye exhibits no discharge.  Neck: Normal range of motion. Neck supple. No JVD present. No tracheal deviation present. No thyromegaly present.  Cardiovascular: Normal rate, regular rhythm and normal heart sounds.   Pulmonary/Chest: No stridor. No respiratory distress. She has no wheezes.  Abdominal: Soft. Bowel  sounds are normal. She exhibits no distension and no mass. There is no tenderness. There is no rebound and no guarding.  Musculoskeletal: She exhibits no edema or tenderness.  Lymphadenopathy:    She has no cervical adenopathy.  Neurological: She displays normal reflexes. No cranial nerve deficit. She exhibits normal muscle tone. Coordination normal.  Skin: No rash noted. No erythema.  Psychiatric: She has a normal mood and affect. Her behavior is normal. Judgment and thought content normal.  Swelling over R 2nd MCP on palm - blueish, warm Neck is tender over R paraspinal muscles  04/09/14 abd CT IMPRESSION: 1. Mild left pelvic inflammatory changes, likely indicating mild diverticulitis or appendagitis epiploica. No evidence of bowel perforation or abscess. 2. The appendix appears normal. 3. Uterine fibroids.   Electronically Signed  By: Camie Patience M.D.  On: 04/09/2014 21:33        Assessment & Plan:

## 2014-09-03 NOTE — Assessment & Plan Note (Signed)
Fear of Flying Xanax prn

## 2014-09-03 NOTE — Progress Notes (Signed)
Pre visit review using our clinic review tool, if applicable. No additional management support is needed unless otherwise documented below in the visit note. 

## 2014-09-03 NOTE — Assessment & Plan Note (Signed)
6/16 chronic R, MSK  Ibuprofen prn Move screen to L or in the center Start PT

## 2014-09-03 NOTE — Assessment & Plan Note (Signed)
6/16 - localized cellulitis vs insect bite vs other Rx: Augmentin, Ibuprofen

## 2014-09-07 ENCOUNTER — Telehealth: Payer: Self-pay | Admitting: Internal Medicine

## 2014-09-07 NOTE — Telephone Encounter (Signed)
Patient stated that she is not feeling any bette, do she nee to come in to see the doctor, please advises

## 2014-09-07 NOTE — Telephone Encounter (Signed)
Yes pls Thx 

## 2014-09-08 ENCOUNTER — Encounter: Payer: Self-pay | Admitting: Internal Medicine

## 2014-09-10 ENCOUNTER — Other Ambulatory Visit: Payer: Self-pay | Admitting: Internal Medicine

## 2014-09-10 DIAGNOSIS — M79643 Pain in unspecified hand: Secondary | ICD-10-CM

## 2014-09-10 DIAGNOSIS — M542 Cervicalgia: Secondary | ICD-10-CM

## 2014-09-17 ENCOUNTER — Ambulatory Visit (INDEPENDENT_AMBULATORY_CARE_PROVIDER_SITE_OTHER)
Admission: RE | Admit: 2014-09-17 | Discharge: 2014-09-17 | Disposition: A | Discharge status: Home / Self Care | Payer: BLUE CROSS/BLUE SHIELD | Source: Ambulatory Visit | Attending: Family Medicine | Admitting: Family Medicine

## 2014-09-17 ENCOUNTER — Ambulatory Visit (INDEPENDENT_AMBULATORY_CARE_PROVIDER_SITE_OTHER): Payer: BLUE CROSS/BLUE SHIELD | Admitting: Family Medicine

## 2014-09-17 ENCOUNTER — Other Ambulatory Visit (INDEPENDENT_AMBULATORY_CARE_PROVIDER_SITE_OTHER): Payer: BLUE CROSS/BLUE SHIELD

## 2014-09-17 ENCOUNTER — Encounter: Payer: Self-pay | Admitting: Family Medicine

## 2014-09-17 VITALS — BP 102/74 | HR 71 | Ht 63.25 in | Wt 185.0 lb

## 2014-09-17 DIAGNOSIS — M9908 Segmental and somatic dysfunction of rib cage: Secondary | ICD-10-CM

## 2014-09-17 DIAGNOSIS — M79641 Pain in right hand: Secondary | ICD-10-CM

## 2014-09-17 DIAGNOSIS — M542 Cervicalgia: Secondary | ICD-10-CM | POA: Diagnosis not present

## 2014-09-17 DIAGNOSIS — M9901 Segmental and somatic dysfunction of cervical region: Secondary | ICD-10-CM

## 2014-09-17 DIAGNOSIS — M999 Biomechanical lesion, unspecified: Secondary | ICD-10-CM | POA: Insufficient documentation

## 2014-09-17 DIAGNOSIS — M9902 Segmental and somatic dysfunction of thoracic region: Secondary | ICD-10-CM

## 2014-09-17 NOTE — Assessment & Plan Note (Signed)
Patient is a pain seems to be multifactorial with more muscles as well as anxiety. We discussed postural changes throughout the day as well as self manual massage techniques. Patient can do massage with a masseuse as well. We discussed icing regimen. Patient will try topical anti-inflammatory. Patient will come back and see me again in 3 weeks if she feels that the osteopathic manipulation was beneficial.

## 2014-09-17 NOTE — Progress Notes (Signed)
Pre visit review using our clinic review tool, if applicable. No additional management support is needed unless otherwise documented below in the visit note. 

## 2014-09-17 NOTE — Assessment & Plan Note (Signed)
Decision today to treat with OMT was based on Physical Exam  After verbal consent patient was treated with HVLA, mE techniques in Circle, thoracic, rib areas  Patient tolerated the procedure well with improvement in symptoms  Patient given exercises, stretches and lifestyle modifications  See medications in patient instructions if given  Patient will follow up in 2-3 weeks

## 2014-09-17 NOTE — Assessment & Plan Note (Signed)
This is underneath the flexor tendon on the index finger. ? Fracture. Question will also mass possibly splinter. Patient does have hypoechoic changes and increasing Doppler flow in the area but I would not say that it is a true infectious etiology at this time. Patient has any redness we will rescan patient's in about again. Because it does seem to be deep to the tendon at this time patient will be sent to a hand specialist for further evaluation. X-rays ordered today to rule out any bony abnormality. Patient will call with any questions based on this.

## 2014-09-17 NOTE — Patient Instructions (Signed)
Good to see you.  Ice 20 minutes 2 times daily. Usually after activity and before bed. Exercises 3 times a week.  pennsaid pinkie amount topically 2 times daily as needed.  Weight lifting instead of stretching.  Get xrays downstairs today We will refer you to hand specialist, they will call you to schedule   See me again in 3 weeks for neck   STRENGTH - Scapular Retractors  Secure a rubber exercise band/tubing so that it is at the height of your shoulders when you are either standing or sitting on a firm arm-less chair.  With a palm-down grip, grasp an end of the band/tubing in each hand. Straighten your elbows and lift your hands straight in front of you at shoulder height. Step back away from the secured end of band/tubing until it becomes tense.  Squeezing your shoulder blades together, draw your elbows back as you bend them. Keep your upper arm lifted away from your body throughout the exercise.  Hold __________ seconds. Slowly ease the tension on the band/tubing as you reverse the directions and return to the starting position. Repeat __________ times. Complete this exercise __________ times per day. STRENGTH - Shoulder Extensors   Secure a rubber exercise band/tubing so that it is at the height of your shoulders when you are either standing or sitting on a firm arm-less chair.  With a thumbs-up grip, grasp an end of the band/tubing in each hand. Straighten your elbows and lift your hands straight in front of you at shoulder height. Step back away from the secured end of band/tubing until it becomes tense.  Squeezing your shoulder blades together, pull your hands down to the sides of your thighs. Do not allow your hands to go behind you.  Hold for __________ seconds. Slowly ease the tension on the band/tubing as you reverse the directions and return to the starting position. Repeat __________ times. Complete this exercise __________ times per day.  STRENGTH - Scapular Retractors  and External Rotators  Secure a rubber exercise band/tubing so that it is at the height of your shoulders when you are either standing or sitting on a firm arm-less chair.  With a palm-down grip, grasp an end of the band/tubing in each hand. Bend your elbows 90 degrees and lift your elbows to shoulder height at your sides. Step back away from the secured end of band/tubing until it becomes tense.  Squeezing your shoulder blades together, rotate your shoulder so that your upper arm and elbow remain stationary, but your fists travel upward to head-height.  Hold __________ for seconds. Slowly ease the tension on the band/tubing as you reverse the directions and return to the starting position. Repeat __________ times. Complete this exercise __________ times per day.  STRENGTH - Scapular Retractors and Elevators  Secure a rubber exercise band/tubing so that it is at the height of your shoulders when you are either standing or sitting on a firm arm-less chair.  With a thumbs-up grip, grasp an end of the band/tubing in each hand. Step back away from the secured end of band/tubing until it becomes tense.  Squeezing your shoulder blades together, straighten your elbows and lift your hands straight over your head.  Hold for __________ seconds. Slowly ease the tension on the band/tubing as you reverse the directions and return to the starting position. Repeat __________ times. Complete this exercise __________ times per day.  Document Released: 02/27/2005 Document Revised: 05/22/2011 Document Reviewed: 06/11/2008 [facility] Patient Information 2015 [place], [place]. This information  is not intended to replace advice given to you by your health care provider. Make sure you discuss any questions you have with your health care provider.  

## 2014-09-17 NOTE — Progress Notes (Signed)
[facility] [address] [place], [place] [address] Phone: [phone] Subjective:    I'm seeing this patient by the request  of:  Molly Kehr, MD   CC: Right hand pain, neck pain  JSE:[id number] Molly Quinn is a 39 y.o. female coming in with complaint of right hand pain. Patient was seen by primary care provider 2 weeks ago. Patient at that time did have some increasing redness of the hand. There is a questionable cellulitis versus inside bite area and patient was put on Augmentin as well as given ibuprofen. Patient states that the pain has improved somewhat but unfortunately the mass on the palmar aspect of the first finger seems to be enlarging. Patient states still seems swollen compared to the contralateral side as well. Patient denies any fevers or chills or any abnormal weight loss. Patient states that she can do most of her daily activities. Patient does sit at a computer on a regular basis and does typing. Patient has never injured this hand previously. No known injury  Past Medical History  Diagnosis Date  . Epilepsy dx 1991    no breakthru while on meds, last in 1994  . Hemochromatosis dx 03/2005 in [place]    phlebotomy prn - 1x/yr or less, cbc q15mo  . Hyperlipidemia   . IBS (irritable bowel syndrome)   . Anxiety    Past Surgical History  Procedure Laterality Date  . Lymph node removal    . Repair ankle ligament    . Cesarean section  11/08/2010    Procedure: CESAREAN SECTION;  Surgeon: Daria Pastures;  Location: [facility];  Service: Gynecology;  Laterality: N/A;  . Bunionectomy     Family History  Problem Relation Age of Onset  . Ulcerative colitis Mother   . Hypertension Mother   . Hyperlipidemia Mother   . Ulcerative colitis Brother     coloectomy age 42  . Diabetes Maternal Grandmother   . Heart disease Maternal Grandmother   . Hypertension Father   . Hyperlipidemia Father   . Melanoma Father 56  . Hypertension Brother   .  Breast cancer Paternal Grandmother   . Colon cancer Neg Hx   . Heart disease Maternal Uncle   . Prostate cancer Maternal Uncle   . Colon polyps Maternal Uncle   . Hemochromatosis Brother   . Other Brother     [place]  . Other Brother     bile duct cancer  . Ovarian cancer Paternal Aunt   . Cancer Paternal Aunt     ov[place] ca   History  Substance Use Topics  . Smoking status: Never Smoker   . Smokeless tobacco: Never Used  . Alcohol Use: Yes     Comment: occasional   Allergies  Allergen Reactions  . Phenytoin Rash and Other (See Comments)    Reaction: pt passes out  . Sulfa Antibiotics Rash     Patient also complains of neck pain. Seems to be right-sided. Describes it as a dull throbbing aching sensation with sometimes radiation down the right arm. This is feeling far between. Patient though does do a lot of sitting at work and states that after a long day she can have more discomfort. Patient denies any nighttime awakening. Rates the severity of pain a 5 out of 10. Patient had x-rays ordered today and these were reviewed by me and did not show any bony significant abnormality. Soft tissue swelling noted is noted.  Past medical history, social, surgical and family history all reviewed in electronic medical record.   Review of Systems: No headache, visual changes, nausea, vomiting, diarrhea, constipation, dizziness, abdominal pain, skin rash, fevers, chills, night sweats, weight loss, swollen lymph nodes, body aches, joint swelling, muscle aches, chest pain, shortness of breath, mood changes.   Objective Blood pressure 102/74, pulse 71, height 5' 3.25" (1.607 m), weight 185 lb (83.915 kg), SpO2 98 %.  General: No apparent distress alert and oriented x3 mood and affect normal, dressed appropriately.  HEENT: Pupils equal, extraocular movements intact  Respiratory: Patient's speak in full sentences and does not appear short of breath  Cardiovascular: No lower extremity edema, non  tender, no erythema  Skin: Warm dry intact with no signs of infection or rash on extremities or on axial skeleton.  Abdomen: Soft nontender  Neuro: Cranial nerves II through XII are intact, neurovascularly intact in all extremities with 2+ DTRs and 2+ pulses.  Lymph: No lymphadenopathy of posterior or anterior cervical chain or axillae bilaterally.  Gait normal with good balance and coordination.  MSK:  Non tender with full range of motion and good stability and symmetric strength and tone of shoulders, elbows, hip, knee and ankles bilaterally.  Wrist: Right Inspection normal with no visible erythema or swelling. ROM smooth and normal with good flexion and extension and ulnar/radial deviation that is symmetrical with opposite wrist. Patient on palmar aspect of the first finger near the A2 pulley does have a nodule noted. This does not seem to move with the tendon sheath. Seems to be distal to the area or more inferior. No bony abnormality noted. Minimally tender to palpation. No snuffbox tenderness. No tenderness over Canal of Gu[place]. Strength 5/5 in all directions without pain. Negative Finkelstein, tinel's and phalens. Negative Watson's test. Contralateral wrist unremarkable  Neck: Inspection unremarkable. No palpable stepoffs. Negative Spurling's maneuver. Full neck range of motion Grip strength and sensation normal in bilateral hands Strength good C4 to T1 distribution No sensory change to C4 to T1 Negative Hoffman sign bilaterally Reflexes normal  MSK US performed of: right hand Ultrasound was ordered, performed, and interpreted by Charlann Boxer D.O.  Wrist: All extensor compartments visualized and tendons all normal in appearance without fraying, tears, or sheath effusions. No effusion seen. TFCC intact. Scapholunate ligament intact. Carpal tunnel visualized and median nerve area normal, flexor tendons all normal in appearance without fraying, tears, or sheath  effusions. Patient though over the MCP joint of the second finger shows what appears to be a calcific change versus possible foreign body and surrounding hypoechoic changes with increasing Doppler flow. Power doppler signal normal.  IMPRESSION:  Question foreign body versus fracture over 2nd MCP.  Osteopathic findings  cervical C2 flexed rotated and side bent right C7 flexed rotated and side bent right T1 extended rotated and side bent left with elevated first rib    Impression and Recommendations:     This case required medical decision making of moderate complexity.

## 2014-10-12 ENCOUNTER — Ambulatory Visit: Payer: BLUE CROSS/BLUE SHIELD | Admitting: Family Medicine

## 2014-12-03 ENCOUNTER — Other Ambulatory Visit (HOSPITAL_BASED_OUTPATIENT_CLINIC_OR_DEPARTMENT_OTHER): Payer: BLUE CROSS/BLUE SHIELD

## 2014-12-03 ENCOUNTER — Ambulatory Visit (HOSPITAL_BASED_OUTPATIENT_CLINIC_OR_DEPARTMENT_OTHER): Payer: BLUE CROSS/BLUE SHIELD | Admitting: Oncology

## 2014-12-03 ENCOUNTER — Telehealth: Payer: Self-pay | Admitting: Oncology

## 2014-12-03 NOTE — Telephone Encounter (Signed)
Pt confirmed labs per 09/22 POF, gave pt AVS and Calendar... KJ

## 2014-12-03 NOTE — Progress Notes (Signed)
  [facility] OFFICE PROGRESS NOTE   Diagnosis: Hemochromatosis  INTERVAL HISTORY:   Ms. Donati returns as scheduled. She continues to have a menstrual cycle. No seizures. She had a normal colonoscopy in May of this year.  Objective:  Vital signs in last 24 hours:  Blood pressure 120/77, pulse 90, temperature 98.6 F (37 C), temperature source Oral, resp. rate 18, height 5' 3.25" (1.607 m), weight 188 lb 11.2 oz (85.594 kg), SpO2 99 %.   Resp: Lungs clear Cardio: Regular rate and rhythm GI: No hepatosplenomegaly, no apparent ascites Vascular: No leg edema   Lab Results:  Lab Results  Component Value Date   WBC 9.0 05/22/2014   HGB 14.4 05/22/2014   HCT 42.0 05/22/2014   MCV 90.6 05/22/2014   PLT 297.0 05/22/2014   NEUTROABS 6.1 05/22/2014    06/02/2014: Ferritin-48   Medications: I have reviewed the patient's current medications.  Assessment/Plan: 1. Hereditary hemochromatosis: She last underwent phlebotomy therapy in July 2011.  2. Seizure disorder: Maintained on Lamictal. 3. History of an abnormal liver ultrasound: Followed with repeat imaging studies by Dr. Olevia Perches, negative abdomen CT 04/09/2014 4. Status post cesarean section delivery of a female baby in August 2012.     Disposition:  She appears stable. We will follow-up on the ferritin level from today. The plan is to initiate phlebotomy therapy for a ferritin of greater than 50. Ms. Scharfenberg will return for a lab visit in 6 months and an office visit in one year.  Betsy Coder, MD  12/03/2014  3:20 PM

## 2014-12-04 ENCOUNTER — Telehealth: Payer: Self-pay | Admitting: Oncology

## 2014-12-04 ENCOUNTER — Other Ambulatory Visit: Payer: Self-pay | Admitting: *Deleted

## 2014-12-04 ENCOUNTER — Telehealth: Payer: Self-pay | Admitting: *Deleted

## 2014-12-04 LAB — FERRITIN CHCC: FERRITIN: 62 ng/mL (ref 9–269)

## 2014-12-04 NOTE — Telephone Encounter (Signed)
-----   Message from Ladell Pier, MD sent at 12/04/2014  9:45 AM EDT ----- Please schedule phlebotomy every 2 weeks for 2 treatments, give 500 cc NS with each phlebotomy

## 2014-12-04 NOTE — Telephone Encounter (Signed)
Per Dr. Benay Spice; notified pt that ferritin is 62 and schedulers will call with date/time for phlebotomy x2 (1 per week) with IVF prior.  Pt verbalized understanding and will wait to hear from schedulers.

## 2014-12-04 NOTE — Telephone Encounter (Signed)
Scheduled pt labs/ov and fluids/phlebotomy per 09/23 POF, sent msg to chemo to verify correct and pt will be called confirming D/T... Mailed out schedule for next year... KJ

## 2014-12-11 ENCOUNTER — Telehealth: Payer: Self-pay | Admitting: *Deleted

## 2014-12-11 ENCOUNTER — Ambulatory Visit (HOSPITAL_BASED_OUTPATIENT_CLINIC_OR_DEPARTMENT_OTHER): Payer: BLUE CROSS/BLUE SHIELD

## 2014-12-11 MED ORDER — SODIUM CHLORIDE 0.9 % IV SOLN
Freq: Once | INTRAVENOUS | Status: AC
  Administered 2014-12-11: 14:00:00 via INTRAVENOUS

## 2014-12-11 NOTE — Telephone Encounter (Signed)
Per patient request I have moved 10/7 appt to later in the day.

## 2014-12-11 NOTE — Progress Notes (Signed)
Phlebotomy to right AC performed using 18G angiocath and secondary set, no difficulty from 1415-1425.

## 2014-12-11 NOTE — Patient Instructions (Signed)

## 2014-12-18 ENCOUNTER — Other Ambulatory Visit: Payer: Self-pay | Admitting: *Deleted

## 2014-12-18 ENCOUNTER — Ambulatory Visit (HOSPITAL_BASED_OUTPATIENT_CLINIC_OR_DEPARTMENT_OTHER): Payer: BLUE CROSS/BLUE SHIELD

## 2014-12-18 MED ORDER — SODIUM CHLORIDE 0.9 % IV SOLN
500.0000 mL | Freq: Once | INTRAVENOUS | Status: AC
  Administered 2014-12-18: 500 mL via INTRAVENOUS

## 2014-12-18 NOTE — Progress Notes (Signed)
Therapeutic phlebotomy done for 507 gms via #18 g angoicath in left ACF. From 1405 to 1420. Tolerated well except for slight dizzyness. Received 500cc NS after phebotomy.  Pt was given beverage and cheese and crackers after phlebotomy.  Denies nauesa, lightedness etc.  VSS after procedure.

## 2014-12-18 NOTE — Patient Instructions (Signed)

## 2015-04-14 HISTORY — PX: TRANSTHORACIC ECHOCARDIOGRAM: SHX275

## 2015-05-21 ENCOUNTER — Other Ambulatory Visit: Payer: Self-pay | Admitting: Obstetrics and Gynecology

## 2015-05-21 DIAGNOSIS — R928 Other abnormal and inconclusive findings on diagnostic imaging of breast: Secondary | ICD-10-CM

## 2015-05-27 ENCOUNTER — Other Ambulatory Visit: Payer: Self-pay | Admitting: Obstetrics and Gynecology

## 2015-05-27 ENCOUNTER — Ambulatory Visit
Admission: RE | Admit: 2015-05-27 | Discharge: 2015-05-27 | Disposition: A | Discharge status: Home / Self Care | Payer: BLUE CROSS/BLUE SHIELD | Source: Ambulatory Visit | Attending: Obstetrics and Gynecology | Admitting: Obstetrics and Gynecology

## 2015-05-27 ENCOUNTER — Ambulatory Visit
Admission: RE | Admit: 2015-05-27 | Discharge: 2015-05-27 | Disposition: A | Discharge status: Home / Self Care | Payer: No Typology Code available for payment source | Source: Ambulatory Visit | Attending: Obstetrics and Gynecology | Admitting: Obstetrics and Gynecology

## 2015-05-27 DIAGNOSIS — R928 Other abnormal and inconclusive findings on diagnostic imaging of breast: Secondary | ICD-10-CM

## 2015-05-31 ENCOUNTER — Other Ambulatory Visit: Payer: BLUE CROSS/BLUE SHIELD

## 2015-06-02 ENCOUNTER — Other Ambulatory Visit (HOSPITAL_BASED_OUTPATIENT_CLINIC_OR_DEPARTMENT_OTHER): Payer: BLUE CROSS/BLUE SHIELD

## 2015-06-03 ENCOUNTER — Telehealth: Payer: Self-pay | Admitting: *Deleted

## 2015-06-03 LAB — FERRITIN: FERRITIN: 30 ng/mL (ref 9–269)

## 2015-06-03 NOTE — Telephone Encounter (Signed)
-----   Message from Ladell Pier, MD sent at 06/03/2015 12:05 PM EDT ----- Please call patient, Molly Quinn looks good, f/u as scheduled

## 2015-06-10 ENCOUNTER — Ambulatory Visit (INDEPENDENT_AMBULATORY_CARE_PROVIDER_SITE_OTHER): Payer: BLUE CROSS/BLUE SHIELD | Admitting: Family Medicine

## 2015-06-10 ENCOUNTER — Telehealth: Payer: Self-pay | Admitting: Internal Medicine

## 2015-06-10 ENCOUNTER — Encounter: Payer: Self-pay | Admitting: Family Medicine

## 2015-06-10 VITALS — BP 120/82 | HR 76 | Temp 97.7°F | Ht 63.25 in | Wt 201.6 lb

## 2015-06-10 DIAGNOSIS — M545 Low back pain, unspecified: Secondary | ICD-10-CM

## 2015-06-10 MED ORDER — CYCLOBENZAPRINE HCL 5 MG PO TABS: 5.0000 mg | ORAL_TABLET | Freq: Every day | ORAL | Status: DC

## 2015-06-10 NOTE — Telephone Encounter (Signed)
[facility]  Patient Name: Molly Quinn  DOB: 01/08/1976    Initial Comment Caller states she is having back pain-- wanted to see if another location could see her today- can only be schedule between 12-4   Nurse Assessment  Nurse: Harlow Mares, RN, Suanne Marker Date/Time (Eastern Time): 06/10/2015 9:04:27 AM  Confirm and document reason for call. If symptomatic, describe symptoms. You must click the next button to save text entered. ---Caller states she is having back pain-- wanted to see if another location could see her today- can only be schedule between 12-4. Reports second day of back pain. She has been seen for scoleosis in the past, this is lower back pain. No reported injury. She reports that she has slept on the couch and awoke with back pain.  Has the patient traveled out of the country within the last 30 days? ---No  Does the patient have any new or worsening symptoms? ---Yes  Will a triage be completed? ---Yes  Related visit to physician within the last 2 weeks? ---No  Does the PT have any chronic conditions? (i.e. diabetes, asthma, etc.) ---Yes  List chronic conditions. ---scoleosis, hemachromatosis, epilespy  Is the patient pregnant or possibly pregnant? (Ask all females between the ages of 93-55) ---No  Is this a behavioral health or substance abuse call? ---No     Guidelines    Guideline Title Affirmed Question Affirmed Notes  Back Pain [1] SEVERE back pain (e.g., excruciating, unable to do any normal activities) AND [2] not improved 2 hours after pain medicine    Final Disposition User   See Physician within 4 Hours (or PCP triage) Harlow Mares, RN, Rhonda    Comments  Caller reports that she has an appt with Dr. Terri Piedra at the [facility] office tomorrow at 8:30am, but would like to be seen today. Advised that there are no available appts at the [facility] office today but was able to schedule caller with appt with Dr. Colin Benton at the [facility]  location at 2pm today at 06/10/15. Caller voiced understanding. Called the [place] office and cancelled the 8:30 am appt with Dr. Elna Breslow for the tomorrow.   Referrals  REFERRED TO PCP OFFICE   Disagree/Comply: Comply

## 2015-06-10 NOTE — Patient Instructions (Signed)
Before you leave: -Low back exercises  Heat team minutes twice daily  Flexeril at night as needed per instructions  Can use Tylenol or Aleve as instructed on bottle along with topical sports creams with [place] or menthol for pain as needed.  Do the home physical therapy exercises at least 4 days per week.  Schedule follow-up with your doctor in 3-4 weeks. Of course, please follow up sooner if you are worsening or you have new concerns.

## 2015-06-10 NOTE — Progress Notes (Signed)
Pre visit review using our clinic review tool, if applicable. No additional management support is needed unless otherwise documented below in the visit note. 

## 2015-06-10 NOTE — Progress Notes (Signed)
HPI:  Molly Quinn is a pleasant 40 year old here for an acute visit for back pain. She reports she has relatives visiting, so has been sleeping on the sofa instead of her bed and woke up with back pain yesterday. The pain is localized in the right low back muscles. It is moderate to severe, sharp at times and is worse with certain movements. She denies radiation of pain, weakness or numbness in the lower extremities, fevers, malaise, bowel or bladder incontinence, known trauma or prior low back injury or pain. She does not take Xanax on a regular basis. ROS: See pertinent positives and negatives per HPI.  Past Medical History  Diagnosis Date  . Epilepsy ([place]) dx 1991    no breakthru while on meds, last in 1994  . Hemochromatosis dx 03/2005 in [place]    phlebotomy prn - 1x/yr or less, cbc q80mo  . Hyperlipidemia   . IBS (irritable bowel syndrome)   . Anxiety     Past Surgical History  Procedure Laterality Date  . Lymph node removal    . Repair ankle ligament    . Cesarean section  11/08/2010    Procedure: CESAREAN SECTION;  Surgeon: Daria Pastures;  Location: [facility];  Service: Gynecology;  Laterality: N/A;  . Bunionectomy      Family History  Problem Relation Age of Onset  . Ulcerative colitis Mother   . Hypertension Mother   . Hyperlipidemia Mother   . Ulcerative colitis Brother     coloectomy age 42  . Diabetes Maternal Grandmother   . Heart disease Maternal Grandmother   . Hypertension Father   . Hyperlipidemia Father   . Melanoma Father 81  . Hypertension Brother   . Breast cancer Paternal Grandmother   . Colon cancer Neg Hx   . Heart disease Maternal Uncle   . Prostate cancer Maternal Uncle   . Colon polyps Maternal Uncle   . Hemochromatosis Brother   . Other Brother     [place]  . Other Brother     bile duct cancer  . Ovarian cancer Paternal Aunt   . Cancer Paternal Aunt     ovar ca    Social History   Social History  . Marital Status: Married    Spouse  Name: N/A  . Number of Children: 1  . Years of Education: N/A   Occupational History  . disability attorney    Social History Main Topics  . Smoking status: Never Smoker   . Smokeless tobacco: Never Used  . Alcohol Use: Yes     Comment: occasional  . Drug Use: No  . Sexual Activity: Not Asked   Other Topics Concern  . None   Social History Narrative     Current outpatient prescriptions:  .  ALPRAZolam (XANAX) 0.5 MG tablet, Take 1-2 tablets (0.5-1 mg total) by mouth 2 (two) times daily as needed for anxiety (for flight anxiety)., Disp: 30 tablet, Rfl: 1 .  ergocalciferol (VITAMIN D2) 50000 UNITS capsule, Take 1 capsule (50,000 Units total) by mouth once a week. (Patient taking differently: Take 50,000 Units by mouth daily. ), Disp: 8 capsule, Rfl: 0 .  lamoTRIgine (LAMICTAL) 100 MG tablet, Take 300 mg by mouth at bedtime. , Disp: , Rfl:  .  cyclobenzaprine (FLEXERIL) 5 MG tablet, Take 1 tablet (5 mg total) by mouth at bedtime., Disp: 20 tablet, Rfl: 0  EXAM:  Filed Vitals:   06/10/15 1354  BP: 120/82  Pulse: 76  Temp: 97.7  F (36.5 C)    Body mass index is 35.41 kg/(m^2).  GENERAL: vitals reviewed and listed above, alert, oriented, appears well hydrated and in no acute distress  HEENT: atraumatic, conjunttiva clear, no obvious abnormalities on inspection of external nose and ears  NECK: no obvious masses on inspection  MS: moves all extremities without noticeable abnormality Normal Gait Normal inspection of back, no obvious scoliosis or leg length descrepancy No bony TTP Soft tissue TTP at: R paraspinal muscles -/+ tests: neg trendelenburg,-facet loading, -SLRT, -CLRT, -FABER, -FADIR Normal muscle strength, sensation to light touch and DTRs in LEs bilaterally  PSYCH: pleasant and cooperative, no obvious depression or anxiety  ASSESSMENT AND PLAN:  Discussed the following assessment and plan:  Right-sided low back pain without sciatica -we discussed  possible serious and likely etiologies, workup and treatment, treatment risks and return precautions - suspect muscle spasm most likely -after this discussion, Eliz opted for heat, HEP, muscle relaxer at night if needed, OTC analgesics and topical products if needed -follow up advised 3-4 weeks -of course, we advised Alessia  to return or notify a doctor immediately if symptoms worsen or persist or new concerns arise.  .  -Patient advised to return or notify a doctor immediately if symptoms worsen or persist or new concerns arise.  Patient Instructions  Before you leave: -Low back exercises  Heat team minutes twice daily  Flexeril at night as needed per instructions  Can use Tylenol or Aleve as instructed on bottle along with topical sports creams with [place] or menthol for pain as needed.  Do the home physical therapy exercises at least 4 days per week.  Schedule follow-up with your doctor in 3-4 weeks. Of course, please follow up sooner if you are worsening or you have new concerns.     Colin Benton R.

## 2015-06-11 ENCOUNTER — Ambulatory Visit: Payer: No Typology Code available for payment source | Admitting: Family

## 2015-07-07 DIAGNOSIS — Z1283 Encounter for screening for malignant neoplasm of skin: Secondary | ICD-10-CM | POA: Diagnosis not present

## 2015-07-07 DIAGNOSIS — I781 Nevus, non-neoplastic: Secondary | ICD-10-CM | POA: Diagnosis not present

## 2015-07-20 ENCOUNTER — Ambulatory Visit (INDEPENDENT_AMBULATORY_CARE_PROVIDER_SITE_OTHER): Payer: BLUE CROSS/BLUE SHIELD | Admitting: Cardiology

## 2015-07-20 ENCOUNTER — Encounter (INDEPENDENT_AMBULATORY_CARE_PROVIDER_SITE_OTHER): Payer: BLUE CROSS/BLUE SHIELD

## 2015-07-20 ENCOUNTER — Encounter: Payer: Self-pay | Admitting: Cardiology

## 2015-07-20 VITALS — BP 130/98 | HR 74 | Ht 63.0 in | Wt 201.8 lb

## 2015-07-20 DIAGNOSIS — R002 Palpitations: Secondary | ICD-10-CM | POA: Diagnosis not present

## 2015-07-20 DIAGNOSIS — R012 Other cardiac sounds: Secondary | ICD-10-CM | POA: Diagnosis not present

## 2015-07-20 NOTE — Progress Notes (Signed)
PATIENT: Molly Quinn MRN: [id number] DOB: 12-Feb-1976 PCP: [name], MD  Clinic Note: Chief Complaint  Patient presents with  . New Patient (Initial Visit)  . Fatigue  . Palpitations    HPI: Molly Quinn is a 40 y.o. female with a PMH below who presents today for Cardiology evaluation.  Interval History: Molly Quinn is a pleasant 40 year old woman with a history of hemochromatosis and hyperlipidemia who presents here today for evaluation of palpitations.  She has chronic fatigue because back until early childhood. Even when she played intermural soccer in college she was just fatigued all the time. She has not had to have phlebotomy for hemochromatosis for quite some time now, so isn't sure if it's related to that or not. She is not very active, therefore she is not sure if she has exertional dyspnea or not. She denies any chest pain however. The extent of exercise for her is walking to the park with her daughter who is a toddler and therefore is not walking very fast. She has some mild exertional dyspnea, but denies any chest tightness or pressure. A little bit of maybe orthopnea but hard to tell. Which she describes having heart fluttering sensations that feel like a butterfly in her chest. She does feel little dizzy and nauseated with these spells. They last maybe a minute or 2, but have not been associated with any syncope or near-syncope. Otherwise she denies any orthopnea or edema. No syncope/near syncope or TIA/amaurosis fugax.   Past Medical History  Diagnosis Date  . Epilepsy ([place]) dx 1991    no breakthru while on meds, last in 1994  . Hemochromatosis dx 03/2005 in [place]    phlebotomy prn - 1x/yr or less, cbc q31mo  . Hyperlipidemia   . IBS (irritable bowel syndrome)   . Anxiety     Prior Cardiac Evaluation and Past Surgical History: Past Surgical History  Procedure Laterality Date  . Lymph node removal    . Repair ankle ligament    . Cesarean section  11/08/2010   Procedure: CESAREAN SECTION;  Surgeon: Daria Pastures;  Location: [facility];  Service: Gynecology;  Laterality: N/A;  . Bunionectomy      Allergies  Allergen Reactions  . Phenytoin Rash and Other (See Comments)    Reaction: pt passes out  . Sulfa Antibiotics Rash    Current Outpatient Prescriptions  Medication Sig Dispense Refill  . ALPRAZolam (XANAX) 0.5 MG tablet Take 1-2 tablets (0.5-1 mg total) by mouth 2 (two) times daily as needed for anxiety (for flight anxiety). 30 tablet 1  . cholecalciferol (VITAMIN D) 1000 units tablet Take 1,000 Units by mouth daily.    Marland Kitchen lamoTRIgine (LAMICTAL) 100 MG tablet Take 300 mg by mouth at bedtime.     . Omega-3 Fatty Acids (FISH OIL PO) Take 1 capsule by mouth 3 (three) times a week.     No current facility-administered medications for this visit.   Social History   Social History  . Marital Status: Married    Spouse Name: N/A  . Number of Children: 1  . Years of Education: N/A   Occupational History  . disability attorney    Social History Main Topics  . Smoking status: Never Smoker   . Smokeless tobacco: Never Used  . Alcohol Use: Yes     Comment: occasional  . Drug Use: No  . Sexual Activity: Not Asked   Other Topics Concern  . None   Social History Narrative  Family History  family history includes Breast cancer in her paternal grandmother; Cancer in her paternal aunt; Colon polyps in her maternal uncle; Diabetes in her maternal grandmother; Heart disease in her maternal grandmother and maternal uncle; Hemochromatosis in her brother; Hyperlipidemia in her father and mother; Hypertension in her brother, father, and mother; Melanoma (age of onset: 32) in her father; Other in her brother and brother; Ovarian cancer in her paternal aunt; Prostate cancer in her maternal uncle; Ulcerative colitis in her brother and mother. There is no history of Colon cancer.  ROS: A comprehensive Review of Systems - was performed. Review of  Systems  Constitutional: Positive for malaise/fatigue (She describes having "lifelong" poor energy. She always enjoy sleeping longer.).  HENT: Negative for congestion and nosebleeds.   Respiratory: Negative for cough, shortness of breath and wheezing.   Cardiovascular: Positive for palpitations.       Otherwise negative per history of present illness  Gastrointestinal: Positive for heartburn. Negative for nausea.  Musculoskeletal: Negative for myalgias, joint pain and falls.  Neurological: Positive for dizziness (When noting palpitations). Negative for headaches.  Psychiatric/Behavioral: Negative for memory loss. The patient is nervous/anxious. The patient does not have insomnia.        Although she doesn't say she is depressed, she definitely notes anhedonia, has a somewhat subdued mood.  All other systems reviewed and are negative.   PHYSICAL EXAM BP 130/98 mmHg  Pulse 74  Ht 5\' 3"  (1.6 m)  Wt 201 lb 12.8 oz (91.536 kg)  BMI 35.76 kg/m2 General appearance: alert, cooperative, appears stated age, no distress, moderately obese and Somewhat flat and dull affect and mood. Well-nourished and well-groomed Neck: no adenopathy, no carotid bruit, no JVD and supple, symmetrical, trachea midline Lungs: clear to auscultation bilaterally, normal percussion bilaterally and Nonlabored, good air movement Heart: RRR with normal S1 and S2. Soft S4 versus split S2. Otherwise no M/R/G. Abdomen: soft, non-tender; bowel sounds normal; no masses,  no organomegaly Extremities: extremities normal, atraumatic, no cyanosis or edema Pulses: 2+ and symmetric Skin: Skin color, texture, turgor normal. No rashes or lesions Neurologic: Alert and oriented X 3, normal strength and tone. Normal symmetric reflexes. Normal coordination and gait Cranial nerves: normal   Adult ECG Report  Rate: 74 ;  Rhythm: normal sinus rhythm  QRS Axis: 84 ;  PR Interval: 134 ;  QRS Duration: 92; QTc: 424;  Voltages: Normal   Conduction Disturbances: none  Other Abnormalities: none   Narrative Interpretation: Normal EKG  Recent Labs: No recent labs   ASSESSMENT / PLAN: 40 year old woman here with complaints of palpitations and some exertional dyspnea. She has a history of hemochromatosis and a soft S4 gallop, but no evidence of LVH on EKG.  We discussed wearing a monitor for her palpitations, she initially agrees to do that. We will also check an echocardiogram to evaluate for any LVH or any potential infiltrative disease related to hemochromatosis.  Problem List Items Addressed This Visit    Palpitations - Primary    These things don't really sound like true arrhythmias. Probably a more like PACs or PVCs. The only with an no what to do with them is to check a monitor. Unfortunately they don't seem to be happening frequently enough (not every day or every other day) to potentially catch an episode on a 48-hour monitor. Therefore I would need to do a 30 day event monitor.      Relevant Orders   EKG 12-Lead (Completed)   ECHOCARDIOGRAM COMPLETE  Cardiac event monitor   Hemochromatosis (Chronic)    No prior documented heart abnormalities. With soft S4 gallop, will check 2-D echocardiogram to ensure no LVH or infiltrative disease      Relevant Orders   EKG 12-Lead (Completed)   ECHOCARDIOGRAM COMPLETE   Cardiac event monitor    Other Visit Diagnoses    Abnormal fourth heart sound (S4)        Relevant Orders    EKG 12-Lead (Completed)    ECHOCARDIOGRAM COMPLETE    Cardiac event monitor       Meds ordered this encounter  Medications  . cholecalciferol (VITAMIN D) 1000 units tablet    Sig: Take 1,000 Units by mouth daily.  . Omega-3 Fatty Acids (FISH OIL PO)    Sig: Take 1 capsule by mouth 3 (three) times a week.    Followup: 6-8 months   Glenetta Hew, M.D., M.S. Interventional Cardiologist   Pager # [phone] Phone # [phone] [address] [place], [place]  [address]

## 2015-07-20 NOTE — Patient Instructions (Signed)
NO CHANGE WITH CURRENT MEDICATIONS   Your physician has requested that you have an echocardiogram at [facility] [address] . Echocardiography is a painless test that uses sound waves to create images of your heart. It provides your doctor with information about the size and shape of your heart and how well your heart's chambers and valves are working. This procedure takes approximately one hour. There are no restrictions for this procedure.   Your physician has recommended that you wear an event monitor  FOR 30 DAYS ( PALPITATIONS). Event monitors are medical devices that record the heart's electrical activity. Doctors most often [place] these monitors to diagnose arrhythmias. Arrhythmias are problems with the speed or rhythm of the heartbeat. The monitor is a small, portable device. You can wear one while you do your normal daily activities. This is usually used to diagnose what is causing palpitations/syncope (passing out).  Your physician recommends that you schedule a follow-up appointment in [place].

## 2015-07-21 ENCOUNTER — Telehealth: Payer: Self-pay | Admitting: Cardiology

## 2015-07-21 DIAGNOSIS — R002 Palpitations: Secondary | ICD-10-CM | POA: Diagnosis not present

## 2015-07-21 DIAGNOSIS — R012 Other cardiac sounds: Secondary | ICD-10-CM

## 2015-07-21 NOTE — Telephone Encounter (Signed)
Returned call to pt. She was calling to let us know she did not want to do the event monitor. She said it was a hindrance to her job and did not feel comfortable wearing the monitor. Pt denied having any other questions about why to wear the monitor. Pt stated she will mail it back. Pt did say she wanted to follow through with having the echo as scheduled. (Appt already scheduled.)  Will route to Dr Ellyn Hack and Trixie Dredge.

## 2015-07-21 NOTE — Telephone Encounter (Signed)
OK - will be hard to know what to do with the palpitations.  Leonie Man, MD

## 2015-07-21 NOTE — Telephone Encounter (Signed)
New Message  Pt requested to speak w/ RN concerning the heart monitor she has had since yesterday. Please call back and discuss.

## 2015-07-22 NOTE — Telephone Encounter (Signed)
Routing to [employer].

## 2015-07-23 ENCOUNTER — Encounter: Payer: Self-pay | Admitting: Cardiology

## 2015-07-23 DIAGNOSIS — R002 Palpitations: Secondary | ICD-10-CM | POA: Insufficient documentation

## 2015-07-23 NOTE — Assessment & Plan Note (Signed)
These things don't really sound like true arrhythmias. Probably a more like PACs or PVCs. The only with an no what to do with them is to check a monitor. Unfortunately they don't seem to be happening frequently enough (not every day or every other day) to potentially catch an episode on a 48-hour monitor. Therefore I would need to do a 30 day event monitor.

## 2015-07-23 NOTE — Assessment & Plan Note (Signed)
No prior documented heart abnormalities. With soft S4 gallop, will check 2-D echocardiogram to ensure no LVH or infiltrative disease

## 2015-08-03 ENCOUNTER — Ambulatory Visit (HOSPITAL_COMMUNITY): Payer: BLUE CROSS/BLUE SHIELD | Attending: Cardiology

## 2015-08-03 ENCOUNTER — Other Ambulatory Visit: Payer: Self-pay

## 2015-08-03 DIAGNOSIS — R012 Other cardiac sounds: Secondary | ICD-10-CM | POA: Diagnosis not present

## 2015-08-03 DIAGNOSIS — R002 Palpitations: Secondary | ICD-10-CM | POA: Diagnosis not present

## 2015-08-03 NOTE — Progress Notes (Signed)
Quick Note:  Echo results: Good news: Essentially normal echocardiogram and normal pump function and normal valve function. No regional wall motion abnormalities = No signs to suggest heart attack.. EF: 60-65%. No suggestion of infiltrative disease from hemochromatosis   Glenetta Hew, M.D., M.S. Interventional Cardiologist   Pager # [phone] Phone # [phone] [address] [place], [place] [address]     ______

## 2015-08-04 ENCOUNTER — Telehealth: Payer: Self-pay | Admitting: *Deleted

## 2015-08-04 NOTE — Telephone Encounter (Signed)
LEFT MESSAGE ON VOICEMAIL- RESULTS RELEASE TO MY CHART  ANY QUESTION MAY CALL BACK

## 2015-08-04 NOTE — Telephone Encounter (Signed)
-----   Message from Leonie Man, MD sent at 08/03/2015 11:57 PM EDT ----- Echo results: Good news: Essentially normal echocardiogram and normal pump function and normal valve function.  No regional wall motion abnormalities = No signs to suggest heart attack..  EF: 60-65%. No suggestion of infiltrative disease from hemochromatosis   Glenetta Hew, M.D., M.S. Interventional Cardiologist   Pager # [phone] Phone # [phone] [address] [place], [place] [address]

## 2015-08-20 DIAGNOSIS — R030 Elevated blood-pressure reading, without diagnosis of hypertension: Secondary | ICD-10-CM | POA: Diagnosis not present

## 2015-08-20 DIAGNOSIS — G40B09 Juvenile myoclonic epilepsy, not intractable, without status epilepticus: Secondary | ICD-10-CM | POA: Diagnosis not present

## 2015-08-20 DIAGNOSIS — R413 Other amnesia: Secondary | ICD-10-CM | POA: Diagnosis not present

## 2015-08-20 DIAGNOSIS — R569 Unspecified convulsions: Secondary | ICD-10-CM | POA: Diagnosis not present

## 2015-08-23 DIAGNOSIS — R413 Other amnesia: Secondary | ICD-10-CM | POA: Insufficient documentation

## 2015-08-23 DIAGNOSIS — R030 Elevated blood-pressure reading, without diagnosis of hypertension: Secondary | ICD-10-CM | POA: Insufficient documentation

## 2015-08-31 ENCOUNTER — Ambulatory Visit (INDEPENDENT_AMBULATORY_CARE_PROVIDER_SITE_OTHER): Payer: BLUE CROSS/BLUE SHIELD | Admitting: Cardiology

## 2015-08-31 ENCOUNTER — Encounter: Payer: Self-pay | Admitting: Cardiology

## 2015-08-31 VITALS — BP 119/82 | HR 71 | Ht 63.0 in | Wt 199.8 lb

## 2015-08-31 DIAGNOSIS — R002 Palpitations: Secondary | ICD-10-CM | POA: Diagnosis not present

## 2015-08-31 DIAGNOSIS — R5382 Chronic fatigue, unspecified: Secondary | ICD-10-CM

## 2015-08-31 NOTE — Progress Notes (Signed)
PCP: [name], MD  Clinic Note: Chief Complaint  Patient presents with  . Follow-up    Pt states no Sx - follow-up of echocardiogram and monitor for palpitations    HPI: Molly Quinn is a 40 y.o. female with a PMH below who presents today for 1 month follow-up of palpitations.  Molly Quinn was last seen on 07/20/2015 for initial consultation. She has a history of hemochromatosis that is well-controlled. She also has chronic fatigue. She came in with a sensation of heart fluttering with a butterfly her chest. These episodes are relatively fleeting. We decided to evaluate this with an echocardiogram which was done and relatively normal as well as a cardiac event monitor. She did not like wearing a monitor, and then turn it in. The existing data did not suggest any arrhythmias.  Recent Hospitalizations: None  Studies Reviewed:   Foreshortened cardiac event monitor: 59-05/12/2015. Mostly sinus rhythm with sinus tachycardia up to 101. No arrhythmias, PACs or PVCs noted no events noted.  2-D echocardiogram 08/03/2015: EF 60-65% with normal LV thickness. No wall motion abnormality. No suggestion of infiltrative disease. Normal diastolic pressures.   Interval History: Molly Quinn presents today actually feeling better overall. She has not really noticed as much of the palpitations as she did before. I think since she heard the results of her echocardiogram, she felt more comfortable. She has occasional skipping but nothing that bothers her now. No lightheadedness dizziness or syncope/near syncope. No chest tightness or pressure or dyspnea with exertion exertion. No PND, orthopnea or edema. No lightheadedness, dizziness, weakness or syncope/near syncope. No TIA/amaurosis fugax symptoms. No melena, hematochezia, hematuria, or epstaxis. No claudication.  She still feels fatigued, but not really dyspneic.  ROS: A comprehensive was performed. Review of Systems  Constitutional:  Positive for malaise/fatigue.  HENT: Negative for nosebleeds.   Respiratory: Negative for shortness of breath.   Cardiovascular: Negative for leg swelling.  Musculoskeletal: Positive for joint pain.  Neurological: Negative for dizziness and headaches.  Psychiatric/Behavioral:       Some of her symptoms sound like depression symptoms  All other systems reviewed and are negative.    Past Medical History  Diagnosis Date  . Epilepsy ([place]) dx 1991    no breakthru while on meds, last in 1994  . Hemochromatosis dx 03/2005 in [place]    phlebotomy prn - 1x/yr or less, cbc q76mo  . Hyperlipidemia   . IBS (irritable bowel syndrome)   . Anxiety     Past Surgical History  Procedure Laterality Date  . Lymph node removal    . Repair ankle ligament    . Cesarean section  11/08/2010    Procedure: CESAREAN SECTION;  Surgeon: Daria Pastures;  Location: [facility];  Service: Gynecology;  Laterality: N/A;  . Bunionectomy    . Transthoracic echocardiogram  February 2017    EF 60-65%. Normal wall thickness &  wall motion. No infiltrative disease. Normal diastolic pressures.    Prior to Admission medications   Medication Sig Start Date End Date Taking? Authorizing Provider  ALPRAZolam Duanne Moron) 0.5 MG tablet Take 1-2 tablets (0.5-1 mg total) by mouth 2 (two) times daily as needed for anxiety (for flight anxiety). 09/03/14  Yes Cassandria Anger, MD  cholecalciferol (VITAMIN D) 1000 units tablet Take 1,000 Units by mouth daily.   Yes Historical Provider, MD  lamoTRIgine (LAMICTAL) 100 MG tablet Take 300 mg by mouth at bedtime.    Yes Historical Provider, MD  Omega-3 Fatty Acids (  FISH OIL PO) Take 1 capsule by mouth 3 (three) times a week.   Yes Historical Provider, MD   Allergies  Allergen Reactions  . Phenytoin Rash and Other (See Comments)    Reaction: pt passes out  . Sulfa Antibiotics Rash     Social History   Social History  . Marital Status: Married    Spouse Name: N/A  . Number of  Children: 1  . Years of Education: N/A   Occupational History  . disability attorney    Social History Main Topics  . Smoking status: Never Smoker   . Smokeless tobacco: Never Used  . Alcohol Use: Yes     Comment: occasional  . Drug Use: No  . Sexual Activity: Not Asked   Other Topics Concern  . None   Social History Narrative   Family History  Problem Relation Age of Onset  . Ulcerative colitis Mother   . Hypertension Mother   . Hyperlipidemia Mother   . Ulcerative colitis Brother     coloectomy age 4  . Diabetes Maternal Grandmother   . Heart disease Maternal Grandmother   . Hypertension Father   . Hyperlipidemia Father   . Melanoma Father 67  . Hypertension Brother   . Breast cancer Paternal Grandmother   . Colon cancer Neg Hx   . Heart disease Maternal Uncle   . Prostate cancer Maternal Uncle   . Colon polyps Maternal Uncle   . Hemochromatosis Brother   . Other Brother     [place]  . Other Brother     bile duct cancer  . Ovarian cancer Paternal Aunt   . Cancer Paternal Aunt     ovar ca     Wt Readings from Last 3 Encounters:  08/31/15 199 lb 12.8 oz (90.629 kg)  07/20/15 201 lb 12.8 oz (91.536 kg)  06/10/15 201 lb 9.6 oz (91.445 kg)    PHYSICAL EXAM BP 119/82 mmHg  Pulse 71  Ht 5\' 3"  (1.6 m)  Wt 199 lb 12.8 oz (90.629 kg)  BMI 35.40 kg/m2 General appearance: alert, cooperative, appears stated age, no distress and Well-nourished, well-groomed. But moderately obese. Neck: no adenopathy, no carotid bruit and no JVD Lungs: clear to auscultation bilaterally, normal percussion bilaterally and non-labored Heart: regular rate and rhythm, S1, S2 normal, no murmur, click, rub or gallop; nondisplaced PMI. Possibly physiologic split S2, no S4 Abdomen: soft, non-tender; bowel sounds normal; no masses,  no organomegaly;  Extremities: extremities normal, atraumatic, no cyanosis, and no edema   ASSESSMENT / PLAN: Problem List Items Addressed This Visit     Palpitations - Primary    Symptoms have improved. She seems relieved to know that the echocardiogram was normal. Was reluctant to wear a monitor. She says it was uncomfortable and revealing. Unfortunately, without the monitor, we can't tell what the symptoms were treated if they recur, we may want to be try a monitor. If not, no reason to continue to evaluate.  We talked about vagal maneuvers and biofeedback techniques.      Fatigue    Heart to say that this is related to cardiac etiology based on her normal function by echo. No real risk factors to recommend ischemic evaluation.         Current medicines are reviewed at length with the patient today. (+/- concerns) None The following changes have been made: NoneStudies Ordered:   No orders of the defined types were placed in this encounter.     Follow-up as  needed.   Glenetta Hew, M.D., M.S. Interventional Cardiologist   Pager # [phone] Phone # [phone] [address] [place], [place] [address]

## 2015-08-31 NOTE — Patient Instructions (Signed)
NO CHANGES WITH CURRENT MEDICATIONS    Your physician recommends that you schedule a follow-up appointment AS NEEDED

## 2015-09-02 ENCOUNTER — Encounter: Payer: Self-pay | Admitting: Cardiology

## 2015-09-02 NOTE — Assessment & Plan Note (Signed)
Heart to say that this is related to cardiac etiology based on her normal function by echo. No real risk factors to recommend ischemic evaluation.

## 2015-09-02 NOTE — Assessment & Plan Note (Signed)
Symptoms have improved. She seems relieved to know that the echocardiogram was normal. Was reluctant to wear a monitor. She says it was uncomfortable and revealing. Unfortunately, without the monitor, we can't tell what the symptoms were treated if they recur, we may want to be try a monitor. If not, no reason to continue to evaluate.  We talked about vagal maneuvers and biofeedback techniques.

## 2015-10-07 DIAGNOSIS — H5213 Myopia, bilateral: Secondary | ICD-10-CM | POA: Diagnosis not present

## 2015-11-17 ENCOUNTER — Other Ambulatory Visit: Payer: Self-pay | Admitting: Obstetrics and Gynecology

## 2015-11-17 DIAGNOSIS — N6001 Solitary cyst of right breast: Secondary | ICD-10-CM

## 2015-11-22 ENCOUNTER — Telehealth: Payer: Self-pay | Admitting: *Deleted

## 2015-11-22 NOTE — Telephone Encounter (Signed)
Call from pt requesting to reschedule lab/office to 9/28. Reviewed with Dr. Benay Spice: OK to add @4PM  on 9/28. Message to schedulers to call pt.

## 2015-11-26 ENCOUNTER — Telehealth: Payer: Self-pay | Admitting: Oncology

## 2015-11-26 NOTE — Telephone Encounter (Signed)
lvm to inform pt of 9/28 appt per LOS

## 2015-11-29 ENCOUNTER — Encounter: Payer: Self-pay | Admitting: Internal Medicine

## 2015-11-30 ENCOUNTER — Ambulatory Visit
Admission: RE | Admit: 2015-11-30 | Discharge: 2015-11-30 | Disposition: A | Discharge status: Home / Self Care | Payer: BLUE CROSS/BLUE SHIELD | Source: Ambulatory Visit | Attending: Obstetrics and Gynecology | Admitting: Obstetrics and Gynecology

## 2015-11-30 DIAGNOSIS — N6011 Diffuse cystic mastopathy of right breast: Secondary | ICD-10-CM | POA: Diagnosis not present

## 2015-11-30 DIAGNOSIS — N6001 Solitary cyst of right breast: Secondary | ICD-10-CM

## 2015-11-30 MED ORDER — ALPRAZOLAM 0.5 MG PO TABS: 0.5000 mg | ORAL_TABLET | Freq: Two times a day (BID) | ORAL | 1 refills | Status: DC | PRN

## 2015-11-30 NOTE — Telephone Encounter (Signed)
sch rov pls

## 2015-12-01 NOTE — Telephone Encounter (Signed)
Called refill into pharmacy spoke w/Kent gave MD approval.../lmb

## 2015-12-02 ENCOUNTER — Ambulatory Visit: Payer: BLUE CROSS/BLUE SHIELD | Admitting: Oncology

## 2015-12-02 ENCOUNTER — Other Ambulatory Visit: Payer: BLUE CROSS/BLUE SHIELD

## 2015-12-08 ENCOUNTER — Other Ambulatory Visit: Payer: Self-pay | Admitting: *Deleted

## 2015-12-09 ENCOUNTER — Other Ambulatory Visit (HOSPITAL_BASED_OUTPATIENT_CLINIC_OR_DEPARTMENT_OTHER): Payer: BLUE CROSS/BLUE SHIELD

## 2015-12-09 ENCOUNTER — Ambulatory Visit (HOSPITAL_BASED_OUTPATIENT_CLINIC_OR_DEPARTMENT_OTHER): Payer: BLUE CROSS/BLUE SHIELD | Admitting: Oncology

## 2015-12-09 ENCOUNTER — Telehealth: Payer: Self-pay | Admitting: Oncology

## 2015-12-09 NOTE — Telephone Encounter (Signed)
Gave patient avs report and appointments for march and September

## 2015-12-09 NOTE — Progress Notes (Signed)
  Molly Quinn OFFICE PROGRESS NOTE   Diagnosis: hemachromatosis  INTERVAL HISTORY:   Molly Quinn returns as scheduled. She feels well. She reports "memory loss ". She is seeing a neurologist and relates the memory loss to the use of lamictal. No seizures.she last underwent phlebotomy in October 2016.  Objective:  Vital signs in last 24 hours:  Blood pressure 123/76, pulse 81, temperature 98.6 F (37 C), temperature source Oral, resp. rate 18, height 5\' 3"  (1.6 m), weight 201 lb 8 oz (91.4 kg), SpO2 99 %.     Resp: lungs clear bilaterally Cardio: regular rate and rhythm GI: no hepatosplenomegaly, nontender Vascular: no leg edema   Lab Results:  Ferritin on 06/02/2015-30  Medications: I have reviewed the patient's current medications.  Assessment/Plan: 1. Hereditary hemochromatosis: She last underwent phlebotomy therapy in July 2011.  2. Seizure disorder: Maintained on Lamictal. 3. History of an abnormal liver ultrasound-negative abdomen CT 04/09/2014 4. Status post cesarean section delivery of a female baby in August 2012.     Disposition:  She appears stable. We will follow-up on the ferritin level from today and recommend phlebotomy therapy as indicated. She will return for a lab visit in 6 months and an office visit in one year. She will continue follow-up with her neurologist for management of epilepsy and seizure medications.  Betsy Coder, MD  12/09/2015  4:43 PM

## 2015-12-10 LAB — FERRITIN: Ferritin: 52 ng/ml (ref 9–269)

## 2015-12-13 ENCOUNTER — Telehealth: Payer: Self-pay | Admitting: *Deleted

## 2015-12-13 NOTE — Telephone Encounter (Signed)
Per Dr. Benay Spice, patient notified that ferritin is borderline but ok and that we will repeat in 3 months and phlebotomy may be needed at that time.  Patient appreciative of call and has no questions or concerns at this time.

## 2015-12-13 NOTE — Telephone Encounter (Signed)
-----   Message from Ladell Pier, MD sent at 12/10/2015  4:40 PM EDT ----- Please call patient, ferritin is borderline but ok , repeat 3 months-may need phlebotomy then

## 2015-12-14 ENCOUNTER — Telehealth: Payer: Self-pay | Admitting: Oncology

## 2015-12-14 NOTE — Telephone Encounter (Signed)
lvm to inform pt of 12/21 appt per LOS

## 2015-12-15 ENCOUNTER — Telehealth: Payer: Self-pay | Admitting: Oncology

## 2015-12-15 NOTE — Telephone Encounter (Signed)
12/21 Appointment rescheduled per patient request to 12/20.

## 2015-12-21 DIAGNOSIS — Z23 Encounter for immunization: Secondary | ICD-10-CM | POA: Diagnosis not present

## 2016-01-18 DIAGNOSIS — F330 Major depressive disorder, recurrent, mild: Secondary | ICD-10-CM | POA: Diagnosis not present

## 2016-01-18 DIAGNOSIS — R4189 Other symptoms and signs involving cognitive functions and awareness: Secondary | ICD-10-CM | POA: Diagnosis not present

## 2016-01-18 DIAGNOSIS — R51 Headache: Secondary | ICD-10-CM | POA: Diagnosis not present

## 2016-01-18 DIAGNOSIS — G40B19 Juvenile myoclonic epilepsy, intractable, without status epilepticus: Secondary | ICD-10-CM | POA: Diagnosis not present

## 2016-01-18 DIAGNOSIS — R569 Unspecified convulsions: Secondary | ICD-10-CM | POA: Diagnosis not present

## 2016-01-18 DIAGNOSIS — F419 Anxiety disorder, unspecified: Secondary | ICD-10-CM | POA: Diagnosis not present

## 2016-02-25 ENCOUNTER — Encounter: Payer: Self-pay | Admitting: Internal Medicine

## 2016-02-27 DIAGNOSIS — J209 Acute bronchitis, unspecified: Secondary | ICD-10-CM | POA: Diagnosis not present

## 2016-03-01 ENCOUNTER — Other Ambulatory Visit (HOSPITAL_BASED_OUTPATIENT_CLINIC_OR_DEPARTMENT_OTHER): Payer: BLUE CROSS/BLUE SHIELD

## 2016-03-02 ENCOUNTER — Other Ambulatory Visit: Payer: BLUE CROSS/BLUE SHIELD

## 2016-03-02 LAB — FERRITIN: Ferritin: 130 ng/ml (ref 9–269)

## 2016-03-03 ENCOUNTER — Telehealth: Payer: Self-pay | Admitting: Medical Oncology

## 2016-03-03 NOTE — Telephone Encounter (Signed)
Per Dr Benay Spice , I told pt her ferritin was elevated and she needs phlebotomies every 2 weeks for 4 treatments. I put in schedule request for phlebotomies to start after jan 2 and late afternoon appts per pt request.I also requested cbc with number 3 phlebotomy and ferritin 1-2 months after last phlebotomy. Pt voices understanding.

## 2016-03-14 ENCOUNTER — Telehealth: Payer: Self-pay | Admitting: Oncology

## 2016-03-14 NOTE — Telephone Encounter (Signed)
Left message re 1/5 appointment. Patient to get new schedule 1/5.

## 2016-03-17 ENCOUNTER — Ambulatory Visit (HOSPITAL_BASED_OUTPATIENT_CLINIC_OR_DEPARTMENT_OTHER): Payer: BLUE CROSS/BLUE SHIELD

## 2016-03-17 NOTE — Patient Instructions (Signed)
Therapeutic Phlebotomy Therapeutic phlebotomy is the controlled removal of blood from a person's body for the purpose of treating a medical condition. The procedure is similar to donating blood. Usually, about a pint (470 mL, or 0.47L) of blood is removed. The average adult has 9-12 pints (4.3-5.7 L) of blood. Therapeutic phlebotomy may be used to treat the following medical conditions:  Hemochromatosis. This is a condition in which the blood contains too much iron.  Polycythemia vera. This is a condition in which the blood contains too many red blood cells.  Porphyria cutanea tarda. This is a disease in which an important part of hemoglobin is not made properly. It results in the buildup of abnormal amounts of porphyrins in the body.  Sickle cell disease. This is a condition in which the red blood cells form an abnormal crescent shape rather than a round shape. Tell a health care provider about:  Any allergies you have.  All medicines you are taking, including vitamins, herbs, eye drops, creams, and over-the-counter medicines.  Any problems you or family members have had with anesthetic medicines.  Any blood disorders you have.  Any surgeries you have had.  Any medical conditions you have. What are the risks? Generally, this is a safe procedure. However, problems may occur, including:  Nausea or light-headedness.  Low blood pressure.  Soreness, bleeding, swelling, or bruising at the needle insertion site.  Infection. What happens before the procedure?  Follow instructions from your health care provider about eating or drinking restrictions.  Ask your health care provider about changing or stopping your regular medicines. This is especially important if you are taking diabetes medicines or blood thinners.  Wear clothing with sleeves that can be raised above the elbow.  Plan to have someone take you home after the procedure.  You may have a blood sample taken. What happens  during the procedure?  A needle will be inserted into one of your veins.  Tubing and a collection bag will be attached to that needle.  Blood will flow through the needle and tubing into the collection bag.  You may be asked to open and close your hand slowly and continually during the entire collection.  After the specified amount of blood has been removed from your body, the collection bag and tubing will be clamped.  The needle will be removed from your vein.  Pressure will be held on the site of the needle insertion to stop the bleeding.  A bandage (dressing) will be placed over the needle insertion site. The procedure may vary among health care providers and hospitals. What happens after the procedure?  Your recovery will be assessed and monitored.  You can return to your normal activities as directed by your health care provider. This information is not intended to replace advice given to you by your health care provider. Make sure you discuss any questions you have with your health care provider. Document Released: 08/01/2010 Document Revised: 10/30/2015 Document Reviewed: 02/23/2014 Elsevier Interactive Patient Education  2017 [employer]  

## 2016-03-17 NOTE — Progress Notes (Signed)
Phlebotomy started at 1631 using a 16 gauge phlebotomy kit to the left AC. 532 grams removed with phlebotomy ending at 1642. Snack provided to patient. Patient monitored for 30 minutes post phlebotomy. Patient and vital signs stable upon discharge.  

## 2016-03-17 NOTE — Addendum Note (Signed)
Addended by: San Morelle on: 03/17/2016 05:38 PM   Modules accepted: Orders

## 2016-03-31 ENCOUNTER — Ambulatory Visit (HOSPITAL_BASED_OUTPATIENT_CLINIC_OR_DEPARTMENT_OTHER): Payer: BLUE CROSS/BLUE SHIELD

## 2016-03-31 NOTE — Patient Instructions (Signed)
Therapeutic Phlebotomy Therapeutic phlebotomy is the controlled removal of blood from a person's body for the purpose of treating a medical condition. The procedure is similar to donating blood. Usually, about a pint (470 mL, or 0.47L) of blood is removed. The average adult has 9-12 pints (4.3-5.7 L) of blood. Therapeutic phlebotomy may be used to treat the following medical conditions:  Hemochromatosis. This is a condition in which the blood contains too much iron.  Polycythemia vera. This is a condition in which the blood contains too many red blood cells.  Porphyria cutanea tarda. This is a disease in which an important part of hemoglobin is not made properly. It results in the buildup of abnormal amounts of porphyrins in the body.  Sickle cell disease. This is a condition in which the red blood cells form an abnormal crescent shape rather than a round shape. Tell a health care provider about:  Any allergies you have.  All medicines you are taking, including vitamins, herbs, eye drops, creams, and over-the-counter medicines.  Any problems you or family members have had with anesthetic medicines.  Any blood disorders you have.  Any surgeries you have had.  Any medical conditions you have. What are the risks? Generally, this is a safe procedure. However, problems may occur, including:  Nausea or light-headedness.  Low blood pressure.  Soreness, bleeding, swelling, or bruising at the needle insertion site.  Infection. What happens before the procedure?  Follow instructions from your health care provider about eating or drinking restrictions.  Ask your health care provider about changing or stopping your regular medicines. This is especially important if you are taking diabetes medicines or blood thinners.  Wear clothing with sleeves that can be raised above the elbow.  Plan to have someone take you home after the procedure.  You may have a blood sample taken. What happens  during the procedure?  A needle will be inserted into one of your veins.  Tubing and a collection bag will be attached to that needle.  Blood will flow through the needle and tubing into the collection bag.  You may be asked to open and close your hand slowly and continually during the entire collection.  After the specified amount of blood has been removed from your body, the collection bag and tubing will be clamped.  The needle will be removed from your vein.  Pressure will be held on the site of the needle insertion to stop the bleeding.  A bandage (dressing) will be placed over the needle insertion site. The procedure may vary among health care providers and hospitals. What happens after the procedure?  Your recovery will be assessed and monitored.  You can return to your normal activities as directed by your health care provider. This information is not intended to replace advice given to you by your health care provider. Make sure you discuss any questions you have with your health care provider. Document Released: 08/01/2010 Document Revised: 10/30/2015 Document Reviewed: 02/23/2014 Elsevier Interactive Patient Education  2017 [employer]  

## 2016-04-14 ENCOUNTER — Other Ambulatory Visit (HOSPITAL_BASED_OUTPATIENT_CLINIC_OR_DEPARTMENT_OTHER): Payer: BLUE CROSS/BLUE SHIELD

## 2016-04-14 ENCOUNTER — Ambulatory Visit: Payer: BLUE CROSS/BLUE SHIELD

## 2016-04-14 NOTE — Progress Notes (Signed)
Pt receiving therapeutic phlebotomy today. 1605 16G phlebotomy needle attempted in R AC. 40G removed, but line clotted. 18G attempted in R lateral AC.  Notified Dr. Gearldine Shown RN that pt would not like to be stuck again and postpone phlebotomy until scheduled visit in two weeks. Dr. Benay Spice aware and to contact pt if any issues. Pt verbalized understanding and will call if she has any complaints prior to her visit on 04/28/16.

## 2016-04-17 LAB — FERRITIN: Ferritin: 29 ng/ml (ref 9–269)

## 2016-04-19 ENCOUNTER — Encounter (HOSPITAL_COMMUNITY): Payer: Self-pay | Admitting: Emergency Medicine

## 2016-04-19 ENCOUNTER — Emergency Department (HOSPITAL_COMMUNITY)
Admission: EM | Admit: 2016-04-19 | Discharge: 2016-04-19 | Disposition: A | Discharge status: Home / Self Care | Payer: Worker's Compensation | Attending: Emergency Medicine | Admitting: Emergency Medicine

## 2016-04-19 DIAGNOSIS — Y92410 Unspecified street and highway as the place of occurrence of the external cause: Secondary | ICD-10-CM | POA: Diagnosis not present

## 2016-04-19 DIAGNOSIS — Y939 Activity, unspecified: Secondary | ICD-10-CM | POA: Diagnosis not present

## 2016-04-19 DIAGNOSIS — M25512 Pain in left shoulder: Secondary | ICD-10-CM | POA: Insufficient documentation

## 2016-04-19 DIAGNOSIS — M542 Cervicalgia: Secondary | ICD-10-CM | POA: Diagnosis not present

## 2016-04-19 DIAGNOSIS — Y999 Unspecified external cause status: Secondary | ICD-10-CM | POA: Insufficient documentation

## 2016-04-19 DIAGNOSIS — M549 Dorsalgia, unspecified: Secondary | ICD-10-CM | POA: Insufficient documentation

## 2016-04-19 MED ORDER — NAPROXEN 250 MG PO TABS: 250.0000 mg | ORAL_TABLET | Freq: Two times a day (BID) | ORAL | 0 refills | Status: DC

## 2016-04-19 MED ORDER — METHOCARBAMOL 500 MG PO TABS: 500.0000 mg | ORAL_TABLET | Freq: Two times a day (BID) | ORAL | 0 refills | Status: DC | PRN
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
## 2016-04-19 NOTE — ED Notes (Signed)
Patient was alert, oriented and stable upon discharge. RN went over AVS and patient had no further questions.  Patient was instructed not to drive or operate heavy machinery on muscle relaxer medication.
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
## 2016-04-19 NOTE — ED Notes (Signed)
ED Provider at bedside. 

## 2016-04-28 ENCOUNTER — Ambulatory Visit (HOSPITAL_BASED_OUTPATIENT_CLINIC_OR_DEPARTMENT_OTHER): Payer: BLUE CROSS/BLUE SHIELD

## 2016-04-28 NOTE — Patient Instructions (Signed)
Therapeutic Phlebotomy Therapeutic phlebotomy is the controlled removal of blood from a person's body for the purpose of treating a medical condition. The procedure is similar to donating blood. Usually, about a pint (470 mL, or 0.47L) of blood is removed. The average adult has 9-12 pints (4.3-5.7 L) of blood. Therapeutic phlebotomy may be used to treat the following medical conditions:  Hemochromatosis. This is a condition in which the blood contains too much iron.  Polycythemia vera. This is a condition in which the blood contains too many red blood cells.  Porphyria cutanea tarda. This is a disease in which an important part of hemoglobin is not made properly. It results in the buildup of abnormal amounts of porphyrins in the body.  Sickle cell disease. This is a condition in which the red blood cells form an abnormal crescent shape rather than a round shape. Tell a health care provider about:  Any allergies you have.  All medicines you are taking, including vitamins, herbs, eye drops, creams, and over-the-counter medicines.  Any problems you or family members have had with anesthetic medicines.  Any blood disorders you have.  Any surgeries you have had.  Any medical conditions you have. What are the risks? Generally, this is a safe procedure. However, problems may occur, including:  Nausea or light-headedness.  Low blood pressure.  Soreness, bleeding, swelling, or bruising at the needle insertion site.  Infection. What happens before the procedure?  Follow instructions from your health care provider about eating or drinking restrictions.  Ask your health care provider about changing or stopping your regular medicines. This is especially important if you are taking diabetes medicines or blood thinners.  Wear clothing with sleeves that can be raised above the elbow.  Plan to have someone take you home after the procedure.  You may have a blood sample taken. What happens  during the procedure?  A needle will be inserted into one of your veins.  Tubing and a collection bag will be attached to that needle.  Blood will flow through the needle and tubing into the collection bag.  You may be asked to open and close your hand slowly and continually during the entire collection.  After the specified amount of blood has been removed from your body, the collection bag and tubing will be clamped.  The needle will be removed from your vein.  Pressure will be held on the site of the needle insertion to stop the bleeding.  A bandage (dressing) will be placed over the needle insertion site. The procedure may vary among health care providers and hospitals. What happens after the procedure?  Your recovery will be assessed and monitored.  You can return to your normal activities as directed by your health care provider. This information is not intended to replace advice given to you by your health care provider. Make sure you discuss any questions you have with your health care provider. Document Released: 08/01/2010 Document Revised: 10/30/2015 Document Reviewed: 02/23/2014 Elsevier Interactive Patient Education  2017 [employer]  

## 2016-04-28 NOTE — Progress Notes (Signed)
Dr. Benay Spice okay to perform phlebotomy despite Ferritin of 29 from 04/14/16.

## 2016-06-07 ENCOUNTER — Other Ambulatory Visit: Payer: BLUE CROSS/BLUE SHIELD

## 2016-06-09 ENCOUNTER — Other Ambulatory Visit (HOSPITAL_BASED_OUTPATIENT_CLINIC_OR_DEPARTMENT_OTHER): Payer: BLUE CROSS/BLUE SHIELD

## 2016-06-09 DIAGNOSIS — F4010 Social phobia, unspecified: Secondary | ICD-10-CM | POA: Diagnosis not present

## 2016-06-12 ENCOUNTER — Telehealth: Payer: Self-pay | Admitting: *Deleted

## 2016-06-12 LAB — FERRITIN: Ferritin: 17 ng/ml (ref 9–269)

## 2016-06-12 NOTE — Telephone Encounter (Signed)
Patient notified per order of Dr. Benay Spice that ferritin looks good and to f/u as scheduled.  Patient appreciative of call and has no questions or concerns at this time.

## 2016-06-12 NOTE — Telephone Encounter (Signed)
-----   Message from Ladell Pier, MD sent at 06/12/2016  5:31 PM EDT ----- Please call patient, ferritin looks good,f/u as scheduled

## 2016-06-23 DIAGNOSIS — Z6837 Body mass index (BMI) 37.0-37.9, adult: Secondary | ICD-10-CM | POA: Diagnosis not present

## 2016-06-23 DIAGNOSIS — Z01419 Encounter for gynecological examination (general) (routine) without abnormal findings: Secondary | ICD-10-CM | POA: Diagnosis not present

## 2016-06-26 ENCOUNTER — Other Ambulatory Visit: Payer: Self-pay | Admitting: Obstetrics and Gynecology

## 2016-06-26 DIAGNOSIS — Z1231 Encounter for screening mammogram for malignant neoplasm of breast: Secondary | ICD-10-CM

## 2016-06-26 DIAGNOSIS — N6310 Unspecified lump in the right breast, unspecified quadrant: Secondary | ICD-10-CM

## 2016-07-05 ENCOUNTER — Other Ambulatory Visit: Payer: Self-pay | Admitting: Obstetrics and Gynecology

## 2016-07-05 DIAGNOSIS — N6310 Unspecified lump in the right breast, unspecified quadrant: Secondary | ICD-10-CM

## 2016-07-07 ENCOUNTER — Ambulatory Visit
Admission: RE | Admit: 2016-07-07 | Discharge: 2016-07-07 | Disposition: A | Discharge status: Home / Self Care | Payer: BLUE CROSS/BLUE SHIELD | Source: Ambulatory Visit | Attending: Obstetrics and Gynecology | Admitting: Obstetrics and Gynecology

## 2016-07-07 DIAGNOSIS — N6011 Diffuse cystic mastopathy of right breast: Secondary | ICD-10-CM | POA: Diagnosis not present

## 2016-07-07 DIAGNOSIS — N6310 Unspecified lump in the right breast, unspecified quadrant: Secondary | ICD-10-CM

## 2016-07-07 DIAGNOSIS — R922 Inconclusive mammogram: Secondary | ICD-10-CM | POA: Diagnosis not present

## 2016-11-25 DIAGNOSIS — J019 Acute sinusitis, unspecified: Secondary | ICD-10-CM | POA: Diagnosis not present

## 2016-11-29 DIAGNOSIS — Z23 Encounter for immunization: Secondary | ICD-10-CM | POA: Diagnosis not present

## 2016-12-07 ENCOUNTER — Ambulatory Visit (HOSPITAL_BASED_OUTPATIENT_CLINIC_OR_DEPARTMENT_OTHER): Payer: BLUE CROSS/BLUE SHIELD | Admitting: Oncology

## 2016-12-07 ENCOUNTER — Other Ambulatory Visit (HOSPITAL_BASED_OUTPATIENT_CLINIC_OR_DEPARTMENT_OTHER): Payer: BLUE CROSS/BLUE SHIELD

## 2016-12-07 ENCOUNTER — Other Ambulatory Visit: Payer: Self-pay | Admitting: *Deleted

## 2016-12-07 ENCOUNTER — Telehealth: Payer: Self-pay | Admitting: Oncology

## 2016-12-07 LAB — FERRITIN: Ferritin: 32 ng/ml (ref 9–269)

## 2016-12-07 NOTE — Telephone Encounter (Signed)
Gave patient AVS and calendar of upcoming March 2019 appointment.

## 2016-12-07 NOTE — Progress Notes (Signed)
  [facility] OFFICE PROGRESS NOTE   Diagnosis: Hemochromatosis     INTERVAL HISTORY:   Molly Quinn returns as scheduled. She feels well. No complaint. She completed a series of phlebotomy treatments beginning in January 2018 after the ferritin returned elevated in December 2017. She tolerated the phlebotomy well except for discomfort at the right arm following the last phlebotomy treatment. This resolved.  Objective:  Vital signs in last 24 hours:  Blood pressure 126/78, pulse 85, temperature 99.9 F (37.7 C), temperature source Oral, resp. rate 18, height 5\' 3"  (1.6 m), weight 198 lb 8 oz (90 kg), SpO2 98 %.    Resp: Lungs clear bilaterally Cardio: Regular rate and rhythm GI: No hepatosplenomegaly, no mass, nontender Vascular: No leg edema   Lab Results:  Lab Results  Component Value Date   WBC 9.0 05/22/2014   HGB 14.4 05/22/2014   HCT 42.0 05/22/2014   MCV 90.6 05/22/2014   PLT 297.0 05/22/2014   NEUTROABS 6.1 05/22/2014   Ferritin on 3 32018-17  Medications: I have reviewed the patient's current medications.  Assessment/Plan: 1. Hereditary hemochromatosis: She last underwent phlebotomy therapy in February 2018 2. Seizure disorder: Maintained on Lamictal. 3. History of an abnormal liver ultrasound-negative abdomen CT 04/09/2014 4. Status post cesarean section delivery of a female baby in August 2012.    Disposition:  Molly Quinn appears stable. We will follow-up on the ferritin level from today. The plan is to initiate phlebotomy therapy for a ferritin greater than 50.  She will return for a lab visit in 6 months and an office visit in one year.  15 minutes were spent with the patient today. The majority of the time was used for counseling and coordination of care.  Donneta Romberg, MD  12/07/2016  3:56 PM

## 2016-12-08 ENCOUNTER — Telehealth: Payer: Self-pay

## 2016-12-08 NOTE — Telephone Encounter (Signed)
Call placed to patient to review her lab results per MD Sherrill. Pt verbalizes understanding and agrees with plan of care.

## 2016-12-08 NOTE — Telephone Encounter (Signed)
-----   Message from Ladell Pier, MD sent at 12/07/2016  5:28 PM EDT ----- Please call patient, ferritin level is in the goal range, follow-up as scheduled

## 2016-12-25 ENCOUNTER — Telehealth: Payer: Self-pay | Admitting: Internal Medicine

## 2016-12-25 NOTE — Telephone Encounter (Signed)
error 

## 2017-01-23 ENCOUNTER — Ambulatory Visit (INDEPENDENT_AMBULATORY_CARE_PROVIDER_SITE_OTHER): Payer: BLUE CROSS/BLUE SHIELD | Admitting: Internal Medicine

## 2017-01-23 ENCOUNTER — Encounter: Payer: Self-pay | Admitting: Internal Medicine

## 2017-01-23 VITALS — BP 126/84 | HR 81 | Temp 98.4°F | Ht 63.0 in | Wt 199.0 lb

## 2017-01-23 DIAGNOSIS — F339 Major depressive disorder, recurrent, unspecified: Secondary | ICD-10-CM | POA: Diagnosis not present

## 2017-01-23 DIAGNOSIS — Z0000 Encounter for general adult medical examination without abnormal findings: Secondary | ICD-10-CM | POA: Insufficient documentation

## 2017-01-23 DIAGNOSIS — F410 Panic disorder [episodic paroxysmal anxiety] without agoraphobia: Secondary | ICD-10-CM

## 2017-01-23 DIAGNOSIS — F32A Depression, unspecified: Secondary | ICD-10-CM | POA: Insufficient documentation

## 2017-01-23 DIAGNOSIS — F329 Major depressive disorder, single episode, unspecified: Secondary | ICD-10-CM | POA: Insufficient documentation

## 2017-01-23 MED ORDER — ALPRAZOLAM 0.5 MG PO TABS: 0.5000 mg | ORAL_TABLET | Freq: Two times a day (BID) | ORAL | 1 refills | Status: DC | PRN

## 2017-01-23 NOTE — Patient Instructions (Signed)
Valerian root 

## 2017-01-23 NOTE — Assessment & Plan Note (Signed)
Psychol ref  

## 2017-01-23 NOTE — Assessment & Plan Note (Signed)
Pt was ref to see Dr Rexene Edison Not interested in meds

## 2017-01-23 NOTE — Progress Notes (Signed)
Subjective:  Patient ID: Molly Quinn, female    DOB: 1975-08-27  Age: 41 y.o. MRN: [id number]  CC: No chief complaint on file.   HPI Molly Quinn presents for a well exam. C/o anxiety and depression. C/o fear of flying. Was seeing Dr Toy Care in the past  Outpatient Medications Prior to Visit  Medication Sig Dispense Refill  . ALPRAZolam (XANAX) 0.5 MG tablet Take 1-2 tablets (0.5-1 mg total) by mouth 2 (two) times daily as needed for anxiety (for flight anxiety). 30 tablet 1  . cholecalciferol (VITAMIN D) 1000 units tablet Take 1,000 Units by mouth daily.    Marland Kitchen ibuprofen (ADVIL,MOTRIN) 200 MG tablet Take 400 mg by mouth every 6 (six) hours as needed.    . lamoTRIgine (LAMICTAL) 100 MG tablet Take 300 mg by mouth at bedtime.      No facility-administered medications prior to visit.     ROS Review of Systems  Constitutional: Negative for activity change, appetite change, chills, fatigue and unexpected weight change.  HENT: Positive for congestion. Negative for mouth sores and sinus pressure.   Eyes: Negative for visual disturbance.  Respiratory: Negative for cough and chest tightness.   Gastrointestinal: Negative for abdominal pain and nausea.  Genitourinary: Negative for difficulty urinating, frequency and vaginal pain.  Musculoskeletal: Negative for back pain and gait problem.  Skin: Negative for pallor and rash.  Neurological: Negative for dizziness, tremors, weakness, numbness and headaches.  Psychiatric/Behavioral: Positive for dysphoric mood. Negative for confusion, sleep disturbance and suicidal ideas. The patient is nervous/anxious.     Objective:  BP 126/84 (BP Location: Left Arm, Patient Position: Sitting, Cuff Size: Large)   Pulse 81   Temp 98.4 F (36.9 C) (Oral)   Ht 5\' 3"  (1.6 m)   Wt 199 lb (90.3 kg)   SpO2 99%   BMI 35.25 kg/m   BP Readings from Last 3 Encounters:  01/23/17 126/84  12/07/16 126/78  04/28/16 (!) 126/91    Wt Readings from Last 3  Encounters:  01/23/17 199 lb (90.3 kg)  12/07/16 198 lb 8 oz (90 kg)  04/19/16 190 lb (86.2 kg)    Physical Exam  Constitutional: She appears well-developed. No distress.  HENT:  Head: Normocephalic.  Right Ear: External ear normal.  Left Ear: External ear normal.  Nose: Nose normal.  Mouth/Throat: Oropharynx is clear and moist.  Eyes: Conjunctivae are normal. Pupils are equal, round, and reactive to light. Right eye exhibits no discharge. Left eye exhibits no discharge.  Neck: Normal range of motion. Neck supple. No JVD present. No tracheal deviation present. No thyromegaly present.  Cardiovascular: Normal rate, regular rhythm and normal heart sounds.  Pulmonary/Chest: No stridor. No respiratory distress. She has no wheezes.  Abdominal: Soft. Bowel sounds are normal. She exhibits no distension and no mass. There is no tenderness. There is no rebound and no guarding.  Musculoskeletal: She exhibits no edema or tenderness.  Lymphadenopathy:    She has no cervical adenopathy.  Neurological: She displays normal reflexes. No cranial nerve deficit. She exhibits normal muscle tone. Coordination normal.  Skin: No rash noted. No erythema.  Psychiatric: She has a normal mood and affect. Her behavior is normal. Judgment and thought content normal.  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 Annual evaluation, left breast. EXAM: 2D DIGITAL DIAGNOSTIC BILATERAL MAMMOGRAM WITH CAD AND ADJUNCT TOMO ULTRASOUND RIGHT BREAST COMPARISON:  Mammography 05/27/2015, 05/18/2015. Right breast ultrasound 11/30/2015, 05/27/2015. ACR Breast Density Category c: The breast tissue is heterogeneously dense, which may obscure small masses. FINDINGS: Standard 2D and tomosynthesis full field CC and MLO views of both breasts were obtained. The previously identified mass in the upper inner right breast at anterior depth is less conspicuous on today's examination. No new or suspicious findings elsewhere in the right breast. No findings suspicious for malignancy in the left breast. Mammographic images were processed with CAD. On physical exam, there is no palpable abnormality in the upper inner subareolar right breast. Targeted right breast ultrasound is performed, showing the previously identified multicystic mass at the 1 o'clock subareolar position has decreased in size in the interval, current measurements of approximately 2 x 8 x 5 mm (previously 5 x 8 x 5 mm). No suspicious solid mass or abnormal acoustic shadowing is identified. IMPRESSION: 1. 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 2. No mammographic or sonographic evidence of malignancy, right breast. 3. No mammographic evidence of malignancy, left breast. RECOMMENDATION: Screening mammogram in one year.(Code:SM-B-01Y) I have discussed the findings and recommendations with the patient. Results were also provided in writing at the conclusion of the visit. If applicable, a reminder letter will be sent to the patient regarding the next appointment. BI-RADS CATEGORY  2: Benign. Electronically Signed   By: Evangeline Dakin M.D.   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 Annual evaluation, left breast. EXAM: 2D DIGITAL DIAGNOSTIC BILATERAL MAMMOGRAM WITH CAD AND ADJUNCT TOMO ULTRASOUND RIGHT BREAST COMPARISON:  Mammography 05/27/2015, 05/18/2015. Right breast ultrasound 11/30/2015, 05/27/2015. ACR Breast Density Category c: The breast tissue is heterogeneously dense, which may obscure small masses. FINDINGS: Standard 2D and tomosynthesis full field CC and MLO views of both breasts were obtained. The previously identified mass in the upper inner right breast at anterior depth is less conspicuous on today's examination. No new or suspicious findings elsewhere in the right breast. No findings suspicious for malignancy in the left breast. Mammographic images were processed with CAD. On physical exam, there is no palpable abnormality in the upper inner subareolar right breast. Targeted right breast ultrasound is performed, showing the previously identified multicystic mass at the 1 o'clock subareolar position has decreased in size in the interval, current measurements of approximately 2 x 8 x 5 mm (previously 5 x 8 x 5 mm). No suspicious solid mass or abnormal acoustic shadowing is identified. IMPRESSION: 1. 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 2. No mammographic or sonographic evidence of malignancy,  right breast. 3. No mammographic evidence of malignancy, left breast. RECOMMENDATION: Screening mammogram in one year.(Code:SM-B-01Y) I have discussed the findings and recommendations with the patient. Results were also provided in writing at the conclusion of the visit. If applicable, a reminder letter will be sent to the patient regarding the next appointment. BI-RADS CATEGORY  2: Benign. Electronically Signed   By: Evangeline Dakin M.D.   On: 07/07/2016  09:42    Assessment & Plan:   There are no diagnoses linked to this encounter. I am having Molly Quinn maintain her lamoTRIgine, cholecalciferol, ALPRAZolam, and ibuprofen.  No orders of the defined types were placed in this encounter.    Follow-up: No Follow-up on file.  [name], MD

## 2017-01-23 NOTE — Assessment & Plan Note (Signed)
We discussed age appropriate health related issues, including available/recomended screening tests and vaccinations. We discussed a need for adhering to healthy diet and exercise. Labs were ordered to be later reviewed . All questions were answered.   

## 2017-02-09 ENCOUNTER — Ambulatory Visit (INDEPENDENT_AMBULATORY_CARE_PROVIDER_SITE_OTHER): Payer: BLUE CROSS/BLUE SHIELD | Admitting: Family

## 2017-02-09 ENCOUNTER — Encounter: Payer: Self-pay | Admitting: Family

## 2017-02-09 ENCOUNTER — Telehealth: Payer: Self-pay | Admitting: Internal Medicine

## 2017-02-09 ENCOUNTER — Other Ambulatory Visit (INDEPENDENT_AMBULATORY_CARE_PROVIDER_SITE_OTHER): Payer: BLUE CROSS/BLUE SHIELD

## 2017-02-09 VITALS — BP 130/68 | HR 64 | Temp 98.1°F | Ht 63.0 in | Wt 210.0 lb

## 2017-02-09 DIAGNOSIS — R0989 Other specified symptoms and signs involving the circulatory and respiratory systems: Secondary | ICD-10-CM | POA: Diagnosis not present

## 2017-02-09 DIAGNOSIS — Z0000 Encounter for general adult medical examination without abnormal findings: Secondary | ICD-10-CM

## 2017-02-09 LAB — BASIC METABOLIC PANEL
BUN: 10 mg/dL (ref 6–23)
CHLORIDE: 102 meq/L (ref 96–112)
CO2: 30 meq/L (ref 19–32)
Calcium: 9.4 mg/dL (ref 8.4–10.5)
Creatinine, Ser: 0.86 mg/dL (ref 0.40–1.20)
GFR: 76.68 mL/min (ref >60.00)
GLUCOSE: 108 mg/dL — AB (ref 70–99)
POTASSIUM: 4.1 meq/L (ref 3.5–5.1)
Sodium: 138 mEq/L (ref 135–145)

## 2017-02-09 LAB — CBC WITH DIFFERENTIAL/PLATELET
Basophils Absolute: 0.1 10*3/uL (ref 0.0–0.1)
Basophils Relative: 0.6 % (ref 0.0–3.0)
EOS PCT: 0 % (ref 0.0–5.0)
Eosinophils Absolute: 0 10*3/uL (ref 0.0–0.7)
HCT: 42.8 % (ref 36.0–46.0)
HEMOGLOBIN: 14.6 g/dL (ref 12.0–15.0)
Lymphocytes Relative: 23.4 % (ref 12.0–46.0)
Lymphs Abs: 2.2 10*3/uL (ref 0.7–4.0)
MCHC: 34.1 g/dL (ref 30.0–36.0)
MCV: 91.5 fl (ref 78.0–100.0)
MONO ABS: 0.5 10*3/uL (ref 0.1–1.0)
MONOS PCT: 5.1 % (ref 3.0–12.0)
Neutro Abs: 6.7 10*3/uL (ref 1.4–7.7)
Neutrophils Relative %: 70.9 % (ref 43.0–77.0)
Platelets: 321 10*3/uL (ref 150.0–400.0)
RBC: 4.68 Mil/uL (ref 3.87–5.11)
RDW: 13 % (ref 11.5–15.5)
WBC: 9.4 10*3/uL (ref 4.0–10.5)

## 2017-02-09 LAB — URINALYSIS, ROUTINE W REFLEX MICROSCOPIC
BILIRUBIN URINE: NEGATIVE
KETONES UR: NEGATIVE
LEUKOCYTES UA: NEGATIVE
NITRITE: NEGATIVE
Specific Gravity, Urine: 1.01 (ref 1.000–1.030)
TOTAL PROTEIN, URINE-UPE24: NEGATIVE
URINE GLUCOSE: NEGATIVE
UROBILINOGEN UA: 0.2 (ref 0.0–1.0)
WBC, UA: NONE SEEN (ref 0–?)
pH: 6.5 (ref 5.0–8.0)

## 2017-02-09 LAB — LIPID PANEL
Cholesterol: 207 mg/dL — ABNORMAL HIGH (ref 0–200)
HDL: 38.2 mg/dL — ABNORMAL LOW (ref >39.00)
LDL CALC: 144 mg/dL — AB (ref 0–99)
NONHDL: 168.51
Total CHOL/HDL Ratio: 5
Triglycerides: 123 mg/dL (ref 0.0–149.0)
VLDL: 24.6 mg/dL (ref 0.0–40.0)

## 2017-02-09 LAB — HEPATIC FUNCTION PANEL
ALT: 28 U/L (ref 0–35)
AST: 20 U/L (ref 0–37)
Albumin: 4.2 g/dL (ref 3.5–5.2)
Alkaline Phosphatase: 83 U/L (ref 39–117)
BILIRUBIN DIRECT: 0.1 mg/dL (ref 0.0–0.3)
BILIRUBIN TOTAL: 0.7 mg/dL (ref 0.2–1.2)
Total Protein: 6.7 g/dL (ref 6.0–8.3)

## 2017-02-09 LAB — TSH: TSH: 2.03 u[IU]/mL (ref 0.35–4.50)

## 2017-02-09 MED ORDER — TRIAMCINOLONE ACETONIDE 55 MCG/ACT NA AERO: 2.0000 | INHALATION_SPRAY | Freq: Every day | NASAL | 12 refills | Status: DC

## 2017-02-09 MED ORDER — AMOXICILLIN-POT CLAVULANATE 875-125 MG PO TABS: 1.0000 | ORAL_TABLET | Freq: Two times a day (BID) | ORAL | 0 refills | Status: DC

## 2017-02-09 NOTE — Progress Notes (Signed)
Molly Quinn is a 41 y.o. female with the following history as recorded in [employer]:  Patient Active Problem List   Diagnosis Date Noted  . Well adult exam 01/23/2017  . Depression 01/23/2017  . Palpitations 07/23/2015  . Nonallopathic lesion of cervical region 09/17/2014  . Nonallopathic lesion-rib cage 09/17/2014  . Nonallopathic lesion of thoracic region 09/17/2014  . Neck pain on right side 09/03/2014  . Right hand pain 09/03/2014  . Uterine fibroid 04/15/2014  . Abdominal pain, left lower quadrant 04/01/2014  . Cough 03/10/2014  . Allergic rhinitis 07/03/2013  . URI, acute 07/03/2013  . Fatigue 08/26/2012  . Headache(784.0) 08/26/2012  . Hyperglycemia   . Irritable bowel syndrome 11/02/2008  . HYPERLIPIDEMIA 10/27/2008  . Anxiety attack 10/27/2008  . Epilepsy ([place]) 10/27/2008  . 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 ALPRAZolam (XANAX) 0.5 MG tablet Take 1-2 tablets (0.5-1 mg total) 2 (two) times daily as needed by mouth for anxiety (for flight anxiety). 30 tablet 1  . amoxicillin-clavulanate (AUGMENTIN) 875-125 MG tablet Take 1 tablet by mouth 2 (two) times daily. 20 tablet 0  . cholecalciferol (VITAMIN D) 1000 units tablet Take 1,000 Units by mouth daily.    Marland Kitchen ibuprofen (ADVIL,MOTRIN) 200 MG tablet Take 400 mg by mouth every 6 (six) hours as needed.    . lamoTRIgine (LAMICTAL) 100 MG tablet Take 300 mg by mouth at bedtime.     . triamcinolone (NASACORT) 55 MCG/ACT AERO nasal inhaler Place 2 sprays into the nose daily. 1 Inhaler 12   No current facility-administered medications for this visit.     Allergies: Phenytoin and Sulfa antibiotics  Past Medical History:  Diagnosis Date  . Anxiety   . Epilepsy ([place]) dx 1991   no breakthru while on meds, last in 1994  . Hemochromatosis dx 03/2005 in [place]   phlebotomy prn - 1x/yr or less, cbc q74mo  . Hyperlipidemia   . IBS (irritable bowel syndrome)     Past  Surgical History:  Procedure Laterality Date  . BUNIONECTOMY    . CESAREAN SECTION  11/08/2010   Procedure: CESAREAN SECTION;  Surgeon: Daria Pastures;  Location: [facility];  Service: Gynecology;  Laterality: N/A;  . lymph node removal    . REPAIR ANKLE LIGAMENT    . TRANSTHORACIC ECHOCARDIOGRAM  February 2017   EF 60-65%. Normal wall thickness &  wall motion. No infiltrative disease. Normal diastolic pressures.    Family History  Problem Relation Age of Onset  . Ulcerative colitis Mother   . Hypertension Mother   . Hyperlipidemia Mother   . Hypertension Father   . Hyperlipidemia Father   . Melanoma Father 71  . Ovarian cancer Paternal Aunt   . Cancer Paternal Aunt        ovar ca  . Ulcerative colitis Brother        coloectomy age 22  . Diabetes Maternal Grandmother   . Heart disease Maternal Grandmother   . Hypertension Brother   . Breast cancer Paternal Grandmother   . Heart disease Maternal Uncle   . Prostate cancer Maternal Uncle   . Colon polyps Maternal Uncle   . Hemochromatosis Brother   . Other Brother        [place]  . Other Brother        bile duct cancer  . Colon cancer Neg Hx     Social History   Tobacco Use  . Smoking status: Never Smoker  .  Smokeless tobacco: Never Used  Substance Use Topics  . Alcohol use: Yes    Comment: occasional    Subjective:  Patient presents with concerns for 4 day history of cough/ congestion; has been using OTC Zyrtec with some relief; denies any fever but feels some sinus pressure; does have Flonase at home but does not feel it has been beneficial; has concerns that has chronic allergies- wonders about seeing a specialist; also has history of broken nose;  Objective:  Vitals:   02/09/17 0936  BP: 130/68  Pulse: 64  Temp: 98.1 F (36.7 C)  TempSrc: Oral  SpO2: 99%  Weight: 210 lb (95.3 kg)  Height: 5\' 3"  (1.6 m)    General: Well developed, well nourished, in no acute distress  Skin : Warm and dry.  Head: Normocephalic  and atraumatic  Eyes: Sclera and conjunctiva clear; pupils round and reactive to light; extraocular movements intact  Ears: External normal; canals clear; tympanic membranes congested bilaterally  Oropharynx: Pink, supple. No suspicious lesions  Neck: Supple without thyromegaly, adenopathy  Lungs: Respirations unlabored; clear to auscultation bilaterally without wheeze, rales, rhonchi  CVS exam: normal rate, regular rhythm, normal  Neurologic: Alert and oriented; speech intact; face symmetrical; moves all extremities well; CNII-XII intact without focal deficit  Assessment:  1. Chronic sinus complaints     Plan:  Suspect underling allergy component; continue Zyrtec and try using Nasacort AQ; she is given Rx for Augmentin 875 mg bid x 10 days to hold and fill only if symptoms worsening in the next 48-72 hours; she is referred to ENT to discuss deviated septum/ testing for allergies; follow-up worse, no better.   No Follow-up on file.  Orders Placed This Encounter  Procedures  . Ambulatory referral to ENT    Referral Priority:   Routine    Referral Type:   Consultation    Referral Reason:   Specialty Services Required    Requested Specialty:   Otolaryngology    Number of Visits Requested:   1    Requested Prescriptions   Signed Prescriptions Disp Refills  . triamcinolone (NASACORT) 55 MCG/ACT AERO nasal inhaler 1 Inhaler 12    Sig: Place 2 sprays into the nose daily.  Marland Kitchen amoxicillin-clavulanate (AUGMENTIN) 875-125 MG tablet 20 tablet 0    Sig: Take 1 tablet by mouth 2 (two) times daily.

## 2017-02-09 NOTE — Telephone Encounter (Signed)
-----   Message from Merril Abbe, [place] sent at 02/06/2017 12:42 PM EST ----- FYI:  Your patient is scheduled to see Dr. Rexene Edison 03/15/17. Pt is aware.  Thank you for the referral.

## 2017-02-12 ENCOUNTER — Other Ambulatory Visit: Payer: Self-pay | Admitting: Internal Medicine

## 2017-02-12 DIAGNOSIS — R7309 Other abnormal glucose: Secondary | ICD-10-CM

## 2017-03-15 ENCOUNTER — Ambulatory Visit (INDEPENDENT_AMBULATORY_CARE_PROVIDER_SITE_OTHER): Payer: BLUE CROSS/BLUE SHIELD | Admitting: Psychology

## 2017-03-15 DIAGNOSIS — F331 Major depressive disorder, recurrent, moderate: Secondary | ICD-10-CM

## 2017-04-03 DIAGNOSIS — R0982 Postnasal drip: Secondary | ICD-10-CM | POA: Diagnosis not present

## 2017-04-03 DIAGNOSIS — K219 Gastro-esophageal reflux disease without esophagitis: Secondary | ICD-10-CM | POA: Diagnosis not present

## 2017-04-09 ENCOUNTER — Ambulatory Visit (INDEPENDENT_AMBULATORY_CARE_PROVIDER_SITE_OTHER): Payer: BLUE CROSS/BLUE SHIELD | Admitting: Psychology

## 2017-04-09 DIAGNOSIS — F331 Major depressive disorder, recurrent, moderate: Secondary | ICD-10-CM | POA: Diagnosis not present

## 2017-04-13 DIAGNOSIS — H5213 Myopia, bilateral: Secondary | ICD-10-CM | POA: Diagnosis not present

## 2017-04-20 ENCOUNTER — Ambulatory Visit (INDEPENDENT_AMBULATORY_CARE_PROVIDER_SITE_OTHER): Payer: BLUE CROSS/BLUE SHIELD | Admitting: Psychology

## 2017-04-20 DIAGNOSIS — F331 Major depressive disorder, recurrent, moderate: Secondary | ICD-10-CM | POA: Diagnosis not present

## 2017-04-21 DIAGNOSIS — B349 Viral infection, unspecified: Secondary | ICD-10-CM | POA: Diagnosis not present

## 2017-04-25 ENCOUNTER — Encounter (HOSPITAL_COMMUNITY): Payer: Self-pay | Admitting: Emergency Medicine

## 2017-04-25 ENCOUNTER — Other Ambulatory Visit: Payer: Self-pay

## 2017-04-25 ENCOUNTER — Emergency Department (HOSPITAL_COMMUNITY)
Admission: EM | Admit: 2017-04-25 | Discharge: 2017-04-26 | Disposition: A | Discharge status: Home / Self Care | Payer: BLUE CROSS/BLUE SHIELD | Attending: Emergency Medicine | Admitting: Emergency Medicine

## 2017-04-25 DIAGNOSIS — R202 Paresthesia of skin: Secondary | ICD-10-CM

## 2017-04-25 DIAGNOSIS — N3000 Acute cystitis without hematuria: Secondary | ICD-10-CM | POA: Diagnosis not present

## 2017-04-25 DIAGNOSIS — E860 Dehydration: Secondary | ICD-10-CM | POA: Diagnosis not present

## 2017-04-25 DIAGNOSIS — Z79899 Other long term (current) drug therapy: Secondary | ICD-10-CM | POA: Diagnosis not present

## 2017-04-25 DIAGNOSIS — R350 Frequency of micturition: Secondary | ICD-10-CM | POA: Diagnosis not present

## 2017-04-25 LAB — BASIC METABOLIC PANEL
Anion gap: 12 (ref 5–15)
BUN: 11 mg/dL (ref 6–20)
CHLORIDE: 101 mmol/L (ref 101–111)
CO2: 24 mmol/L (ref 22–32)
CREATININE: 0.8 mg/dL (ref 0.44–1.00)
Calcium: 9.1 mg/dL (ref 8.9–10.3)
GFR calc Af Amer: >60 mL/min (ref >60)
GFR calc non Af Amer: >60 mL/min (ref >60)
Glucose, Bld: 184 mg/dL — ABNORMAL HIGH (ref 65–99)
POTASSIUM: 3.5 mmol/L (ref 3.5–5.1)
SODIUM: 137 mmol/L (ref 135–145)

## 2017-04-25 LAB — I-STAT BETA HCG BLOOD, ED (MC, WL, AP ONLY): I-stat hCG, quantitative: <5 m[IU]/mL (ref <5)

## 2017-04-25 LAB — CBC
HEMATOCRIT: 39.8 % (ref 36.0–46.0)
Hemoglobin: 14.2 g/dL (ref 12.0–15.0)
MCH: 31.3 pg (ref 26.0–34.0)
MCHC: 35.7 g/dL (ref 30.0–36.0)
MCV: 87.7 fL (ref 78.0–100.0)
PLATELETS: 229 10*3/uL (ref 150–400)
RBC: 4.54 MIL/uL (ref 3.87–5.11)
RDW: 12 % (ref 11.5–15.5)
WBC: 7.1 10*3/uL (ref 4.0–10.5)

## 2017-04-25 LAB — CBG MONITORING, ED: Glucose-Capillary: 157 mg/dL — ABNORMAL HIGH (ref 65–99)

## 2017-04-25 NOTE — ED Triage Notes (Signed)
Patient states around 8:15 pm, patient states she got really tired, head/leg/arm started tingling on the right side. Patient states she thinks she is having a stroke.

## 2017-04-25 NOTE — ED Notes (Signed)
Bed: [facility] Expected date:  Expected time:  Means of arrival:  Comments: 

## 2017-04-25 NOTE — ED Notes (Signed)
Bed: [facility]4 Expected date:  Expected time:  Means of arrival:  Comments: 

## 2017-04-26 LAB — URINALYSIS, ROUTINE W REFLEX MICROSCOPIC
Bilirubin Urine: NEGATIVE
GLUCOSE, UA: NEGATIVE mg/dL
KETONES UR: 5 mg/dL — AB
Nitrite: NEGATIVE
Protein, ur: 100 mg/dL — AB
Specific Gravity, Urine: 1.011 (ref 1.005–1.030)
pH: 6 (ref 5.0–8.0)

## 2017-04-26 MED ORDER — FOSFOMYCIN TROMETHAMINE 3 G PO PACK
3.0000 g | PACK | Freq: Once | ORAL | Status: DC
  Filled 2017-04-26: qty 3

## 2017-04-26 NOTE — ED Provider Notes (Signed)
[facility] DEPT Provider Note   CSN: [id number] Arrival date & time: 04/25/17  2018     History   Chief Complaint Chief Complaint  Patient presents with  . Weakness  . Fatigue  . Numbness    HPI Molly Quinn is a 42 y.o. female.  The history is provided by the patient. No language interpreter was used.  Neurologic Problem  This is a new problem. The current episode started 6 to 12 hours ago. The problem occurs constantly. The problem has been rapidly improving. Pertinent negatives include no chest pain, no abdominal pain, no headaches and no shortness of breath. Nothing aggravates the symptoms. Nothing relieves the symptoms. She has tried nothing for the symptoms. The treatment provided no relief.    Past Medical History:  Diagnosis Date  . Anxiety   . Epilepsy ([place]) dx 1991   no breakthru while on meds, last in 1994  . Epilepsy ([place])   . Hemochromatosis dx 03/2005 in [place]   phlebotomy prn - 1x/yr or less, cbc q76mo  . Hyperlipidemia   . IBS (irritable bowel syndrome)     Patient Active Problem List   Diagnosis Date Noted  . Well adult exam 01/23/2017  . Depression 01/23/2017  . Palpitations 07/23/2015  . Nonallopathic lesion of cervical region 09/17/2014  . Nonallopathic lesion-rib cage 09/17/2014  . Nonallopathic lesion of thoracic region 09/17/2014  . Neck pain on right side 09/03/2014  . Right hand pain 09/03/2014  . Uterine fibroid 04/15/2014  . Abdominal pain, left lower quadrant 04/01/2014  . Cough 03/10/2014  . Allergic rhinitis 07/03/2013  . URI, acute 07/03/2013  . Fatigue 08/26/2012  . Headache(784.0) 08/26/2012  . Hyperglycemia   . Irritable bowel syndrome 11/02/2008  . HYPERLIPIDEMIA 10/27/2008  . Anxiety attack 10/27/2008  . Epilepsy ([place]) 10/27/2008  . Hemochromatosis 09/20/2007    Past Surgical History:  Procedure Laterality Date  . BUNIONECTOMY    . CESAREAN SECTION  11/08/2010   Procedure: CESAREAN  SECTION;  Surgeon: Daria Pastures;  Location: [facility];  Service: Gynecology;  Laterality: N/A;  . lymph node removal    . REPAIR ANKLE LIGAMENT    . TRANSTHORACIC ECHOCARDIOGRAM  February 2017   EF 60-65%. Normal wall thickness &  wall motion. No infiltrative disease. Normal diastolic pressures.    OB History    Gravida Para Term Preterm AB Living   1 1 1     1    SAB TAB Ectopic Multiple Live Births           1       Home Medications    Prior to Admission medications   Medication Sig Start Date End Date Taking? Authorizing Provider  cholecalciferol (VITAMIN D) 1000 units tablet Take 1,000 Units by mouth daily.   Yes [provider]  ibuprofen (ADVIL,MOTRIN) 200 MG tablet Take 400 mg by mouth every 6 (six) hours as needed for moderate pain.    Yes [provider]  LAMICTAL XR 100 MG TB24 24 hour tablet Take 300 mg by mouth at bedtime. 04/09/17  Yes [provider]  omeprazole (PRILOSEC) 40 MG capsule Take 40 mg by mouth 2 (two) times daily. 04/03/17  Yes [provider]  triamcinolone (NASACORT) 55 MCG/ACT AERO nasal inhaler Place 2 sprays into the nose daily. 02/09/17  Yes Marrian Salvage, FNP  ALPRAZolam Duanne Moron) 0.5 MG tablet Take 1-2 tablets (0.5-1 mg total) 2 (two) times daily as needed by mouth for anxiety (  for flight anxiety). Patient not taking: Reported on 04/26/2017 01/23/17   Plotnikov, Evie Lacks, MD  amoxicillin-clavulanate (AUGMENTIN) 875-125 MG tablet Take 1 tablet by mouth 2 (two) times daily. Patient not taking: Reported on 04/26/2017 02/09/17   Marrian Salvage, FNP    Family History Family History  Problem Relation Age of Onset  . Ulcerative colitis Mother   . Hypertension Mother   . Hyperlipidemia Mother   . Hypertension Father   . Hyperlipidemia Father   . Melanoma Father 73  . Ovarian cancer Paternal Aunt   . Cancer Paternal Aunt        ovar ca  . Ulcerative colitis Brother        coloectomy age 12  .  Diabetes Maternal Grandmother   . Heart disease Maternal Grandmother   . Hypertension Brother   . Breast cancer Paternal Grandmother   . Heart disease Maternal Uncle   . Prostate cancer Maternal Uncle   . Colon polyps Maternal Uncle   . Hemochromatosis Brother   . Other Brother        Lohman  . Other Brother        bile duct cancer  . Colon cancer Neg Hx     Social History Social History   Tobacco Use  . Smoking status: Never Smoker  . Smokeless tobacco: Never Used  Substance Use Topics  . Alcohol use: Yes    Comment: occasional  . Drug use: No     Allergies   Phenytoin and Sulfa antibiotics   Review of Systems Review of Systems  Constitutional: Negative for chills, diaphoresis, fatigue and fever.  HENT: Negative for congestion, rhinorrhea and sinus pain.   Eyes: Negative for photophobia and visual disturbance.  Respiratory: Negative for cough, chest tightness, shortness of breath and wheezing.   Cardiovascular: Negative for chest pain and palpitations.  Gastrointestinal: Negative for abdominal pain, constipation, diarrhea, nausea and vomiting.  Genitourinary: Positive for frequency. Negative for dyspareunia, dysuria and flank pain.  Musculoskeletal: Negative for neck pain and neck stiffness.  Neurological: Positive for seizures (hx). Negative for dizziness, tremors, syncope, facial asymmetry, speech difficulty, weakness, light-headedness, numbness and headaches.  Psychiatric/Behavioral: Negative for agitation.  All other systems reviewed and are negative.    Physical Exam Updated Vital Signs BP (!) 154/92 (BP Location: Left Arm)   Pulse 83   Temp 98.8 F (37.1 C) (Oral)   Resp (!) 21   Ht 5\' 3"  (1.6 m)   Wt 87.1 kg (192 lb)   LMP 04/25/2017   SpO2 97%   BMI 34.01 kg/m   Physical Exam  Constitutional: She is oriented to person, place, and time. She appears well-developed and well-nourished. No distress.  HENT:  Head: Normocephalic and atraumatic.  Nose:  Nose normal.  Mouth/Throat: Oropharynx is clear and moist. No oropharyngeal exudate.  Eyes: Conjunctivae and EOM are normal. Pupils are equal, round, and reactive to light.  Neck: Normal range of motion. Neck supple.  Cardiovascular: Normal rate and intact distal pulses.  No murmur heard. Pulmonary/Chest: Effort normal and breath sounds normal. No stridor. No respiratory distress. She has no wheezes. She exhibits no tenderness.  Abdominal: Soft. Bowel sounds are normal. She exhibits no distension and no mass. There is no guarding.  Musculoskeletal: She exhibits no edema or tenderness.  Neurological: She is alert and oriented to person, place, and time. She displays normal reflexes. No cranial nerve deficit or sensory deficit. She exhibits normal muscle tone. Coordination normal.  Skin: Skin is  warm. Capillary refill takes less than 2 seconds. She is not diaphoretic. No erythema.  Psychiatric: She has a normal mood and affect.  Nursing note and vitals reviewed.    ED Treatments / Results  Labs (all labs ordered are listed, but only abnormal results are displayed) Labs Reviewed  BASIC METABOLIC PANEL - Abnormal; Notable for the following components:      Result Value   Glucose, Bld 184 (*)    All other components within normal limits  URINALYSIS, ROUTINE W REFLEX MICROSCOPIC - Abnormal; Notable for the following components:   Color, Urine RED (*)    APPearance HAZY (*)    Hgb urine dipstick LARGE (*)    Ketones, ur 5 (*)    Protein, ur 100 (*)    Leukocytes, UA TRACE (*)    Bacteria, UA RARE (*)    Squamous Epithelial / LPF 0-5 (*)    All other components within normal limits  CBG MONITORING, ED - Abnormal; Notable for the following components:   Glucose-Capillary 157 (*)    All other components within normal limits  CBC  I-STAT BETA HCG BLOOD, ED ([facility], [facility], [facility] ONLY)    EKG  EKG Interpretation  Date/Time:  Wednesday April 25 2017 21:50:06 EST Ventricular Rate:  88 PR  Interval:    QRS Duration: 92 QT Interval:  363 QTC Calculation: 440 R Axis:   94 Text Interpretation:  Sinus rhythm Borderline right axis deviation When compared with ECG of 11/09/2010, Rightward axis is now present Confirmed by Delora Fuel ([id number]) on 04/26/2017 3:45:12 AM Also confirmed by Delora Fuel ([id number]), editor [place], Beverly ([id number])  on 04/26/2017 7:28:29 AM       Radiology No results found.  Procedures Procedures (including critical care time)  Medications Ordered in ED Medications  fosfomycin (MONUROL) packet 3 g (3 g Oral Refused 04/26/17 0733)     Initial Impression / Assessment and Plan / ED Course  I have reviewed the triage vital signs and the nursing notes.  Pertinent labs & imaging results that were available during my care of the patient were reviewed by me and considered in my medical decision making (see chart for details).     LARRIE LUCIA is a 42 y.o. female with a past medical history significant for epilepsy, IBS, and anxiety who presents for right-sided tingling.  Patient reports that she began having tingling around 7 PM this evening while watching TV.  She describes a tingling in her right head, right arm, and right leg.  She denies any history of this sensation.  She denied severe headaches, vision changes, nausea, vomiting, weakness, coordination problems, speech problems, facial droop, or any other neurologic deficit.  She denies recent fevers, chills, neck pain, neck stiffness, or any traumatic injuries.  She reports having a dry cough recently that has resolved.  She reports that she was feeling sick last week and had rhinorrhea, congestion, cough, and decreased oral intake over several days.  She reports that the URI has resolved.  She also reports that during her stay in the emergency department, the tingling in her right arm and right leg have completely resolved and her tingling her head is nearly resolved.  She reports that she can barely feel  it currently.  Patient does report that she had slightly increased urine amount today.  Of note, patient does report that she started a new diet recently.  On exam, patient had no focal neurologic deficits.  Patient had no tingling in  her extremities or face.  Normal sensation and strength.  Normal coordination.  Normal gait.  Normal extraocular movements and pupil exam.  Normal speech patterns.  Lungs clear and chest nontender.  Normal neck range of motion with no stiffness or rigidity.  Completely unremarkable physical exam.  Patient had workup including laboratory testing   Laboratory testing showed negative pregnancy test, CBC was reassuring.  Metabolic panel showed hyperglycemia but otherwise reassuring.  Urinalysis showed ketones which may reflect dehydration in the setting of her recent URI and decreased oral intake.  Patient did have leukocytes and bacteria, in the setting of her increased urination, suspect she may have UTI.  Urinary tract infection may also contribute to her hyperglycemia.  EKG showed no STEMI.  Patient will be treated with fosfomycin 1 dose for her UTI.  As patient has continued to have improving symptoms, do not feel patient requires admission at this time for her intermittent paresthesia that has resolved.  Based on history and exam, do not feel patient had stroke or TIA.  Also considered seizure given her history of epilepsy as cause of symptoms however patient reports her previous seizures were grand mal.  Complicated migraine without headache considered.  Do not feel patient has ongoing seizure.  Do not feel patient has meningitis or encephalitis causing her symptoms given the reassuring exam.  After long discussion with patient, it was felt the patient was safe for discharge home with outpatient follow-up with PCP and strict return precautions.  Patient had no other questions or concerns and was discharged in good condition with resolution of presenting  symptoms.   Final Clinical Impressions(s) / ED Diagnoses   Final diagnoses:  Acute cystitis without hematuria  Paresthesias  Dehydration    ED Discharge Orders    None      Clinical Impression: 1. Acute cystitis without hematuria   2. Paresthesias   3. Dehydration     Disposition: Discharge  Condition: Good  I have discussed the results, Dx and Tx plan with the pt(& family if present). He/she/they expressed understanding and agree(s) with the plan. Discharge instructions discussed at great length. Strict return precautions discussed and pt &/or family have verbalized understanding of the instructions. No further questions at time of discharge.    New Prescriptions   No medications on file    Follow Up: Plotnikov, Evie Lacks, MD [place] [id number] [phone]     [facility] [id number] [place] [phone]  If symptoms worsen     Daren Doswell, Gwenyth Allegra, MD 04/26/17 [phone]

## 2017-04-26 NOTE — Discharge Instructions (Signed)
Your history and physical exam today was consistent with intermittent paresthesias which was the tingling sensation you felt.  You had reassuring lab testing today aside from the urinary tract infection we discovered.  As this may have contributed to your symptoms, you were treated with antibiotics.  It is also possible that your symptoms were impacted by your recent diet changes and dehydration.  Please stay hydrated.  Based on your exam and description of symptoms, we did not feel you are having a stroke.  It is unlikely that you have a change in your remote seizure history causing your symptoms however if you develop any new or worsened symptoms, please return immediately to the nearest emergency department.

## 2017-04-27 ENCOUNTER — Telehealth: Payer: Self-pay | Admitting: *Deleted

## 2017-04-27 NOTE — Telephone Encounter (Signed)
Tried calling pt to get hosp f/u appt made no answer LMOM RTC..( sent CRM to [facility] for FYI).....Molly Quinn

## 2017-04-30 NOTE — Telephone Encounter (Signed)
Called pt to verify hosp appt that was schedule for 05/01/17. Pt states yes she called back after receiving my msg on Friday. Inform pt need to ask some additional questions concerning discharge completed TCM call below.Johny Chess  Transition Care Management Follow-up Telephone Call   Date discharged? 04/26/17   How have you been since you were released from the hospital? Pt states she is doing fine   Do you understand why you were in the hospital? YES   Do you understand the discharge instructions? YES   Where were you discharged to? Home   Items Reviewed:  Medications reviewed: YES  Allergies reviewed: YES  Dietary changes reviewed: YES  Referrals reviewed: No referral needed   Functional Questionnaire:   Activities of Daily Living (ADLs):   She states she are independent in the following: ambulation, bathing and hygiene, feeding, continence, grooming, toileting and dressing States she doesn't require assistance    Any transportation issues/concerns?: NO   Any patient concerns? NO   Confirmed importance and date/time of follow-up visits scheduled YES, appt 05/01/17  Provider Appointment booked with Dr. Luther Redo  Confirmed with patient if condition begins to worsen call PCP or go to the ER.  Patient was given the office number and encouraged to call back with question or concerns.  : YES

## 2017-05-01 ENCOUNTER — Encounter: Payer: Self-pay | Admitting: Internal Medicine

## 2017-05-01 ENCOUNTER — Ambulatory Visit (INDEPENDENT_AMBULATORY_CARE_PROVIDER_SITE_OTHER): Payer: BLUE CROSS/BLUE SHIELD | Admitting: Internal Medicine

## 2017-05-01 VITALS — BP 124/78 | HR 111 | Temp 99.4°F | Ht 63.0 in | Wt 193.0 lb

## 2017-05-01 DIAGNOSIS — R739 Hyperglycemia, unspecified: Secondary | ICD-10-CM | POA: Diagnosis not present

## 2017-05-01 DIAGNOSIS — R202 Paresthesia of skin: Secondary | ICD-10-CM | POA: Insufficient documentation

## 2017-05-01 DIAGNOSIS — J069 Acute upper respiratory infection, unspecified: Secondary | ICD-10-CM

## 2017-05-01 DIAGNOSIS — G4489 Other headache syndrome: Secondary | ICD-10-CM | POA: Diagnosis not present

## 2017-05-01 DIAGNOSIS — G40802 Other epilepsy, not intractable, without status epilepticus: Secondary | ICD-10-CM | POA: Diagnosis not present

## 2017-05-01 LAB — GLUCOSE, POCT (MANUAL RESULT ENTRY): POC Glucose: 78 mg/dL (ref 70–99)

## 2017-05-01 LAB — POCT GLYCOSYLATED HEMOGLOBIN (HGB A1C): Hemoglobin A1C: 5.1

## 2017-05-01 MED ORDER — FLUCONAZOLE 150 MG PO TABS: 150.0000 mg | ORAL_TABLET | Freq: Once | ORAL | 1 refills | Status: AC

## 2017-05-01 MED ORDER — CEFDINIR 300 MG PO CAPS: 300.0000 mg | ORAL_CAPSULE | Freq: Two times a day (BID) | ORAL | 0 refills | Status: DC

## 2017-05-01 MED ORDER — ASPIRIN EC 81 MG PO TBEC: 81.0000 mg | DELAYED_RELEASE_TABLET | Freq: Every day | ORAL | 3 refills | Status: AC

## 2017-05-01 NOTE — Progress Notes (Signed)
Subjective:  Patient ID: Molly Quinn, female    DOB: Jun 09, 1975  Age: 42 y.o. MRN: [id number]  CC: No chief complaint on file.   HPI Molly Quinn presents for R sided body numbness episode in the right 1/2 of her body on 2/13.19 - ER visit notes reviewed.  Her Neurologist - is Dr Joesph July -next appointment is standing in early April.  She has not had a brain scan done over 20 years.  She denies history of migraine headaches.  No history of TIA or strokes.  Her blood sugar checked out high in the emergency room.  She is worried about diabetes in the view of her family history of diabetes.  She was somewhat regular diet.  She has been working 60 hours/week. C/o URI yellow nasal d/c and sputum x1.5 weeks F/u ?UTI - abn UA  Outpatient Medications Prior to Visit  Medication Sig Dispense Refill  . ALPRAZolam (XANAX) 0.5 MG tablet Take 1-2 tablets (0.5-1 mg total) 2 (two) times daily as needed by mouth for anxiety (for flight anxiety). 30 tablet 1  . cholecalciferol (VITAMIN D) 1000 units tablet Take 1,000 Units by mouth daily.    Marland Kitchen ibuprofen (ADVIL,MOTRIN) 200 MG tablet Take 400 mg by mouth every 6 (six) hours as needed for moderate pain.     Marland Kitchen LAMICTAL XR 100 MG TB24 24 hour tablet Take 300 mg by mouth at bedtime.  0  . omeprazole (PRILOSEC) 40 MG capsule Take 40 mg by mouth 2 (two) times daily.  0  . amoxicillin-clavulanate (AUGMENTIN) 875-125 MG tablet Take 1 tablet by mouth 2 (two) times daily. (Patient not taking: Reported on 04/26/2017) 20 tablet 0  . triamcinolone (NASACORT) 55 MCG/ACT AERO nasal inhaler Place 2 sprays into the nose daily. 1 Inhaler 12   No facility-administered medications prior to visit.     ROS Review of Systems  Constitutional: Negative for activity change, appetite change, chills, fatigue and unexpected weight change.  HENT: Positive for congestion, rhinorrhea, sinus pressure and sinus pain. Negative for mouth sores.   Eyes: Negative for visual disturbance.    Respiratory: Positive for cough. Negative for chest tightness.   Gastrointestinal: Negative for abdominal pain and nausea.  Genitourinary: Negative for difficulty urinating, frequency and vaginal pain.  Musculoskeletal: Negative for back pain and gait problem.  Skin: Negative for pallor and rash.  Neurological: Positive for numbness. Negative for dizziness, tremors, weakness and headaches.  Psychiatric/Behavioral: Negative for confusion and sleep disturbance.    Objective:  BP 124/78 (BP Location: Left Arm, Patient Position: Sitting, Cuff Size: Large)   Pulse (!) 111   Temp 99.4 F (37.4 C) (Oral)   Ht 5\' 3"  (1.6 m)   Wt 193 lb (87.5 kg)   LMP 04/25/2017   SpO2 99%   BMI 34.19 kg/m   BP Readings from Last 3 Encounters:  05/01/17 124/78  04/26/17 (!) 144/96  02/09/17 130/68    Wt Readings from Last 3 Encounters:  05/01/17 193 lb (87.5 kg)  04/25/17 192 lb (87.1 kg)  02/09/17 210 lb (95.3 kg)    Physical Exam  Constitutional: She appears well-developed. No distress.  HENT:  Head: Normocephalic.  Right Ear: External ear normal.  Left Ear: External ear normal.  Nose: Nose normal.  Mouth/Throat: Oropharynx is clear and moist.  Eyes: Conjunctivae are normal. Pupils are equal, round, and reactive to light. Right eye exhibits no discharge. Left eye exhibits no discharge.  Neck: Normal range of motion. Neck supple. No  JVD present. No tracheal deviation present. No thyromegaly present.  Cardiovascular: Normal rate, regular rhythm and normal heart sounds.  Pulmonary/Chest: No stridor. No respiratory distress. She has no wheezes.  Abdominal: Soft. Bowel sounds are normal. She exhibits no distension and no mass. There is no tenderness. There is no rebound and no guarding.  Musculoskeletal: She exhibits no edema or tenderness.  Lymphadenopathy:    She has no cervical adenopathy.  Neurological: She displays normal reflexes. No cranial nerve deficit. She exhibits normal muscle  tone. Coordination normal.  Skin: No rash noted. No erythema.  Psychiatric: She has a normal mood and affect. Her behavior is normal. Judgment and thought content normal.  Neuro exam is nonfocal.  There is no pronator drift.  There is no ataxia.  Normal muscle strength, coordination, deep tendon reflexes.  Pupils are reactive, eyes with full range of motion.  Lab Results  Component Value Date   WBC 7.1 04/25/2017   HGB 14.2 04/25/2017   HCT 39.8 04/25/2017   PLT 229 04/25/2017   GLUCOSE 184 (H) 04/25/2017   CHOL 207 (H) 02/09/2017   TRIG 123.0 02/09/2017   HDL 38.20 (L) 02/09/2017   LDLCALC 144 (H) 02/09/2017   ALT 28 02/09/2017   AST 20 02/09/2017   NA 137 04/25/2017   K 3.5 04/25/2017   CL 101 04/25/2017   CREATININE 0.80 04/25/2017   BUN 11 04/25/2017   CO2 24 04/25/2017   TSH 2.03 02/09/2017   HGBA1C 5.1 08/26/2012    No results found.  Assessment & Plan:   There are no diagnoses linked to this encounter. I have discontinued Marsh Dolly. Kulick's triamcinolone and amoxicillin-clavulanate. I am also having her maintain her cholecalciferol, ibuprofen, ALPRAZolam, omeprazole, and LAMICTAL XR.  No orders of the defined types were placed in this encounter.    Follow-up: No Follow-up on file.  [name], MD

## 2017-05-01 NOTE — Assessment & Plan Note (Signed)
Probable migraine headaches.  Ibuprofen as needed.

## 2017-05-01 NOTE — Assessment & Plan Note (Signed)
Follow-up with Dr. Joesph July in early April Continue with current therapy

## 2017-05-01 NOTE — Assessment & Plan Note (Signed)
Molly Quinn's hemoglobin A1c and blood glucose were normal today.  She will continue with low-carb diet, exercise, stress reduction.  Follow-up with me in 3-4 months with another set of blood work.  Prediabetes issues discussed.

## 2017-05-01 NOTE — Assessment & Plan Note (Addendum)
2/19 right bloody path paresthesia of unclear etiology.  Status post ER visit.  The symptoms have resolved completely.  I suggested getting a brain MRI scan however Molly Quinn would like to follow-up with Dr. Joesph July, her neurologist, first.  I asked Molly Quinn to take a baby aspirin a day in the meantime.  She will try to reduce her current workload of 60 hours/week to last hours.

## 2017-05-01 NOTE — Assessment & Plan Note (Addendum)
Acute sinusitis.  Cefdinir 300 mg twice a day for 10 days.

## 2017-05-01 NOTE — Patient Instructions (Signed)
Mediterranean Diet A Mediterranean diet refers to food and lifestyle choices that are based on the traditions of countries located on the [place]. This way of eating has been shown to help prevent certain conditions and improve outcomes for people who have chronic diseases, like kidney disease and heart disease. What are tips for following this plan? Lifestyle  Cook and eat meals together with your family, when possible.  Drink enough fluid to keep your urine clear or pale yellow.  Be physically active every day. This includes: ? Aerobic exercise like running or swimming. ? Leisure activities like gardening, walking, or housework.  Get 7-8 hours of sleep each night.  If recommended by your health care provider, drink red wine in moderation. This means 1 glass a day for nonpregnant women and 2 glasses a day for men. A glass of wine equals 5 oz (150 mL). Reading food labels  Check the serving size of packaged foods. For foods such as rice and pasta, the serving size refers to the amount of cooked product, not dry.  Check the total fat in packaged foods. Avoid foods that have saturated fat or trans fats.  Check the ingredients list for added sugars, such as corn syrup. Shopping  At the grocery store, buy most of your food from the areas near the walls of the store. This includes: ? Fresh fruits and vegetables (produce). ? Grains, beans, nuts, and seeds. Some of these may be available in unpackaged forms or large amounts (in bulk). ? Fresh seafood. ? Poultry and eggs. ? Low-fat dairy products.  Buy whole ingredients instead of prepackaged foods.  Buy fresh fruits and vegetables in-season from local farmers markets.  Buy frozen fruits and vegetables in resealable bags.  If you do not have access to quality fresh seafood, buy precooked frozen shrimp or canned fish, such as tuna, salmon, or sardines.  Buy small amounts of raw or cooked vegetables, salads, or olives from the  deli or salad bar at your store.  Stock your pantry so you always have certain foods on hand, such as olive oil, canned tuna, canned tomatoes, rice, pasta, and beans. Cooking  Cook foods with extra-virgin olive oil instead of using butter or other vegetable oils.  Have meat as a side dish, and have vegetables or grains as your main dish. This means having meat in small portions or adding small amounts of meat to foods like pasta or stew.  Use beans or vegetables instead of meat in common dishes like chili or lasagna.  Experiment with different cooking methods. Try roasting or broiling vegetables instead of steaming or sauteing them.  Add frozen vegetables to soups, stews, pasta, or rice.  Add nuts or seeds for added healthy fat at each meal. You can add these to yogurt, salads, or vegetable dishes.  Marinate fish or vegetables using olive oil, lemon juice, garlic, and fresh herbs. Meal planning  Plan to eat 1 vegetarian meal one day each week. Try to work up to 2 vegetarian meals, if possible.  Eat seafood 2 or more times a week.  Have healthy snacks readily available, such as: ? Vegetable sticks with hummus. ? Greek yogurt. ? Fruit and nut trail mix.  Eat balanced meals throughout the week. This includes: ? Fruit: 2-3 servings a day ? Vegetables: 4-5 servings a day ? Low-fat dairy: 2 servings a day ? Fish, poultry, or lean meat: 1 serving a day ? Beans and legumes: 2 or more servings a week ? Nuts   and seeds: 1-2 servings a day ? Whole grains: 6-8 servings a day ? Extra-virgin olive oil: 3-4 servings a day  Limit red meat and sweets to only a few servings a month What are my food choices?  Mediterranean diet ? Recommended ? Grains: Whole-grain pasta. Brown rice. Bulgar wheat. Polenta. Couscous. Whole-wheat bread. Oatmeal. Quinoa. ? Vegetables: Artichokes. Beets. Broccoli. Cabbage. Carrots. Eggplant. Green beans. Chard. Kale. Spinach. Onions. Leeks. Peas. Squash.  Tomatoes. Peppers. Radishes. ? Fruits: Apples. Apricots. Avocado. Berries. Bananas. Cherries. Dates. Figs. Grapes. Lemons. Melon. Oranges. Peaches. Plums. Pomegranate. ? Meats and other protein foods: Beans. Almonds. Sunflower seeds. Pine nuts. Peanuts. Cod. Salmon. Scallops. Shrimp. Tuna. Tilapia. Clams. Oysters. Eggs. ? Dairy: Low-fat milk. Cheese. Greek yogurt. ? Beverages: Water. Red wine. Herbal tea. ? Fats and oils: Extra virgin olive oil. Avocado oil. Grape seed oil. ? Sweets and desserts: Greek yogurt with honey. Baked apples. Poached pears. Trail mix. ? Seasoning and other foods: Basil. Cilantro. Coriander. Cumin. Mint. Parsley. Sage. Rosemary. Tarragon. Garlic. Oregano. Thyme. Pepper. Balsalmic vinegar. Tahini. Hummus. Tomato sauce. Olives. Mushrooms. ? Limit these ? Grains: Prepackaged pasta or rice dishes. Prepackaged cereal with added sugar. ? Vegetables: Deep fried potatoes (french fries). ? Fruits: Fruit canned in syrup. ? Meats and other protein foods: Beef. Pork. Lamb. Poultry with skin. Hot dogs. Bacon. ? Dairy: Ice cream. Sour cream. Whole milk. ? Beverages: Juice. Sugar-sweetened soft drinks. Beer. Liquor and spirits. ? Fats and oils: Butter. Canola oil. Vegetable oil. Beef fat (tallow). Lard. ? Sweets and desserts: Cookies. Cakes. Pies. Candy. ? Seasoning and other foods: Mayonnaise. Premade sauces and marinades. ? The items listed may not be a complete list. Talk with your dietitian about what dietary choices are right for you. Summary  The Mediterranean diet includes both food and lifestyle choices.  Eat a variety of fresh fruits and vegetables, beans, nuts, seeds, and whole grains.  Limit the amount of red meat and sweets that you eat.  Talk with your health care provider about whether it is safe for you to drink red wine in moderation. This means 1 glass a day for nonpregnant women and 2 glasses a day for men. A glass of wine equals 5 oz (150 mL). This information  is not intended to replace advice given to you by your health care provider. Make sure you discuss any questions you have with your health care provider. Document Released: 10/21/2015 Document Revised: 11/23/2015 Document Reviewed: 10/21/2015 Elsevier Interactive Patient Education  2018 Elsevier Inc.  

## 2017-05-24 DIAGNOSIS — R0982 Postnasal drip: Secondary | ICD-10-CM | POA: Diagnosis not present

## 2017-05-24 DIAGNOSIS — K219 Gastro-esophageal reflux disease without esophagitis: Secondary | ICD-10-CM | POA: Diagnosis not present

## 2017-06-07 ENCOUNTER — Inpatient Hospital Stay: Payer: BLUE CROSS/BLUE SHIELD | Attending: Oncology

## 2017-06-08 LAB — FERRITIN: FERRITIN: 39 ng/mL (ref 9–269)

## 2017-06-08 LAB — AFP TUMOR MARKER: AFP, Serum, Tumor Marker: 4.4 ng/mL (ref 0.0–8.3)

## 2017-06-13 ENCOUNTER — Other Ambulatory Visit: Payer: Self-pay | Admitting: Obstetrics and Gynecology

## 2017-06-13 DIAGNOSIS — Z139 Encounter for screening, unspecified: Secondary | ICD-10-CM

## 2017-06-15 ENCOUNTER — Ambulatory Visit (INDEPENDENT_AMBULATORY_CARE_PROVIDER_SITE_OTHER): Payer: BLUE CROSS/BLUE SHIELD | Admitting: Psychology

## 2017-06-15 DIAGNOSIS — F331 Major depressive disorder, recurrent, moderate: Secondary | ICD-10-CM | POA: Diagnosis not present

## 2017-06-27 ENCOUNTER — Ambulatory Visit (INDEPENDENT_AMBULATORY_CARE_PROVIDER_SITE_OTHER): Payer: BLUE CROSS/BLUE SHIELD | Admitting: Psychology

## 2017-06-27 DIAGNOSIS — F331 Major depressive disorder, recurrent, moderate: Secondary | ICD-10-CM

## 2017-07-13 ENCOUNTER — Ambulatory Visit: Payer: BLUE CROSS/BLUE SHIELD | Admitting: Psychology

## 2017-07-27 ENCOUNTER — Ambulatory Visit (INDEPENDENT_AMBULATORY_CARE_PROVIDER_SITE_OTHER): Payer: BLUE CROSS/BLUE SHIELD | Admitting: Psychology

## 2017-07-27 DIAGNOSIS — F331 Major depressive disorder, recurrent, moderate: Secondary | ICD-10-CM | POA: Diagnosis not present

## 2017-08-08 DIAGNOSIS — Z8041 Family history of malignant neoplasm of ovary: Secondary | ICD-10-CM | POA: Diagnosis not present

## 2017-08-08 DIAGNOSIS — Z6833 Body mass index (BMI) 33.0-33.9, adult: Secondary | ICD-10-CM | POA: Diagnosis not present

## 2017-08-08 DIAGNOSIS — Z1231 Encounter for screening mammogram for malignant neoplasm of breast: Secondary | ICD-10-CM | POA: Diagnosis not present

## 2017-08-08 DIAGNOSIS — Z113 Encounter for screening for infections with a predominantly sexual mode of transmission: Secondary | ICD-10-CM | POA: Diagnosis not present

## 2017-08-08 DIAGNOSIS — Z800 Family history of malignant neoplasm of digestive organs: Secondary | ICD-10-CM | POA: Diagnosis not present

## 2017-08-08 DIAGNOSIS — Z803 Family history of malignant neoplasm of breast: Secondary | ICD-10-CM | POA: Diagnosis not present

## 2017-08-08 DIAGNOSIS — Z808 Family history of malignant neoplasm of other organs or systems: Secondary | ICD-10-CM | POA: Diagnosis not present

## 2017-08-08 DIAGNOSIS — Z124 Encounter for screening for malignant neoplasm of cervix: Secondary | ICD-10-CM | POA: Diagnosis not present

## 2017-08-08 DIAGNOSIS — Z01419 Encounter for gynecological examination (general) (routine) without abnormal findings: Secondary | ICD-10-CM | POA: Diagnosis not present

## 2017-08-10 ENCOUNTER — Ambulatory Visit (INDEPENDENT_AMBULATORY_CARE_PROVIDER_SITE_OTHER): Payer: BLUE CROSS/BLUE SHIELD | Admitting: Psychology

## 2017-08-10 DIAGNOSIS — F331 Major depressive disorder, recurrent, moderate: Secondary | ICD-10-CM

## 2017-08-22 DIAGNOSIS — D1801 Hemangioma of skin and subcutaneous tissue: Secondary | ICD-10-CM | POA: Diagnosis not present

## 2017-08-22 DIAGNOSIS — D2261 Melanocytic nevi of right upper limb, including shoulder: Secondary | ICD-10-CM | POA: Diagnosis not present

## 2017-08-22 DIAGNOSIS — Z1283 Encounter for screening for malignant neoplasm of skin: Secondary | ICD-10-CM | POA: Diagnosis not present

## 2017-08-22 DIAGNOSIS — D485 Neoplasm of uncertain behavior of skin: Secondary | ICD-10-CM | POA: Diagnosis not present

## 2017-08-22 DIAGNOSIS — D225 Melanocytic nevi of trunk: Secondary | ICD-10-CM | POA: Diagnosis not present

## 2017-08-24 ENCOUNTER — Ambulatory Visit (INDEPENDENT_AMBULATORY_CARE_PROVIDER_SITE_OTHER): Payer: BLUE CROSS/BLUE SHIELD | Admitting: Psychology

## 2017-08-24 DIAGNOSIS — F331 Major depressive disorder, recurrent, moderate: Secondary | ICD-10-CM | POA: Diagnosis not present

## 2017-08-30 DIAGNOSIS — H903 Sensorineural hearing loss, bilateral: Secondary | ICD-10-CM | POA: Diagnosis not present

## 2017-08-30 DIAGNOSIS — K219 Gastro-esophageal reflux disease without esophagitis: Secondary | ICD-10-CM | POA: Diagnosis not present

## 2017-08-30 DIAGNOSIS — H9313 Tinnitus, bilateral: Secondary | ICD-10-CM | POA: Diagnosis not present

## 2017-08-30 DIAGNOSIS — H9319 Tinnitus, unspecified ear: Secondary | ICD-10-CM | POA: Diagnosis not present

## 2017-08-30 DIAGNOSIS — R0982 Postnasal drip: Secondary | ICD-10-CM | POA: Diagnosis not present

## 2017-09-01 DIAGNOSIS — F411 Generalized anxiety disorder: Secondary | ICD-10-CM | POA: Diagnosis not present

## 2017-09-07 ENCOUNTER — Ambulatory Visit (INDEPENDENT_AMBULATORY_CARE_PROVIDER_SITE_OTHER): Payer: BLUE CROSS/BLUE SHIELD | Admitting: Psychology

## 2017-09-07 DIAGNOSIS — F331 Major depressive disorder, recurrent, moderate: Secondary | ICD-10-CM

## 2017-09-28 ENCOUNTER — Ambulatory Visit (INDEPENDENT_AMBULATORY_CARE_PROVIDER_SITE_OTHER): Payer: BLUE CROSS/BLUE SHIELD | Admitting: Psychology

## 2017-09-28 DIAGNOSIS — F331 Major depressive disorder, recurrent, moderate: Secondary | ICD-10-CM

## 2017-10-05 DIAGNOSIS — Z6833 Body mass index (BMI) 33.0-33.9, adult: Secondary | ICD-10-CM | POA: Diagnosis not present

## 2017-10-05 DIAGNOSIS — Z7183 Encounter for nonprocreative genetic counseling: Secondary | ICD-10-CM | POA: Diagnosis not present

## 2017-10-12 ENCOUNTER — Ambulatory Visit (INDEPENDENT_AMBULATORY_CARE_PROVIDER_SITE_OTHER): Payer: BLUE CROSS/BLUE SHIELD | Admitting: Psychology

## 2017-10-12 DIAGNOSIS — F331 Major depressive disorder, recurrent, moderate: Secondary | ICD-10-CM | POA: Diagnosis not present

## 2017-10-26 ENCOUNTER — Ambulatory Visit (INDEPENDENT_AMBULATORY_CARE_PROVIDER_SITE_OTHER): Payer: BLUE CROSS/BLUE SHIELD | Admitting: Psychology

## 2017-10-26 DIAGNOSIS — F331 Major depressive disorder, recurrent, moderate: Secondary | ICD-10-CM | POA: Diagnosis not present

## 2017-11-09 ENCOUNTER — Ambulatory Visit: Payer: BLUE CROSS/BLUE SHIELD | Admitting: Psychology

## 2017-11-26 ENCOUNTER — Telehealth: Payer: Self-pay | Admitting: Oncology

## 2017-11-26 NOTE — Telephone Encounter (Signed)
G[facility] PAL - moved appointments to 10/7. Spoke with patient. Patient will call back after checking office schedule if not a good date/time.

## 2017-11-29 NOTE — Telephone Encounter (Signed)
Returned call to patient re rescheduling 10/7 lab/fu rescheduled from 9/26 due to GBS PAL. Per patient she is not able to come on 10/7 and will need to reschedule. Next available appointment is 10/21. Gave patient lab/fu for 10/21. Patient would like to know if she can come in for 9/30 and have her lab drawn. This message forwarded to GBS APP/desk nurse due to GBS out of office.

## 2017-11-29 NOTE — Telephone Encounter (Signed)
Yes, lab on 9/30 is fine. Thanks

## 2017-12-04 NOTE — Telephone Encounter (Signed)
Per response from LT ok for lab to be done 9/30. Moved lab from 10/21 to 9/30. Returned call to patient and left message re change and confirming date/time for lab 9/30 and f/u 10/21. Updated schedule mailed.

## 2017-12-06 ENCOUNTER — Ambulatory Visit: Payer: Self-pay | Admitting: Oncology

## 2017-12-06 ENCOUNTER — Other Ambulatory Visit: Payer: Self-pay

## 2017-12-10 ENCOUNTER — Inpatient Hospital Stay: Payer: BLUE CROSS/BLUE SHIELD | Attending: Oncology

## 2017-12-10 NOTE — Addendum Note (Signed)
Addended by: Tasia Catchings R on: 12/10/2017 03:06 PM   Modules accepted: Orders

## 2017-12-11 LAB — FERRITIN: FERRITIN: 27 ng/mL (ref 11–307)

## 2017-12-13 IMAGING — US US BREAST LTD UNI RIGHT INC AXILLA
1 series · 5 of 5 positions shown · non-contrast
Comparison: 05/18/2015

CLINICAL DATA: Possible masses bilateral breasts identified on
recent baseline screening mammogram.

EXAM:
DIGITAL DIAGNOSTIC BILATERAL MAMMOGRAM WITH 3D TOMOSYNTHESIS
ULTRASOUND BILATERAL BREAST

[Series 1: advbreast · 5 of 5 slices shown]
[im 1/5]
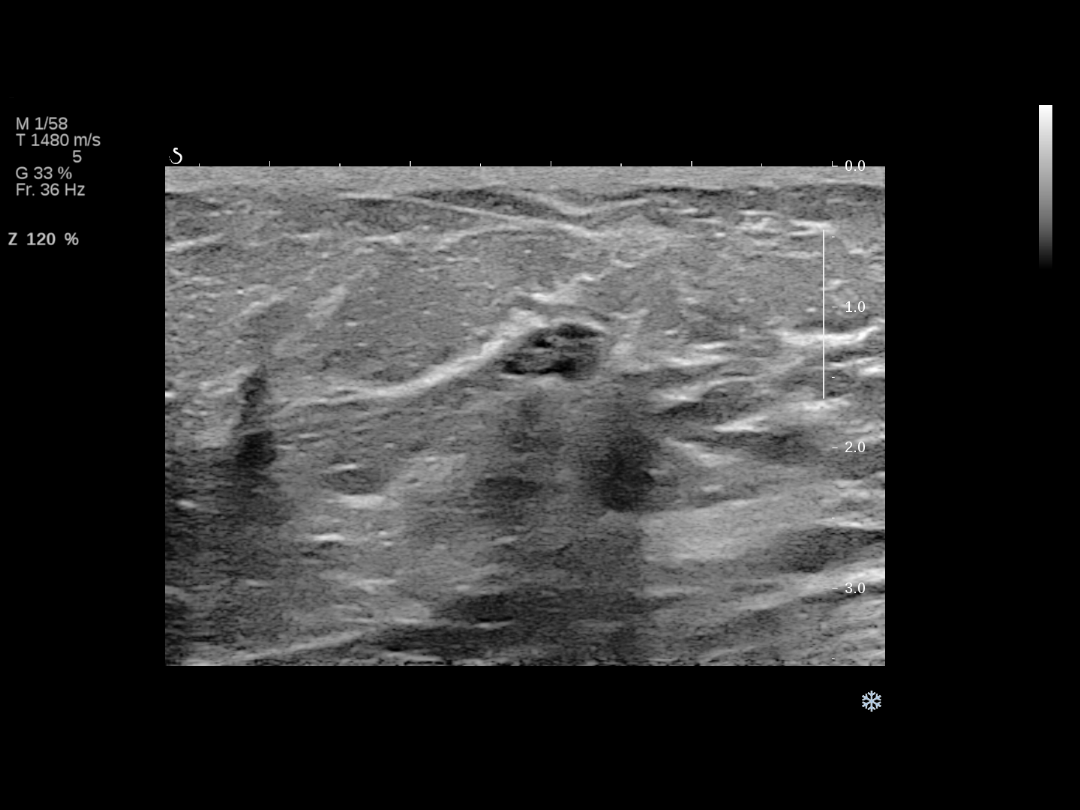
[im 2/5]
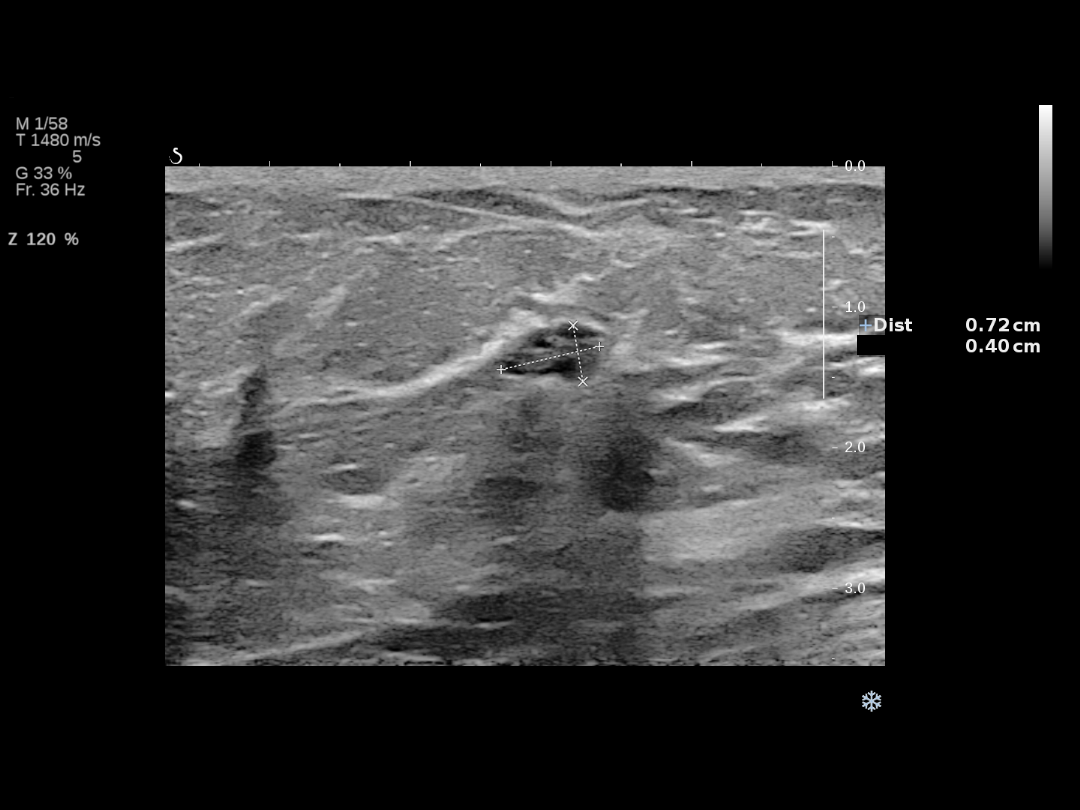
[im 3/5]
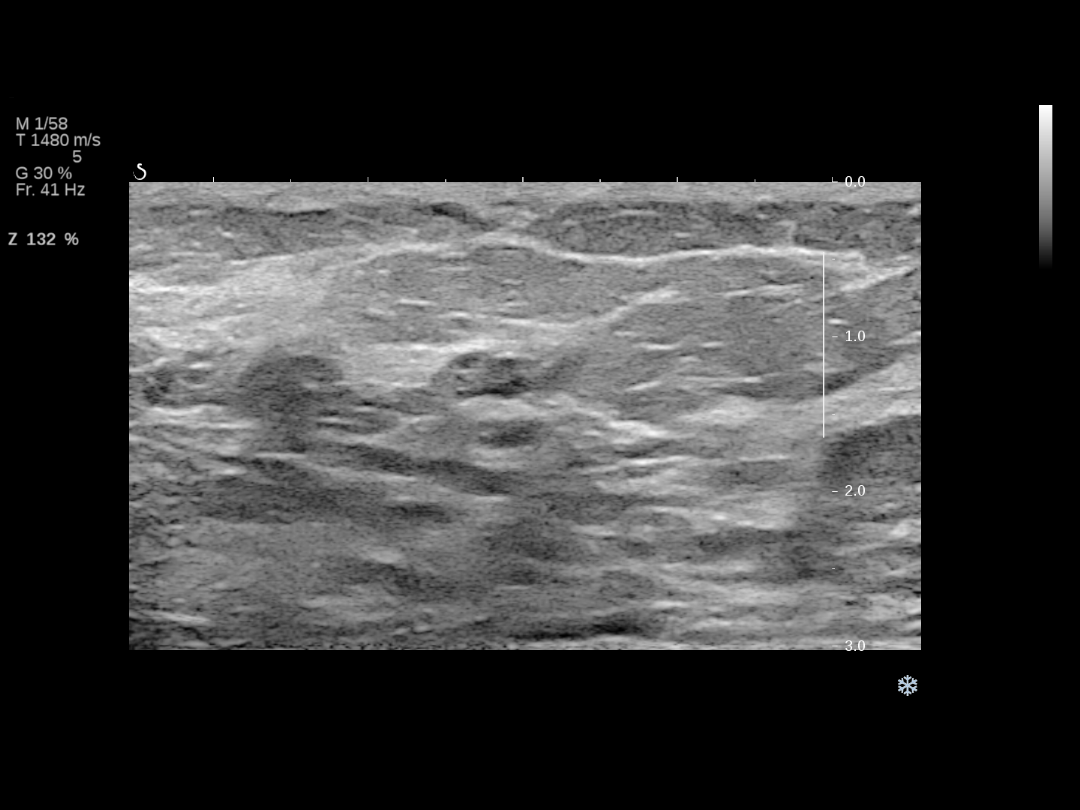
[im 4/5]
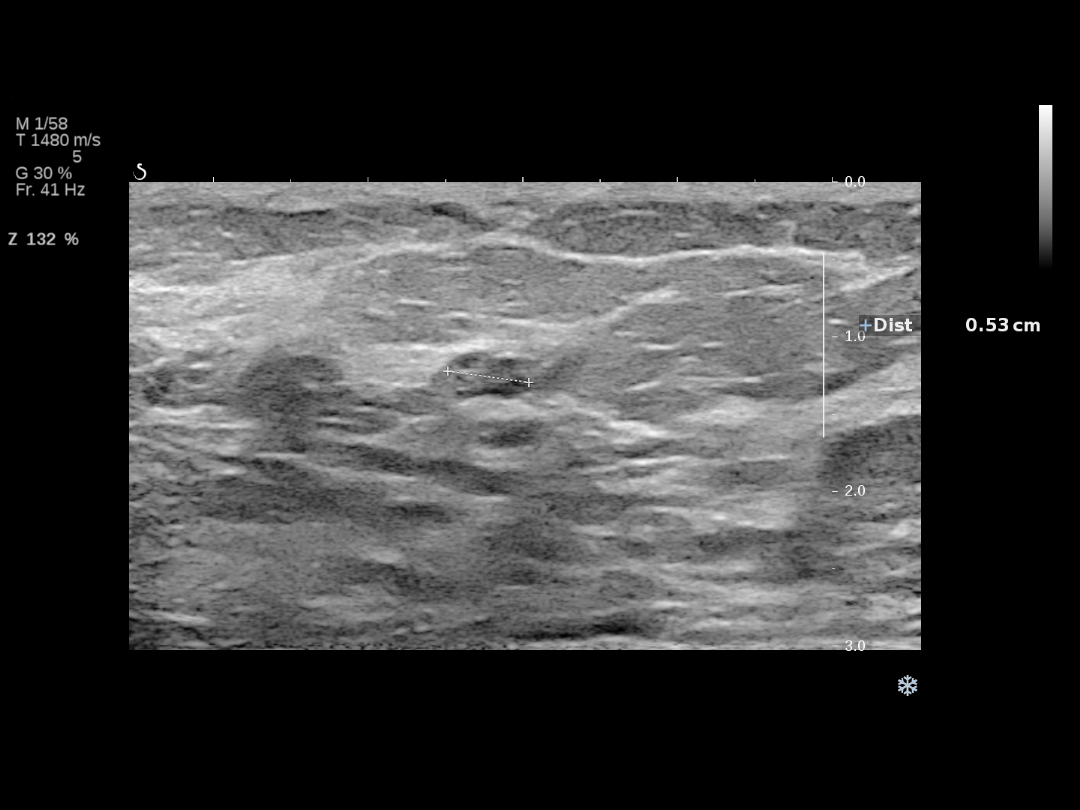
[im 5/5]
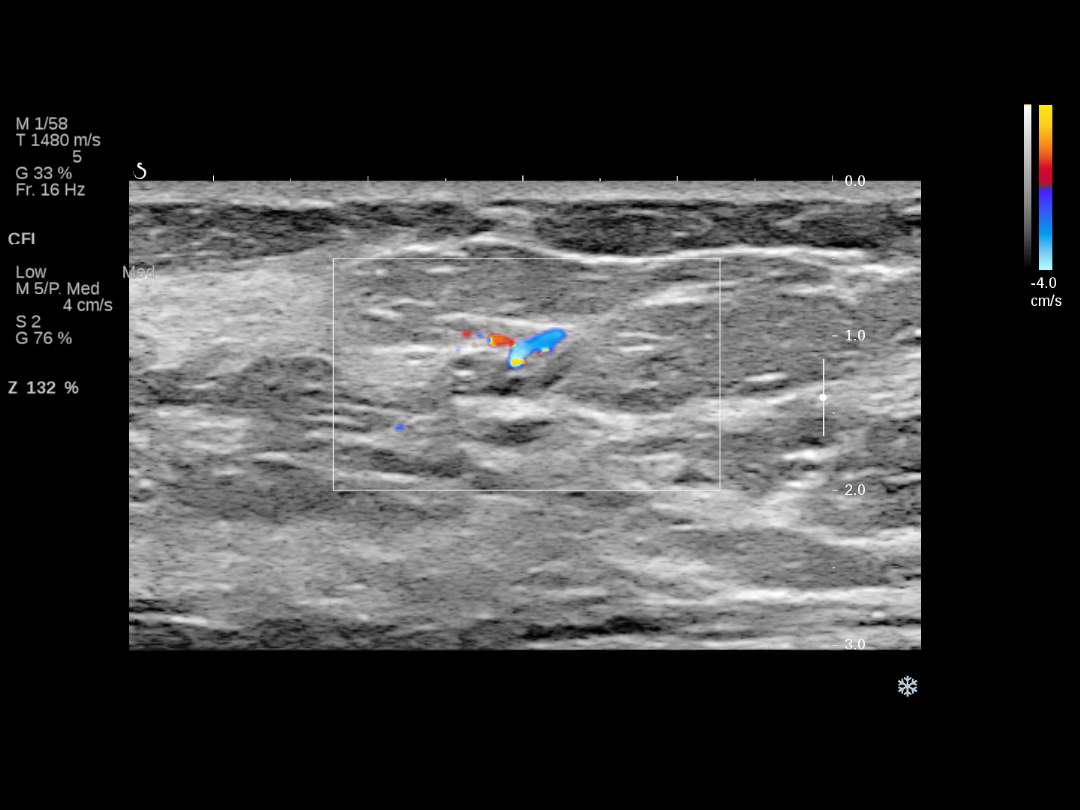

[5 of 5 positions shown; findings below may reference images not displayed]

ACR Breast Density Category c: The breast tissue is heterogeneously
dense, which may obscure small masses.
FINDINGS: Focal spot compression views of the upper central left breast show
heterogeneously dense breast parenchyma without persistent mass or
distortion. Findings on the recent mammogram are most consistent
with overlap of fibroglandular tissue.

Focal spot compression tomographic images of the right breast
confirm an approximately 7 mm circumscribed lobulated nodule in the
upper inner quadrant of the retroareolar right breast.

On physical exam, no mass is palpated in the retroareolar right
breast, upper inner quadrant or in the upper central left breast.

Targeted ultrasound is performed, showing a circumscribed oval mixed
echogenicity nodule in the right breast 1 o'clock position
retroareolar that measures 7 x 4 x 5 mm. This nodule contains
internal cystic spaces and some internal soft tissue density. There
is a vessel nearby the nodule, but no vascular flow is seen within
the nodule.

Ultrasound of the upper central left breast is negative.
IMPRESSION: Probably benign 7 mm nodule 1 o'clock right breast retroareolar.
Considerations include a complicated cyst or cluster of microcysts
vs fibroadenoma.

No evidence of malignancy in the left breast

RECOMMENDATION:
Ultrasound followup of the right breast is recommended in 6 months.
The option of biopsy was also discussed with the patient. She is in
agreement with ultrasound followup.

I have discussed the findings and recommendations with the patient.
Results were also provided in writing at the conclusion of the
visit. If applicable, a reminder letter will be sent to the patient
regarding the next appointment.

BI-RADS CATEGORY  3: Probably benign.

## 2017-12-14 ENCOUNTER — Ambulatory Visit (INDEPENDENT_AMBULATORY_CARE_PROVIDER_SITE_OTHER): Payer: BLUE CROSS/BLUE SHIELD | Admitting: Psychology

## 2017-12-14 DIAGNOSIS — F331 Major depressive disorder, recurrent, moderate: Secondary | ICD-10-CM | POA: Diagnosis not present

## 2017-12-17 ENCOUNTER — Other Ambulatory Visit: Payer: Self-pay

## 2017-12-17 ENCOUNTER — Ambulatory Visit: Payer: Self-pay | Admitting: Oncology

## 2017-12-17 DIAGNOSIS — R51 Headache: Secondary | ICD-10-CM | POA: Diagnosis not present

## 2017-12-17 DIAGNOSIS — G40B09 Juvenile myoclonic epilepsy, not intractable, without status epilepticus: Secondary | ICD-10-CM | POA: Diagnosis not present

## 2017-12-24 ENCOUNTER — Ambulatory Visit (INDEPENDENT_AMBULATORY_CARE_PROVIDER_SITE_OTHER): Payer: BLUE CROSS/BLUE SHIELD | Admitting: Family

## 2017-12-24 ENCOUNTER — Encounter: Payer: Self-pay | Admitting: Family

## 2017-12-24 VITALS — BP 128/78 | HR 67 | Temp 98.2°F | Ht 63.0 in | Wt 194.1 lb

## 2017-12-24 DIAGNOSIS — D1724 Benign lipomatous neoplasm of skin and subcutaneous tissue of left leg: Secondary | ICD-10-CM

## 2017-12-24 DIAGNOSIS — Z23 Encounter for immunization: Secondary | ICD-10-CM | POA: Diagnosis not present

## 2017-12-24 NOTE — Progress Notes (Signed)
Molly Quinn is a 42 y.o. female with the following history as recorded in [employer]:  Patient Active Problem List   Diagnosis Date Noted  . Paresthesia 05/01/2017  . Well adult exam 01/23/2017  . Depression 01/23/2017  . Palpitations 07/23/2015  . Nonallopathic lesion of cervical region 09/17/2014  . Nonallopathic lesion-rib cage 09/17/2014  . Nonallopathic lesion of thoracic region 09/17/2014  . Neck pain on right side 09/03/2014  . Right hand pain 09/03/2014  . Uterine fibroid 04/15/2014  . Abdominal pain, left lower quadrant 04/01/2014  . Cough 03/10/2014  . Allergic rhinitis 07/03/2013  . URI, acute 07/03/2013  . Fatigue 08/26/2012  . Headache 08/26/2012  . Hyperglycemia   . Irritable bowel syndrome 11/02/2008  . HYPERLIPIDEMIA 10/27/2008  . Anxiety attack 10/27/2008  . Epilepsy ([place]) 10/27/2008  . 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 ALPRAZolam (XANAX) 0.5 MG tablet Take 1-2 tablets (0.5-1 mg total) 2 (two) times daily as needed by mouth for anxiety (for flight anxiety). 30 tablet 1  . cholecalciferol (VITAMIN D) 1000 units tablet Take 1,000 Units by mouth daily.    Marland Kitchen DEXILANT 60 MG capsule   5  . ibuprofen (ADVIL,MOTRIN) 200 MG tablet Take 400 mg by mouth every 6 (six) hours as needed for moderate pain.     Marland Kitchen LAMICTAL XR 100 MG TB24 24 hour tablet Take 300 mg by mouth at bedtime.  0  . omeprazole (PRILOSEC) 40 MG capsule Take 40 mg by mouth 2 (two) times daily.  0  . TRINTELLIX 5 MG TABS tablet   1  . aspirin EC 81 MG tablet Take 1 tablet (81 mg total) by mouth daily. (Patient not taking: Reported on 12/24/2017) 100 tablet 3   No current facility-administered medications for this visit.     Allergies: Phenytoin and Sulfa antibiotics  Past Medical History:  Diagnosis Date  . Anxiety   . Epilepsy ([place]) dx 1991   no breakthru while on meds, last in 1994  . Epilepsy ([place])   . Hemochromatosis dx 03/2005 in  [place]   phlebotomy prn - 1x/yr or less, cbc q47mo  . Hyperlipidemia   . IBS (irritable bowel syndrome)     Past Surgical History:  Procedure Laterality Date  . BUNIONECTOMY    . CESAREAN SECTION  11/08/2010   Procedure: CESAREAN SECTION;  Surgeon: Daria Pastures;  Location: [facility];  Service: Gynecology;  Laterality: N/A;  . lymph node removal    . REPAIR ANKLE LIGAMENT    . TRANSTHORACIC ECHOCARDIOGRAM  February 2017   EF 60-65%. Normal wall thickness &  wall motion. No infiltrative disease. Normal diastolic pressures.    Family History  Problem Relation Age of Onset  . Ulcerative colitis Mother   . Hypertension Mother   . Hyperlipidemia Mother   . Hypertension Father   . Hyperlipidemia Father   . Melanoma Father 16  . Ovarian cancer Paternal Aunt   . Cancer Paternal Aunt        ovar ca  . Ulcerative colitis Brother        coloectomy age 40  . Diabetes Maternal Grandmother   . Heart disease Maternal Grandmother   . Hypertension Brother   . Breast cancer Paternal Grandmother   . Heart disease Maternal Uncle   . Prostate cancer Maternal Uncle   . Colon polyps Maternal Uncle   . Hemochromatosis Brother   . Other Brother  PSC  . Other Brother        bile duct cancer  . Colon cancer Neg Hx     Social History   Tobacco Use  . Smoking status: Never Smoker  . Smokeless tobacco: Never Used  Substance Use Topics  . Alcohol use: Yes    Comment: occasional    Subjective:  Vague historian; complaining of "lump" in her lower left leg; thinks she first noticed the area about one month ago; no pain/ no drainage; denies any pain at site; does not remember injuring the leg; area has not changed in size; does exercise regularly on the treadmill but has not fallen;   Objective:  Vitals:   12/24/17 1343  BP: 128/78  Pulse: 67  Temp: 98.2 F (36.8 C)  TempSrc: Oral  SpO2: 96%  Weight: 194 lb 1.3 oz (88 kg)  Height: 5\' 3"  (1.6 m)    General: Well developed, well  nourished, in no acute distress  Skin : Warm and dry. Firm, movable area noted on outer shin left leg Head: Normocephalic and atraumatic  Eyes: Sclera and conjunctiva clear; pupils round and reactive to light; extraocular movements intact  Ears: External normal; canals clear; tympanic membranes normal  Oropharynx: Pink, supple. No suspicious lesions  Neck: Supple without thyromegaly, adenopathy  Lungs: Respirations unlabored; clear to auscultation bilaterally without wheeze, rales, rhonchi  CVS exam: normal rate and regular rhythm.  Neurologic: Alert and oriented; speech intact; face symmetrical; moves all extremities well; CNII-XII intact without focal deficit   Assessment:  1. Lipoma of left lower extremity     Plan:  Reassurance; suspect lipoma; update ultrasound; follow-up to be determined.  Flu shot given as requested.     No follow-ups on file.  Orders Placed This Encounter  Procedures  . [place] LT LOWER EXTREM LTD SOFT TISSUE NON VASCULAR    Standing Status:   Future    Standing Expiration Date:   02/24/2019    Order Specific Question:   Reason for Exam (SYMPTOM  OR DIAGNOSIS REQUIRED)    Answer:   evaluate for lipoma    Order Specific Question:   Preferred imaging location?    Answer:   [facility]    Requested Prescriptions    No prescriptions requested or ordered in this encounter

## 2017-12-24 NOTE — Patient Instructions (Signed)
Lipoma A lipoma is a noncancerous (benign) tumor that is made up of fat cells. This is a very common type of soft-tissue growth. Lipomas are usually found under the skin (subcutaneous). They may occur in any tissue of the body that contains fat. Common areas for lipomas to appear include the back, shoulders, buttocks, and thighs. Lipomas grow slowly, and they are usually painless. Most lipomas do not cause problems and do not require treatment. What are the causes? The cause of this condition is not known. What increases the risk? This condition is more likely to develop in:  People who are 40-60 years old.  People who have a family history of lipomas.  What are the signs or symptoms? A lipoma usually appears as a small, round bump under the skin. It may feel soft or rubbery, but the firmness can vary. Most lipomas are not painful. However, a lipoma may become painful if it is located in an area where it pushes on nerves. How is this diagnosed? A lipoma can usually be diagnosed with a physical exam. You may also have tests to confirm the diagnosis and to rule out other conditions. Tests may include:  Imaging tests, such as a CT scan or MRI.  Removal of a tissue sample to be looked at under a microscope (biopsy).  How is this treated? Treatment is not needed for small lipomas that are not causing problems. If a lipoma continues to get bigger or it causes problems, removal is often the best option. Lipomas can also be removed to improve appearance. Removal of a lipoma is usually done with a surgery in which the fatty cells and the surrounding capsule are removed. Most often, a medicine that numbs the area (local anesthetic) is used for this procedure. Follow these instructions at home:  Keep all follow-up visits as directed by your health care provider. This is important. Contact a health care provider if:  Your lipoma becomes larger or hard.  Your lipoma becomes painful, red, or  increasingly swollen. These could be signs of infection or a more serious condition. This information is not intended to replace advice given to you by your health care provider. Make sure you discuss any questions you have with your health care provider. Document Released: 02/17/2002 Document Revised: 08/05/2015 Document Reviewed: 02/23/2014 Elsevier Interactive Patient Education  2018 Elsevier Inc.  

## 2017-12-24 NOTE — Addendum Note (Signed)
Addended by: Marcina Millard on: 12/24/2017 02:09 PM   Modules accepted: Orders

## 2017-12-28 ENCOUNTER — Ambulatory Visit (INDEPENDENT_AMBULATORY_CARE_PROVIDER_SITE_OTHER): Payer: BLUE CROSS/BLUE SHIELD | Admitting: Psychology

## 2017-12-28 DIAGNOSIS — F331 Major depressive disorder, recurrent, moderate: Secondary | ICD-10-CM | POA: Diagnosis not present

## 2017-12-31 ENCOUNTER — Telehealth: Payer: Self-pay | Admitting: Oncology

## 2017-12-31 ENCOUNTER — Other Ambulatory Visit: Payer: Self-pay

## 2017-12-31 ENCOUNTER — Inpatient Hospital Stay: Payer: BLUE CROSS/BLUE SHIELD | Attending: Oncology | Admitting: Oncology

## 2017-12-31 DIAGNOSIS — G40909 Epilepsy, unspecified, not intractable, without status epilepticus: Secondary | ICD-10-CM | POA: Insufficient documentation

## 2017-12-31 NOTE — Telephone Encounter (Signed)
Scheduled appt per 10/21 los - sent reminder letter in the mail with appt date and time.

## 2017-12-31 NOTE — Progress Notes (Signed)
  [facility] OFFICE PROGRESS NOTE   Diagnosis: Hemochromatosis  INTERVAL HISTORY:   Molly Quinn returns as scheduled.  She generally feels well.  Her appetite.  She is working.  She has a monthly menstrual cycle.  No other bleeding.  She reports intermittent "tingling "in the plantar surface of the lateral toes.  No other neuropathy symptoms.  No leg weakness or numbness.  No low back pain. She has noted a knot at the left lower leg for the past few weeks.  No known trauma.  Objective:  Vital signs in last 24 hours:  Blood pressure (!) 128/96, pulse 66, temperature 98.3 F (36.8 C), temperature source Oral, resp. rate 20, height 5\' 3"  (1.6 m), weight 192 lb 8 oz (87.3 kg), SpO2 99 %. Resp: Lungs clear bilaterally Cardio: Regular rate and rhythm GI: No hepatosplenomegaly Vascular: No leg edema Neuro: Sensation is intact to light touch at the feet, the motor exam is intact in the feet and legs bilaterally Skin: Medial to the left mid tibia there is a 1 cm firm lesion in the subcutaneous tissue, appears separate from the bone    Lab Results:  Lab Results  Component Value Date   WBC 7.1 04/25/2017   HGB 14.2 04/25/2017   HCT 39.8 04/25/2017   MCV 87.7 04/25/2017   PLT 229 04/25/2017   NEUTROABS 6.7 02/09/2017  Ferritin on 12/10/2017: 27  Medications: I have reviewed the patient's current medications.   Assessment/Plan: 1. Hereditary hemochromatosis: She last underwent phlebotomy therapy in February 2018 2. Seizure disorder: Maintained on Lamictal. 3. History of an abnormal liver ultrasound-negative abdomen CT 04/09/2014 4. Status post cesarean section delivery of a female baby in August 2012.     Disposition: Molly Quinn appears stable.  She last underwent a therapeutic phlebotomy in February 2018.  The ferritin has been in goal range on the last several determinations.  She denies bleeding other than a menstrual cycle.  She underwent a normal colonoscopy in  May 2016.  She will return for a ferritin level in 5 months.  She will be scheduled for a one-year office visit.  She will follow-up with her neurologist to evaluate the foot tingling.  She has seen her primary physician to evaluate the left leg lesion and is scheduled for imaging.  She will continue follow-up with her primary physician.  15 minutes were spent with the patient today.  The majority of the time was used for counseling and coordination of care.  Betsy Coder, MD  12/31/2017  1:05 PM

## 2018-01-01 ENCOUNTER — Ambulatory Visit
Admission: RE | Admit: 2018-01-01 | Discharge: 2018-01-01 | Disposition: A | Discharge status: Home / Self Care | Payer: BLUE CROSS/BLUE SHIELD | Source: Ambulatory Visit | Attending: Family | Admitting: Family

## 2018-01-01 DIAGNOSIS — S8012XA Contusion of left lower leg, initial encounter: Secondary | ICD-10-CM | POA: Diagnosis not present

## 2018-01-01 DIAGNOSIS — D1724 Benign lipomatous neoplasm of skin and subcutaneous tissue of left leg: Secondary | ICD-10-CM

## 2018-01-02 ENCOUNTER — Other Ambulatory Visit: Payer: Self-pay | Admitting: Family

## 2018-01-02 ENCOUNTER — Telehealth: Payer: Self-pay | Admitting: Family

## 2018-01-02 DIAGNOSIS — R5383 Other fatigue: Secondary | ICD-10-CM

## 2018-01-02 DIAGNOSIS — Z13220 Encounter for screening for lipoid disorders: Secondary | ICD-10-CM

## 2018-01-02 DIAGNOSIS — M79605 Pain in left leg: Secondary | ICD-10-CM

## 2018-01-02 NOTE — Telephone Encounter (Signed)
Please let her know I am doing a CT- not MRI due to insurance recommendations.

## 2018-01-02 NOTE — Telephone Encounter (Signed)
Info given 

## 2018-01-04 ENCOUNTER — Other Ambulatory Visit (INDEPENDENT_AMBULATORY_CARE_PROVIDER_SITE_OTHER): Payer: BLUE CROSS/BLUE SHIELD

## 2018-01-04 DIAGNOSIS — Z13220 Encounter for screening for lipoid disorders: Secondary | ICD-10-CM

## 2018-01-04 DIAGNOSIS — R5383 Other fatigue: Secondary | ICD-10-CM | POA: Diagnosis not present

## 2018-01-04 LAB — COMPREHENSIVE METABOLIC PANEL
ALT: 16 U/L (ref 0–35)
AST: 12 U/L (ref 0–37)
Albumin: 4.5 g/dL (ref 3.5–5.2)
Alkaline Phosphatase: 70 U/L (ref 39–117)
BUN: 12 mg/dL (ref 6–23)
CHLORIDE: 103 meq/L (ref 96–112)
CO2: 27 mEq/L (ref 19–32)
Calcium: 9.1 mg/dL (ref 8.4–10.5)
Creatinine, Ser: 0.91 mg/dL (ref 0.40–1.20)
GFR: 71.53 mL/min (ref >60.00)
GLUCOSE: 75 mg/dL (ref 70–99)
POTASSIUM: 4.1 meq/L (ref 3.5–5.1)
SODIUM: 137 meq/L (ref 135–145)
Total Bilirubin: 0.5 mg/dL (ref 0.2–1.2)
Total Protein: 6.7 g/dL (ref 6.0–8.3)

## 2018-01-04 LAB — CBC WITH DIFFERENTIAL/PLATELET
BASOS PCT: 0.3 % (ref 0.0–3.0)
Basophils Absolute: 0 10*3/uL (ref 0.0–0.1)
EOS PCT: 0 % (ref 0.0–5.0)
Eosinophils Absolute: 0 10*3/uL (ref 0.0–0.7)
HCT: 41.8 % (ref 36.0–46.0)
HEMOGLOBIN: 14.6 g/dL (ref 12.0–15.0)
LYMPHS ABS: 2.2 10*3/uL (ref 0.7–4.0)
Lymphocytes Relative: 26.3 % (ref 12.0–46.0)
MCHC: 34.8 g/dL (ref 30.0–36.0)
MCV: 90.1 fl (ref 78.0–100.0)
MONO ABS: 0.6 10*3/uL (ref 0.1–1.0)
Monocytes Relative: 7.6 % (ref 3.0–12.0)
NEUTROS ABS: 5.5 10*3/uL (ref 1.4–7.7)
Neutrophils Relative %: 65.8 % (ref 43.0–77.0)
Platelets: 274 10*3/uL (ref 150.0–400.0)
RBC: 4.64 Mil/uL (ref 3.87–5.11)
RDW: 13 % (ref 11.5–15.5)
WBC: 8.3 10*3/uL (ref 4.0–10.5)

## 2018-01-04 LAB — LIPID PANEL
Cholesterol: 185 mg/dL (ref 0–200)
HDL: 40.4 mg/dL (ref >39.00)
NONHDL: 145.02
Total CHOL/HDL Ratio: 5
Triglycerides: 201 mg/dL — ABNORMAL HIGH (ref 0.0–149.0)
VLDL: 40.2 mg/dL — AB (ref 0.0–40.0)

## 2018-01-04 LAB — LDL CHOLESTEROL, DIRECT: LDL DIRECT: 133 mg/dL

## 2018-01-04 LAB — TSH: TSH: 2.12 u[IU]/mL (ref 0.35–4.50)

## 2018-01-08 ENCOUNTER — Other Ambulatory Visit: Payer: Self-pay | Admitting: Family

## 2018-01-08 DIAGNOSIS — R5383 Other fatigue: Secondary | ICD-10-CM

## 2018-01-09 ENCOUNTER — Other Ambulatory Visit (INDEPENDENT_AMBULATORY_CARE_PROVIDER_SITE_OTHER): Payer: BLUE CROSS/BLUE SHIELD

## 2018-01-09 DIAGNOSIS — R5383 Other fatigue: Secondary | ICD-10-CM | POA: Diagnosis not present

## 2018-01-09 LAB — VITAMIN B12: Vitamin B-12: 526 pg/mL (ref 211–911)

## 2018-01-11 ENCOUNTER — Encounter: Payer: Self-pay | Admitting: Family

## 2018-01-11 ENCOUNTER — Ambulatory Visit (INDEPENDENT_AMBULATORY_CARE_PROVIDER_SITE_OTHER): Payer: BLUE CROSS/BLUE SHIELD | Admitting: Family

## 2018-01-11 ENCOUNTER — Other Ambulatory Visit: Payer: Self-pay | Admitting: Family

## 2018-01-11 ENCOUNTER — Other Ambulatory Visit (INDEPENDENT_AMBULATORY_CARE_PROVIDER_SITE_OTHER): Payer: BLUE CROSS/BLUE SHIELD

## 2018-01-11 ENCOUNTER — Ambulatory Visit (INDEPENDENT_AMBULATORY_CARE_PROVIDER_SITE_OTHER)
Admission: RE | Admit: 2018-01-11 | Discharge: 2018-01-11 | Disposition: A | Discharge status: Home / Self Care | Payer: BLUE CROSS/BLUE SHIELD | Source: Ambulatory Visit | Attending: Family | Admitting: Family

## 2018-01-11 ENCOUNTER — Ambulatory Visit: Payer: BLUE CROSS/BLUE SHIELD | Admitting: Psychology

## 2018-01-11 VITALS — BP 130/78 | HR 90 | Temp 97.8°F | Ht 63.0 in | Wt 190.0 lb

## 2018-01-11 DIAGNOSIS — R509 Fever, unspecified: Secondary | ICD-10-CM

## 2018-01-11 DIAGNOSIS — R079 Chest pain, unspecified: Secondary | ICD-10-CM | POA: Diagnosis not present

## 2018-01-11 DIAGNOSIS — R0781 Pleurodynia: Secondary | ICD-10-CM | POA: Diagnosis not present

## 2018-01-11 LAB — URINALYSIS, ROUTINE W REFLEX MICROSCOPIC
Bilirubin Urine: NEGATIVE
KETONES UR: 15 — AB
Leukocytes, UA: NEGATIVE
Nitrite: NEGATIVE
SPECIFIC GRAVITY, URINE: 1.01 (ref 1.000–1.030)
Total Protein, Urine: NEGATIVE
URINE GLUCOSE: NEGATIVE
UROBILINOGEN UA: 0.2 (ref 0.0–1.0)
WBC, UA: NONE SEEN (ref 0–?)
pH: 6 (ref 5.0–8.0)

## 2018-01-11 MED ORDER — AZITHROMYCIN 250 MG PO TABS: ORAL_TABLET | ORAL | 0 refills | Status: DC

## 2018-01-11 NOTE — Progress Notes (Signed)
Molly Quinn is a 42 y.o. female with the following history as recorded in Epic[employer]:  Patient Active Problem List   Diagnosis Date Noted  . Paresthesia 05/01/2017  . Well adult exam 01/23/2017  . Depression 01/23/2017  . Palpitations 07/23/2015  . Nonallopathic lesion of cervical region 09/17/2014  . Nonallopathic lesion-rib cage 09/17/2014  . Nonallopathic lesion of thoracic region 09/17/2014  . Neck pain on right side 09/03/2014  . Right hand pain 09/03/2014  . Uterine fibroid 04/15/2014  . Abdominal pain, left lower quadrant 04/01/2014  . Cough 03/10/2014  . Allergic rhinitis 07/03/2013  . URI, acute 07/03/2013  . Fatigue 08/26/2012  . Headache 08/26/2012  . Hyperglycemia   . Irritable bowel syndrome 11/02/2008  . HYPERLIPIDEMIA 10/27/2008  . Anxiety attack 10/27/2008  . Epilepsy ([place]) 10/27/2008  . 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 ALPRAZolam (XANAX) 0.5 MG tablet Take 1-2 tablets (0.5-1 mg total) 2 (two) times daily as needed by mouth for anxiety (for flight anxiety). 30 tablet 1  . cholecalciferol (VITAMIN D) 1000 units tablet Take 1,000 Units by mouth daily.    Marland Kitchen DEXILANT 60 MG capsule   5  . ibuprofen (ADVIL,MOTRIN) 200 MG tablet Take 400 mg by mouth every 6 (six) hours as needed for moderate pain.     Marland Kitchen LAMICTAL XR 100 MG TB24 24 hour tablet Take 300 mg by mouth at bedtime.  0  . aspirin EC 81 MG tablet Take 1 tablet (81 mg total) by mouth daily. (Patient not taking: Reported on 12/24/2017) 100 tablet 3  . azithromycin (ZITHROMAX) 250 MG tablet 2 tabs po qd x 1 day; 1 tablet per day x 4 days; 6 tablet 0   No current facility-administered medications for this visit.     Allergies: Phenytoin and Sulfa antibiotics  Past Medical History:  Diagnosis Date  . Anxiety   . Epilepsy ([place]) dx 1991   no breakthru while on meds, last in 1994  . Epilepsy ([place])   . Hemochromatosis dx 03/2005 in [place]   phlebotomy  prn - 1x/yr or less, cbc q10mo  . Hyperlipidemia   . IBS (irritable bowel syndrome)     Past Surgical History:  Procedure Laterality Date  . BUNIONECTOMY    . CESAREAN SECTION  11/08/2010   Procedure: CESAREAN SECTION;  Surgeon: Daria Pastures;  Location: [facility];  Service: Gynecology;  Laterality: N/A;  . lymph node removal    . REPAIR ANKLE LIGAMENT    . TRANSTHORACIC ECHOCARDIOGRAM  February 2017   EF 60-65%. Normal wall thickness &  wall motion. No infiltrative disease. Normal diastolic pressures.    Family History  Problem Relation Age of Onset  . Ulcerative colitis Mother   . Hypertension Mother   . Hyperlipidemia Mother   . Hypertension Father   . Hyperlipidemia Father   . Melanoma Father 44  . Ovarian cancer Paternal Aunt   . Cancer Paternal Aunt        ovar ca  . Ulcerative colitis Brother        coloectomy age 72  . Diabetes Maternal Grandmother   . Heart disease Maternal Grandmother   . Hypertension Brother   . Breast cancer Paternal Grandmother   . Heart disease Maternal Uncle   . Prostate cancer Maternal Uncle   . Colon polyps Maternal Uncle   . Hemochromatosis Brother   . Other Brother        [place]  .  Other Brother        bile duct cancer  . Colon cancer Neg Hx     Social History   Tobacco Use  . Smoking status: Never Smoker  . Smokeless tobacco: Never Used  Substance Use Topics  . Alcohol use: Yes    Comment: occasional    Subjective:  Patient presents with one week history of 4 day fever; notes that responds to Tyelnol but will come return; + headache; no burning with urination; no cough, congestion; no recent tick bites; "just feels achy all over." Fever as high as 102; has noticed some left sided flank pain recently;   LMP- on now   Objective:  Vitals:   01/11/18 1028  BP: 130/78  Pulse: 90  Temp: 97.8 F (36.6 C)  TempSrc: Oral  SpO2: 98%  Weight: 190 lb (86.2 kg)  Height: 5\' 3"  (1.6 m)    General: Well developed, well nourished,  in no acute distress  Skin : Warm and dry.  Head: Normocephalic and atraumatic  Eyes: Sclera and conjunctiva clear; pupils round and reactive to light; extraocular movements intact  Ears: External normal; canals clear; tympanic membranes normal  Oropharynx: Pink, supple. No suspicious lesions  Neck: Supple without thyromegaly, adenopathy  Lungs: Respirations unlabored; clear to auscultation bilaterally without wheeze, rales, rhonchi  CVS exam: normal rate and regular rhythm.  Neurologic: Alert and oriented; speech intact; face symmetrical; moves all extremities well; CNII-XII intact without focal deficit   Assessment:  1. Fever, unspecified fever cause   2. Rib pain     Plan:  Update CXR, U/A, CBC and CMP today; will also check left rib x-ray as patient has noticed a new "lump" there; she is scheduled for CT next week to evaluate the questionable lump on her lower left extremity; CBC and CMP done last week were normal;  ? Viral illness vs other process; follow-up to be determined.   No follow-ups on file.  Orders Placed This Encounter  Procedures  . Urine Culture    Standing Status:   Future    Number of Occurrences:   1    Standing Expiration Date:   01/11/2019  . DG Chest 2 View    Standing Status:   Future    Number of Occurrences:   1    Standing Expiration Date:   03/14/2019    Order Specific Question:   Reason for Exam (SYMPTOM  OR DIAGNOSIS REQUIRED)    Answer:   fever    Order Specific Question:   Is patient pregnant?    Answer:   No    Order Specific Question:   Preferred imaging location?    Answer:   [facility]    Order Specific Question:   Radiology Contrast Protocol - do NOT remove file path    Answer:   \\[email or web address]s.pdf  . DG Ribs Unilateral Left    Standing Status:   Future    Number of Occurrences:   1    Standing Expiration Date:   03/14/2019    Order Specific Question:   Reason for Exam (SYMPTOM  OR DIAGNOSIS  REQUIRED)    Answer:   rib pain    Order Specific Question:   Is patient pregnant?    Answer:   No    Order Specific Question:   Preferred imaging location?    Answer:   [facility]    Order Specific Question:   Radiology Contrast Protocol - do NOT remove file path  Answer:   \\[email or web address]s.pdf  . Urinalysis    Standing Status:   Future    Number of Occurrences:   1    Standing Expiration Date:   01/11/2019    Requested Prescriptions    No prescriptions requested or ordered in this encounter

## 2018-01-12 LAB — URINE CULTURE
MICRO NUMBER:: 91317237
SPECIMEN QUALITY: ADEQUATE

## 2018-01-14 NOTE — Telephone Encounter (Signed)
I am glad to hear the fever is finally broken- my suspicion is something flu-like caused her symptoms last week.  It looks like she has a neurologist. I would like her to contact them about headache concerns and getting an MRI done.   I will be in touch once I get the CT of her leg back.

## 2018-01-14 NOTE — Telephone Encounter (Signed)
Patient returned phone call. °

## 2018-01-14 NOTE — Telephone Encounter (Signed)
Please call and check on her; is she still running high fevers? If she is still having diarrhea with the Z-pak, she needs to stop the medication.

## 2018-01-14 NOTE — Telephone Encounter (Signed)
Copied from [place] [id number]. Topic: Quick Communication - See Telephone Encounter >> Jan 14, 2018  9:47 AM Ahmed Prima L wrote: CRM for notification. See Telephone encounter for: 01/14/18.  Patient called back and wanted to let Mickel Baas know that she had a fever all weekend & headache on left side, but is fever free today. She stayed in bed all day yesterday and tried to limit to taking tylenol once a day. She no longer has diarrhea. She states she would like a follow up with the neurologist or have a MRI of her brain. Patient would like a call back. She said she does have a lump on her back.

## 2018-01-15 ENCOUNTER — Ambulatory Visit (INDEPENDENT_AMBULATORY_CARE_PROVIDER_SITE_OTHER)
Admission: RE | Admit: 2018-01-15 | Discharge: 2018-01-15 | Disposition: A | Discharge status: Home / Self Care | Payer: BLUE CROSS/BLUE SHIELD | Source: Ambulatory Visit | Attending: Family | Admitting: Family

## 2018-01-15 DIAGNOSIS — M79605 Pain in left leg: Secondary | ICD-10-CM

## 2018-01-15 DIAGNOSIS — R2242 Localized swelling, mass and lump, left lower limb: Secondary | ICD-10-CM | POA: Diagnosis not present

## 2018-01-15 MED ORDER — IOPAMIDOL (ISOVUE-300) INJECTION 61%
100.0000 mL | Freq: Once | INTRAVENOUS | Status: AC | PRN
  Administered 2018-01-15: 100 mL via INTRAVENOUS

## 2018-01-22 DIAGNOSIS — H5319 Other subjective visual disturbances: Secondary | ICD-10-CM | POA: Diagnosis not present

## 2018-01-22 DIAGNOSIS — H43813 Vitreous degeneration, bilateral: Secondary | ICD-10-CM | POA: Diagnosis not present

## 2018-04-26 DIAGNOSIS — H5213 Myopia, bilateral: Secondary | ICD-10-CM | POA: Diagnosis not present

## 2018-04-26 DIAGNOSIS — H52223 Regular astigmatism, bilateral: Secondary | ICD-10-CM | POA: Diagnosis not present

## 2018-05-31 ENCOUNTER — Inpatient Hospital Stay: Payer: BLUE CROSS/BLUE SHIELD

## 2018-06-04 NOTE — Telephone Encounter (Signed)
LMTCB to see if pt would like to do virtual visit

## 2018-06-05 ENCOUNTER — Encounter: Payer: Self-pay | Admitting: Internal Medicine

## 2018-06-05 ENCOUNTER — Ambulatory Visit (INDEPENDENT_AMBULATORY_CARE_PROVIDER_SITE_OTHER): Payer: BLUE CROSS/BLUE SHIELD | Admitting: Internal Medicine

## 2018-06-05 DIAGNOSIS — F418 Other specified anxiety disorders: Secondary | ICD-10-CM | POA: Diagnosis not present

## 2018-06-05 MED ORDER — LORAZEPAM 0.5 MG PO TABS: 0.5000 mg | ORAL_TABLET | Freq: Two times a day (BID) | ORAL | 1 refills | Status: DC | PRN

## 2018-06-05 MED ORDER — BUSPIRONE HCL 15 MG PO TABS: ORAL_TABLET | ORAL | 3 refills | Status: DC

## 2018-06-05 NOTE — Progress Notes (Signed)
Virtual Visit via Video Note  I connected with Molly Quinn on 06/05/18 at  1:40 PM EDT by a video enabled telemedicine application and verified that I am speaking with the correct person using two identifiers.   I discussed the limitations of evaluation and management by telemedicine and the availability of in person appointments. The patient expressed understanding and agreed to proceed.  Molly Quinn is complaining of anxiety getting worse over past couple weeks.  She is unable to focus on work, driving etc. she is concerned about her family, coronavirus etc.  She is been having difficulty sleeping as well.  History of Present Illness:   Seizure disorder Observations/Objective:  Molly Quinn looks well.  She is in no acute distress.  She does not appear anxious at the moment Assessment and Plan: 1.  Situational anxiety.  Options discussed.  Will start buspirone 7.5 mg twice a day for 4 days then will advance to 50 mg twice a day.  Arzu will contact me if we need to go up on the dose.  Lorazepam 0.5-1 mg twice a day as needed severe anxiety/insomnia infrequently.  We discussed potential for habit-forming effect if used regularly. 2.  Seizure disorder.  Stable on current regimen   Follow Up Instructions:    I discussed the assessment and treatment plan with the patient. The patient was provided an opportunity to ask questions and all were answered. The patient agreed with the plan and demonstrated an understanding of the instructions.   The patient was advised to call back or seek an in-person evaluation if the symptoms worsen or if the condition fails to improve as anticipated.  I provided 11 minutes of non-face-to-face time during this encounter.   [name], MD

## 2018-06-05 NOTE — Assessment & Plan Note (Signed)
Options discussed.  Will start buspirone 7.5 mg twice a day for 4 days then will advance to 50 mg twice a day.  Marcina will contact me if we need to go up on the dose.  Lorazepam 0.5-1 mg twice a day as needed severe anxiety/insomnia infrequently.  We discussed potential for habit-forming effect if used regularly.

## 2018-08-01 ENCOUNTER — Telehealth: Payer: Self-pay

## 2018-08-01 NOTE — Telephone Encounter (Signed)
Patient would like to have appointment rescheduled.

## 2018-08-02 ENCOUNTER — Inpatient Hospital Stay: Payer: BLUE CROSS/BLUE SHIELD | Attending: Oncology

## 2018-12-02 ENCOUNTER — Inpatient Hospital Stay: Payer: BC Managed Care – PPO | Attending: Oncology | Admitting: Oncology

## 2018-12-02 ENCOUNTER — Inpatient Hospital Stay: Payer: BC Managed Care – PPO

## 2018-12-02 ENCOUNTER — Other Ambulatory Visit: Payer: Self-pay

## 2018-12-02 VITALS — BP 127/93 | HR 74 | Temp 98.0°F | Resp 17 | Ht 63.0 in | Wt 209.6 lb

## 2018-12-02 DIAGNOSIS — Z23 Encounter for immunization: Secondary | ICD-10-CM | POA: Insufficient documentation

## 2018-12-02 DIAGNOSIS — G40909 Epilepsy, unspecified, not intractable, without status epilepticus: Secondary | ICD-10-CM | POA: Insufficient documentation

## 2018-12-02 LAB — FERRITIN: Ferritin: 19 ng/mL (ref 11–307)

## 2018-12-02 MED ORDER — INFLUENZA VAC SPLIT QUAD 0.5 ML IM SUSY
0.5000 mL | PREFILLED_SYRINGE | Freq: Once | INTRAMUSCULAR | Status: AC
  Administered 2018-12-02: 12:00:00 0.5 mL via INTRAMUSCULAR

## 2018-12-02 NOTE — Progress Notes (Signed)
  [facility] OFFICE PROGRESS NOTE   Diagnosis: Hemochromatosis  INTERVAL HISTORY:   Ms. Limbert returns for a scheduled visit.  She reports anxiety related to multiple stressors.  She is seeing neurology for headaches.  She stopped exercising with the [place] pandemic.  She would like to get back to the gym.  She continues to have a menstrual cycle.  Objective:  Vital signs in last 24 hours:  Blood pressure (!) 127/93, pulse 74, temperature 98 F (36.7 C), temperature source Temporal, resp. rate 17, height 5\' 3"  (1.6 m), weight 209 lb 9.6 oz (95.1 kg), SpO2 100 %.    Limited physical examination secondary to distancing with the COVID pandemic GI: No hepatosplenomegaly Vascular: No leg edema    Lab Results:  Ferritin on 12/10/2017: 27 Medications: I have reviewed the patient's current medications.   Assessment/Plan: 1. Hereditary hemochromatosis: She last underwent phlebotomy therapy in February 2018 2. Seizure disorder: Maintained on Lamictal. 3. History of an abnormal liver ultrasound-negative abdomen CT 04/09/2014 4. Status post cesarean section delivery of a female baby in August 2012.    Disposition: Ms. Buice has hereditary hemochromatosis.  She has not undergone phlebotomy in several years.  We will follow-up on the ferritin level from today.  She will return for a ferritin level in 6 months and an office visit in 1 year.  Betsy Coder, MD  12/02/2018  11:46 AM

## 2019-01-24 IMAGING — MG 2D DIGITAL DIAGNOSTIC BILATERAL MAMMOGRAM WITH CAD AND ADJUNCT T
8 of 12 series · 8 of 28 positions shown · non-contrast
Comparison: Mammography 05/27/2015, 05/18/2015.

CLINICAL DATA: One year interval follow-up of likely benign
clustered cysts/apocrine metaplasia involving the upper inner
subareolar right breast. Annual evaluation, left breast.

EXAM:
2D DIGITAL DIAGNOSTIC BILATERAL MAMMOGRAM WITH CAD AND ADJUNCT TOMO
ULTRASOUND RIGHT BREAST

[L MLO synth-2D]
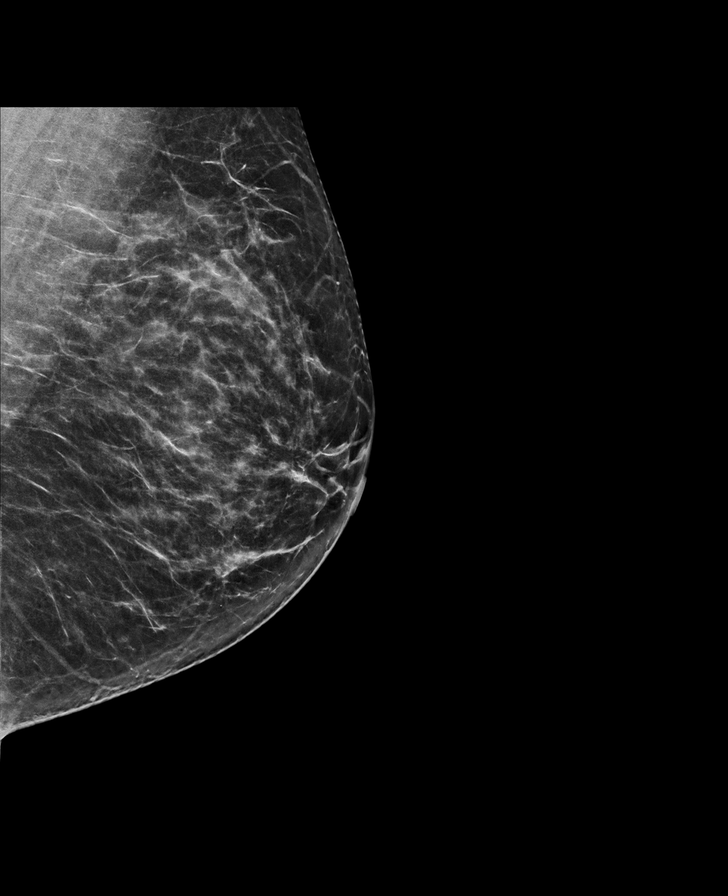

[R MLO synth-2D]
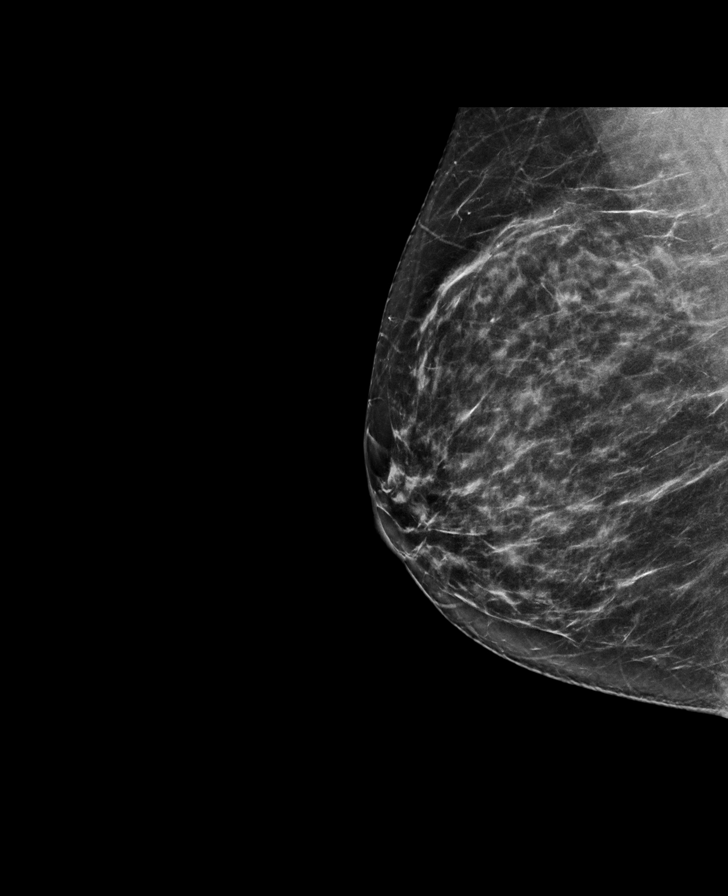

[R CC synth-2D]
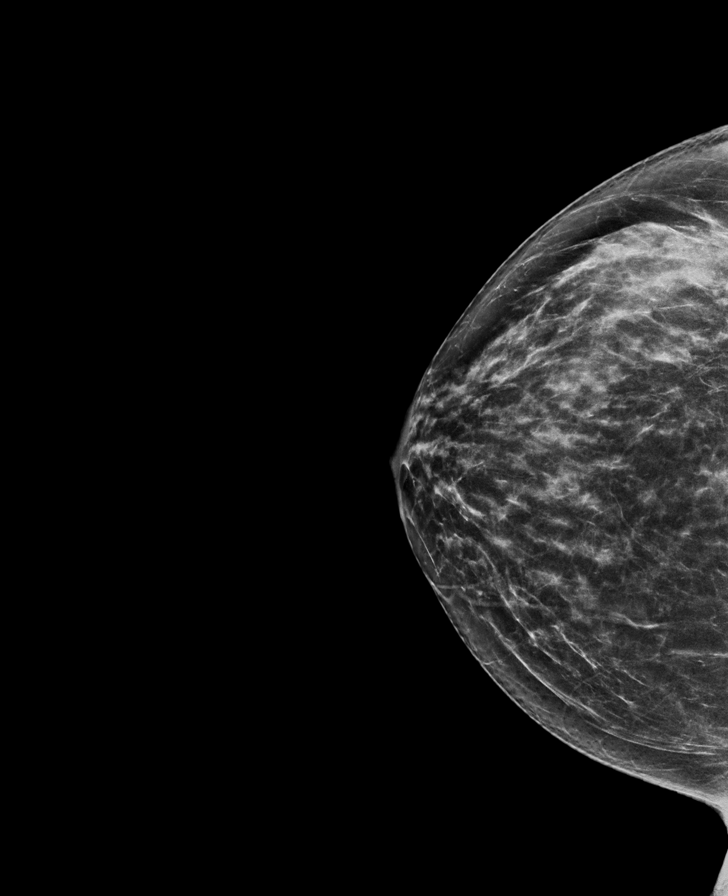

[L MLO]
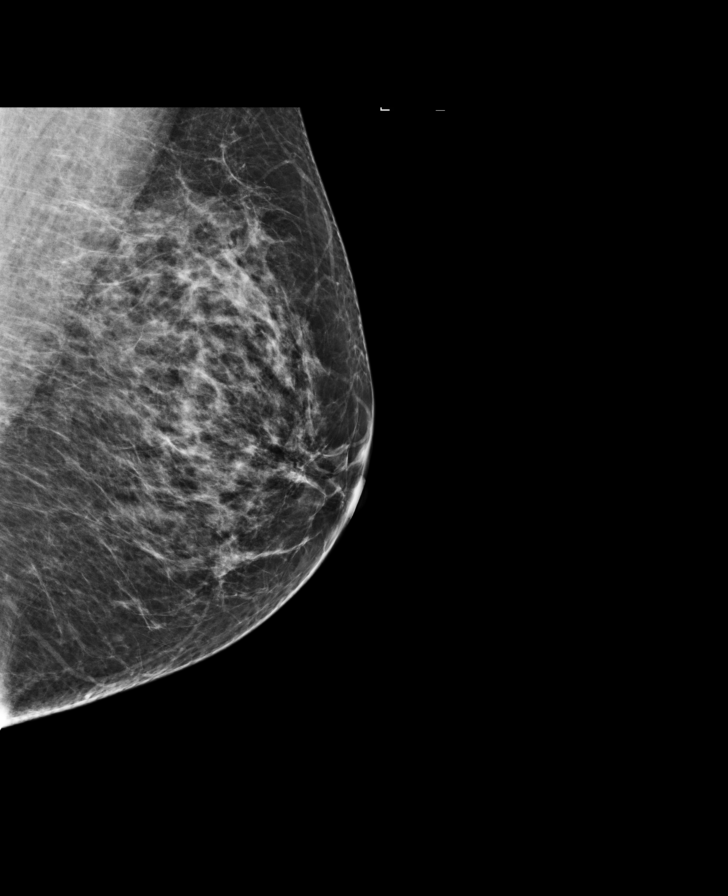

[R CC]
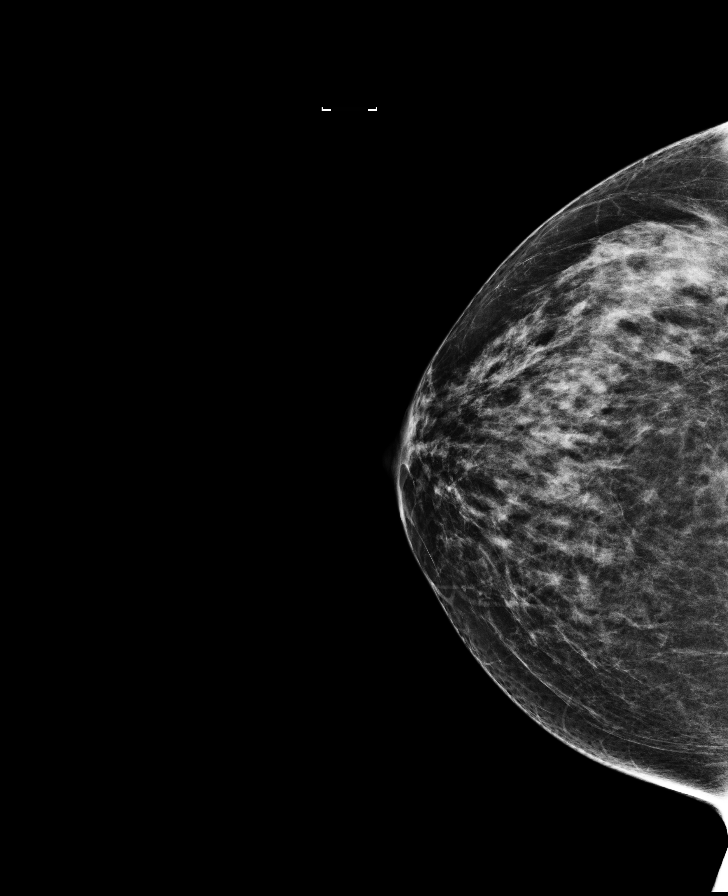

[L CC]
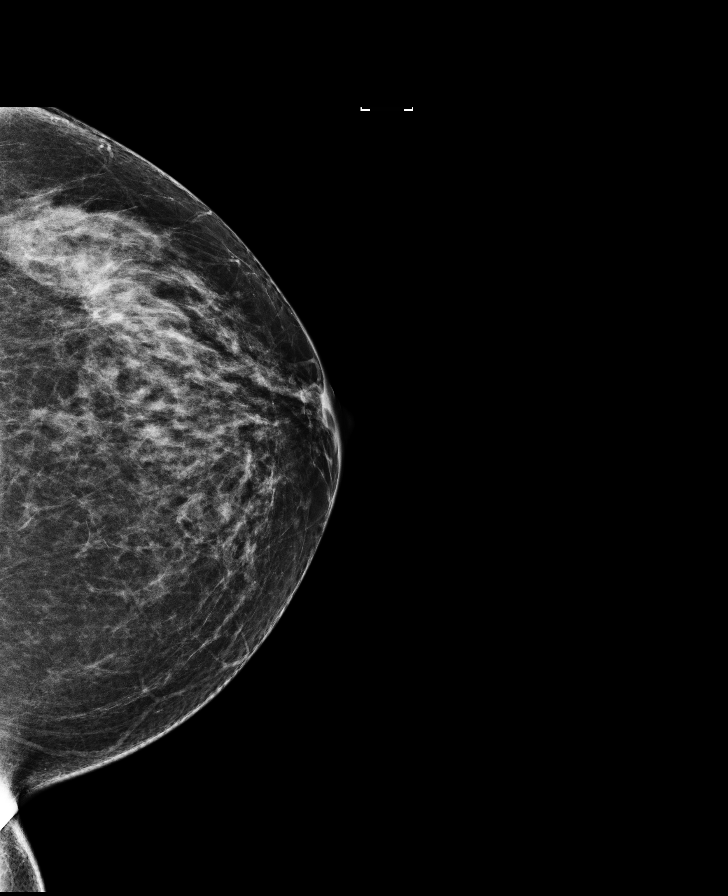

[R MLO]
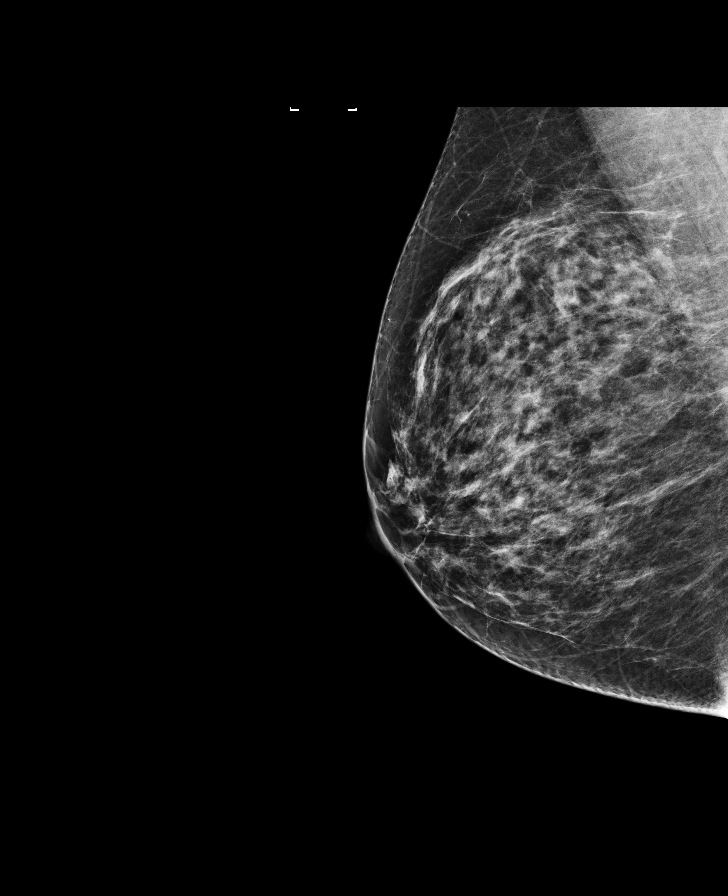

[L CC synth-2D]
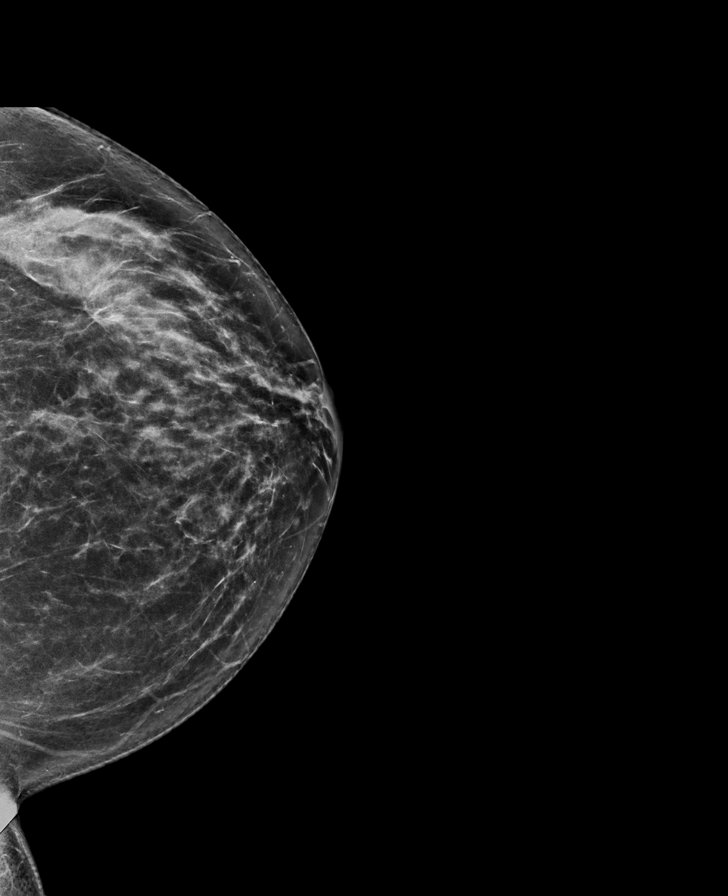

[8 of 28 positions shown; findings below may reference images not displayed]

Right breast
ultrasound 11/30/2015, 05/27/2015.

ACR Breast Density Category c: The breast tissue is heterogeneously
dense, which may obscure small masses.
FINDINGS: Standard 2D and tomosynthesis full field CC and MLO views of both
breasts were obtained.

The previously identified mass in the upper inner right breast at
anterior depth is less conspicuous on today's examination. No new or
suspicious findings elsewhere in the right breast.

No findings suspicious for malignancy in the left breast.

Mammographic images were processed with CAD.

On physical exam, there is no palpable abnormality in the upper
inner subareolar right breast.

Targeted right breast ultrasound is performed, showing the
previously identified multicystic mass at the 1 o'clock subareolar
position has decreased in size in the interval, current measurements
of approximately 2 x 8 x 5 mm (previously 5 x 8 x 5 mm). No
suspicious solid mass or abnormal acoustic shadowing is identified.
IMPRESSION: 1. Benign clustered cysts/apocrine metaplasia involving the upper
inner subareolar right breast, decreased in size since the
ultrasound 6 months ago.
2. No mammographic or sonographic evidence of malignancy, right
breast.
3. No mammographic evidence of malignancy, left breast.

RECOMMENDATION:
Screening mammogram in one year.(Code:[id number])

I have discussed the findings and recommendations with the patient.
Results were also provided in writing at the conclusion of the
visit. If applicable, a reminder letter will be sent to the patient
regarding the next appointment.

BI-RADS CATEGORY  2: Benign.

## 2019-01-29 ENCOUNTER — Other Ambulatory Visit: Payer: Self-pay

## 2019-01-29 ENCOUNTER — Other Ambulatory Visit: Payer: Self-pay | Admitting: Internal Medicine

## 2019-01-29 DIAGNOSIS — Z209 Contact with and (suspected) exposure to unspecified communicable disease: Secondary | ICD-10-CM

## 2019-01-29 DIAGNOSIS — Z20822 Contact with and (suspected) exposure to covid-19: Secondary | ICD-10-CM

## 2019-02-01 LAB — NOVEL CORONAVIRUS, NAA: SARS-CoV-2, NAA: NOT DETECTED

## 2019-02-19 ENCOUNTER — Other Ambulatory Visit: Payer: Self-pay | Admitting: Internal Medicine

## 2019-02-28 DIAGNOSIS — R519 Headache, unspecified: Secondary | ICD-10-CM | POA: Diagnosis not present

## 2019-02-28 DIAGNOSIS — G40B09 Juvenile myoclonic epilepsy, not intractable, without status epilepticus: Secondary | ICD-10-CM | POA: Diagnosis not present

## 2019-02-28 DIAGNOSIS — R030 Elevated blood-pressure reading, without diagnosis of hypertension: Secondary | ICD-10-CM | POA: Diagnosis not present

## 2019-04-22 DIAGNOSIS — Z01419 Encounter for gynecological examination (general) (routine) without abnormal findings: Secondary | ICD-10-CM | POA: Diagnosis not present

## 2019-04-22 DIAGNOSIS — Z6835 Body mass index (BMI) 35.0-35.9, adult: Secondary | ICD-10-CM | POA: Diagnosis not present

## 2019-04-29 DIAGNOSIS — H52223 Regular astigmatism, bilateral: Secondary | ICD-10-CM | POA: Diagnosis not present

## 2019-04-29 DIAGNOSIS — H524 Presbyopia: Secondary | ICD-10-CM | POA: Diagnosis not present

## 2019-04-29 DIAGNOSIS — H5213 Myopia, bilateral: Secondary | ICD-10-CM | POA: Diagnosis not present

## 2019-05-23 DIAGNOSIS — Z1231 Encounter for screening mammogram for malignant neoplasm of breast: Secondary | ICD-10-CM | POA: Diagnosis not present

## 2019-06-02 ENCOUNTER — Other Ambulatory Visit: Payer: Self-pay

## 2019-06-02 ENCOUNTER — Inpatient Hospital Stay: Payer: BC Managed Care – PPO | Attending: Oncology

## 2019-06-02 ENCOUNTER — Encounter: Payer: Self-pay | Admitting: Oncology

## 2019-06-02 DIAGNOSIS — Z23 Encounter for immunization: Secondary | ICD-10-CM

## 2019-06-02 LAB — FERRITIN: Ferritin: 12 ng/mL (ref 11–307)

## 2019-06-03 ENCOUNTER — Telehealth: Payer: Self-pay

## 2019-06-03 ENCOUNTER — Encounter: Payer: Self-pay | Admitting: Oncology

## 2019-06-03 NOTE — Telephone Encounter (Signed)
VM left regarding ferritin results. Requested pt either call [facility] or send a mychart message to let us know if she is having any bleeding

## 2019-06-03 NOTE — Telephone Encounter (Signed)
-----   Message from Ladell Pier, MD sent at 06/02/2019  5:11 PM EDT ----- Please call patient, ferritin remains in goal range, does she have any bleeding other than menstrual cycle?

## 2019-06-10 NOTE — Telephone Encounter (Signed)
    Patient following up on order for MRI,  from Dr Joesph July Can Dr Alain Marion order MRI?

## 2019-06-13 ENCOUNTER — Other Ambulatory Visit: Payer: Self-pay | Admitting: Internal Medicine

## 2019-06-13 DIAGNOSIS — G4489 Other headache syndrome: Secondary | ICD-10-CM

## 2019-06-17 ENCOUNTER — Encounter: Payer: Self-pay | Admitting: Oncology

## 2019-06-19 ENCOUNTER — Telehealth: Payer: Self-pay | Admitting: Oncology

## 2019-06-19 ENCOUNTER — Other Ambulatory Visit: Payer: Self-pay | Admitting: *Deleted

## 2019-06-19 NOTE — Telephone Encounter (Signed)
Scheduled appt per 4/8 sch message - pt aware of apt date and time

## 2019-06-19 NOTE — Progress Notes (Signed)
Per Dr. Benay Spice: Needs CBC and stool cards. Orders entered and scheduling message sent. Patient notified via Mychart message.

## 2019-06-23 ENCOUNTER — Telehealth: Payer: Self-pay | Admitting: *Deleted

## 2019-06-23 ENCOUNTER — Inpatient Hospital Stay: Payer: BC Managed Care – PPO | Attending: Oncology

## 2019-06-23 ENCOUNTER — Other Ambulatory Visit: Payer: Self-pay

## 2019-06-23 LAB — CBC WITH DIFFERENTIAL (CANCER CENTER ONLY)
Abs Immature Granulocytes: 0.03 10*3/uL (ref 0.00–0.07)
Basophils Absolute: 0 10*3/uL (ref 0.0–0.1)
Basophils Relative: 0 %
Eosinophils Absolute: 0 10*3/uL (ref 0.0–0.5)
Eosinophils Relative: 0 %
HCT: 41 % (ref 36.0–46.0)
Hemoglobin: 14 g/dL (ref 12.0–15.0)
Immature Granulocytes: 0 %
Lymphocytes Relative: 28 %
Lymphs Abs: 2.4 10*3/uL (ref 0.7–4.0)
MCH: 30.9 pg (ref 26.0–34.0)
MCHC: 34.1 g/dL (ref 30.0–36.0)
MCV: 90.5 fL (ref 80.0–100.0)
Monocytes Absolute: 0.5 10*3/uL (ref 0.1–1.0)
Monocytes Relative: 6 %
Neutro Abs: 5.8 10*3/uL (ref 1.7–7.7)
Neutrophils Relative %: 66 %
Platelet Count: 282 10*3/uL (ref 150–400)
RBC: 4.53 MIL/uL (ref 3.87–5.11)
RDW: 11.9 % (ref 11.5–15.5)
WBC Count: 8.8 10*3/uL (ref 4.0–10.5)
nRBC: 0 % (ref 0.0–0.2)

## 2019-06-23 NOTE — Telephone Encounter (Signed)
Notified of normal CBC. Instructed to still complete the stool cards since her ferritin had been low. She understands and agrees.

## 2019-06-23 NOTE — Telephone Encounter (Signed)
-----   Message from Ladell Pier, MD sent at 06/23/2019  1:14 PM EDT ----- Please call patient, cbc is normal, f/u as scheduled

## 2019-07-11 ENCOUNTER — Other Ambulatory Visit: Payer: Self-pay

## 2019-07-11 ENCOUNTER — Ambulatory Visit
Admission: RE | Admit: 2019-07-11 | Discharge: 2019-07-11 | Disposition: A | Discharge status: Home / Self Care | Payer: BC Managed Care – PPO | Source: Ambulatory Visit | Attending: Internal Medicine | Admitting: Internal Medicine

## 2019-07-11 DIAGNOSIS — G4489 Other headache syndrome: Secondary | ICD-10-CM

## 2019-07-11 DIAGNOSIS — R519 Headache, unspecified: Secondary | ICD-10-CM | POA: Diagnosis not present

## 2019-07-11 MED ORDER — GADOBENATE DIMEGLUMINE 529 MG/ML IV SOLN
18.0000 mL | Freq: Once | INTRAVENOUS | Status: AC | PRN
  Administered 2019-07-11: 18 mL via INTRAVENOUS

## 2019-08-29 LAB — OCCULT BLOOD X 1 CARD TO LAB, STOOL
Fecal Occult Bld: NEGATIVE
Fecal Occult Bld: NEGATIVE
Fecal Occult Bld: NEGATIVE

## 2019-11-04 ENCOUNTER — Other Ambulatory Visit: Payer: Self-pay | Admitting: Internal Medicine

## 2019-11-12 DIAGNOSIS — D485 Neoplasm of uncertain behavior of skin: Secondary | ICD-10-CM | POA: Diagnosis not present

## 2019-11-12 DIAGNOSIS — D225 Melanocytic nevi of trunk: Secondary | ICD-10-CM | POA: Diagnosis not present

## 2019-11-12 DIAGNOSIS — Z1283 Encounter for screening for malignant neoplasm of skin: Secondary | ICD-10-CM | POA: Diagnosis not present

## 2019-12-04 ENCOUNTER — Other Ambulatory Visit: Payer: Self-pay

## 2019-12-04 ENCOUNTER — Telehealth: Payer: Self-pay | Admitting: Oncology

## 2019-12-04 ENCOUNTER — Inpatient Hospital Stay: Payer: BC Managed Care – PPO

## 2019-12-04 ENCOUNTER — Other Ambulatory Visit: Payer: Self-pay | Admitting: *Deleted

## 2019-12-04 ENCOUNTER — Inpatient Hospital Stay: Payer: BC Managed Care – PPO | Attending: Oncology | Admitting: Oncology

## 2019-12-04 VITALS — BP 138/92 | HR 73 | Temp 98.0°F | Resp 16 | Wt 209.0 lb

## 2019-12-04 DIAGNOSIS — Z23 Encounter for immunization: Secondary | ICD-10-CM | POA: Diagnosis not present

## 2019-12-04 DIAGNOSIS — R569 Unspecified convulsions: Secondary | ICD-10-CM | POA: Diagnosis not present

## 2019-12-04 LAB — CBC WITH DIFFERENTIAL (CANCER CENTER ONLY)
Abs Immature Granulocytes: 0.02 10*3/uL (ref 0.00–0.07)
Basophils Absolute: 0 10*3/uL (ref 0.0–0.1)
Basophils Relative: 0 %
Eosinophils Absolute: 0 10*3/uL (ref 0.0–0.5)
Eosinophils Relative: 0 %
HCT: 42.4 % (ref 36.0–46.0)
Hemoglobin: 14.5 g/dL (ref 12.0–15.0)
Immature Granulocytes: 0 %
Lymphocytes Relative: 20 %
Lymphs Abs: 1.5 10*3/uL (ref 0.7–4.0)
MCH: 30.9 pg (ref 26.0–34.0)
MCHC: 34.2 g/dL (ref 30.0–36.0)
MCV: 90.4 fL (ref 80.0–100.0)
Monocytes Absolute: 0.6 10*3/uL (ref 0.1–1.0)
Monocytes Relative: 7 %
Neutro Abs: 5.7 10*3/uL (ref 1.7–7.7)
Neutrophils Relative %: 73 %
Platelet Count: 306 10*3/uL (ref 150–400)
RBC: 4.69 MIL/uL (ref 3.87–5.11)
RDW: 12.1 % (ref 11.5–15.5)
WBC Count: 7.8 10*3/uL (ref 4.0–10.5)
nRBC: 0 % (ref 0.0–0.2)

## 2019-12-04 LAB — FERRITIN: Ferritin: 21 ng/mL (ref 11–307)

## 2019-12-04 MED ORDER — INFLUENZA VAC SPLIT QUAD 0.5 ML IM SUSY
0.5000 mL | PREFILLED_SYRINGE | Freq: Once | INTRAMUSCULAR | Status: AC
  Administered 2019-12-04: 0.5 mL via INTRAMUSCULAR

## 2019-12-04 MED ORDER — INFLUENZA VAC SPLIT QUAD 0.5 ML IM SUSY
PREFILLED_SYRINGE | INTRAMUSCULAR | Status: AC
  Filled 2019-12-04: qty 0.5

## 2019-12-04 NOTE — Progress Notes (Signed)
  [facility] OFFICE PROGRESS NOTE   Diagnosis: Hemochromatosis  INTERVAL HISTORY:   Ms. Loy returns as scheduled.  Good appetite.  She is exercising.  She has a regular monthly menstrual cycle.  No other bleeding.  Objective:  Vital signs in last 24 hours:  There were no vitals taken for this visit.    Resp: Lungs clear bilaterally Cardio: Regular rate and rhythm GI: No hepatosplenomegaly, nontender Vascular: No leg edema   Lab Results:  Lab Results  Component Value Date   WBC 8.8 06/23/2019   HGB 14.0 06/23/2019   HCT 41.0 06/23/2019   MCV 90.5 06/23/2019   PLT 282 06/23/2019   NEUTROABS 5.8 06/23/2019    CMP  Lab Results  Component Value Date   NA 137 01/04/2018   K 4.1 01/04/2018   CL 103 01/04/2018   CO2 27 01/04/2018   GLUCOSE 75 01/04/2018   BUN 12 01/04/2018   CREATININE 0.91 01/04/2018   CALCIUM 9.1 01/04/2018   PROT 6.7 01/04/2018   ALBUMIN 4.5 01/04/2018   AST 12 01/04/2018   ALT 16 01/04/2018   ALKPHOS 70 01/04/2018   BILITOT 0.5 01/04/2018   GFRNONAA >60 04/25/2017   GFRAA >60 04/25/2017     Medications: I have reviewed the patient's current medications.   Assessment/Plan: 1. Hereditary hemochromatosis: She last underwent phlebotomy therapy in February 2018 2. Seizure disorder: Maintained on Lamictal. 3. History of an abnormal liver ultrasound-negative abdomen CT 04/09/2014 4. Status post cesarean section delivery of a female baby in August 2012.  Disposition: Molly Quinn appears well.  She last underwent phlebotomy in 2018.  The ferritin level has been in the low normal range over the past year.  The low ferritin is likely related to menorrhagia.  Stool Hemoccults were negative in June.  She had a normal colonoscopy in 2016  We will repeat a CBC and ferritin level today.  She will be referred to Dr. Silverio Decamp to discuss the low iron level and the indication for the neck screening colonoscopy.  She will return for a  lab visit in 6 months and an office visit in 1 year.  Betsy Coder, MD  12/04/2019  12:44 PM

## 2019-12-04 NOTE — Telephone Encounter (Signed)
Scheduled appointments per 9/23 los. Spoke with patient who is aware of upcoming appointments. Patient declined calendar print out.

## 2019-12-10 ENCOUNTER — Telehealth: Payer: Self-pay | Admitting: *Deleted

## 2019-12-10 NOTE — Telephone Encounter (Signed)
-----   Message from Ladell Pier, MD sent at 12/04/2019  5:40 PM EDT ----- Please call patient, hemoglobin and ferritin are normal, follow-up as scheduled

## 2019-12-10 NOTE — Telephone Encounter (Signed)
Notified of normal Hgb and ferritin. F/U as scheduled.

## 2019-12-17 ENCOUNTER — Encounter: Payer: Self-pay | Admitting: Gastroenterology

## 2020-01-26 DIAGNOSIS — G40B09 Juvenile myoclonic epilepsy, not intractable, without status epilepticus: Secondary | ICD-10-CM | POA: Diagnosis not present

## 2020-01-26 DIAGNOSIS — G441 Vascular headache, not elsewhere classified: Secondary | ICD-10-CM | POA: Diagnosis not present

## 2020-02-11 ENCOUNTER — Ambulatory Visit (INDEPENDENT_AMBULATORY_CARE_PROVIDER_SITE_OTHER): Payer: BC Managed Care – PPO | Admitting: Gastroenterology

## 2020-02-11 ENCOUNTER — Encounter: Payer: Self-pay | Admitting: Gastroenterology

## 2020-02-11 VITALS — BP 124/70 | HR 72 | Ht 63.0 in | Wt 214.0 lb

## 2020-02-11 DIAGNOSIS — R194 Change in bowel habit: Secondary | ICD-10-CM | POA: Diagnosis not present

## 2020-02-11 DIAGNOSIS — K219 Gastro-esophageal reflux disease without esophagitis: Secondary | ICD-10-CM

## 2020-02-11 DIAGNOSIS — Z1211 Encounter for screening for malignant neoplasm of colon: Secondary | ICD-10-CM | POA: Diagnosis not present

## 2020-02-11 DIAGNOSIS — E739 Lactose intolerance, unspecified: Secondary | ICD-10-CM

## 2020-02-11 MED ORDER — NA SULFATE-K SULFATE-MG SULF 17.5-3.13-1.6 GM/177ML PO SOLN: ORAL | 0 refills | Status: DC

## 2020-02-11 NOTE — Patient Instructions (Addendum)
You have been scheduled for a colonoscopy. Please follow written instructions given to you at your visit today.  Please pick up your prep supplies at the pharmacy within the next 1-3 days. If you use inhalers (even only as needed), please bring them with you on the day of your procedure.  Continue Dexilant  ADD Pepcid 20 mg at bedtime as needed  You can try to STOP  magnesium supplements for 1 week to see if that helps   If you are age 45 or older, your body mass index should be between 23-30. Your Body mass index is 37.91 kg/m. If this is out of the aforementioned range listed, please consider follow up with your Primary Care Provider.  If you are age 19 or younger, your body mass index should be between 19-25. Your Body mass index is 37.91 kg/m. If this is out of the aformentioned range listed, please consider follow up with your Primary Care Provider.    Due to recent changes in healthcare laws, you may see the results of your imaging and laboratory studies on MyChart before your provider has had a chance to review them.  We understand that in some cases there may be results that are confusing or concerning to you. Not all laboratory results come back in the same time frame and the provider may be waiting for multiple results in order to interpret others.  Please give [place] 48 hours in order for your provider to thoroughly review all the results before contacting the office for clarification of your results.    Gastroesophageal Reflux Disease, Adult Gastroesophageal reflux (GER) happens when acid from the stomach flows up into the tube that connects the mouth and the stomach (esophagus). Normally, food travels down the esophagus and stays in the stomach to be digested. However, when a person has GER, food and stomach acid sometimes move back up into the esophagus. If this becomes a more serious problem, the person may be diagnosed with a disease called gastroesophageal reflux disease (GERD).  GERD occurs when the reflux:  Happens often.  Causes frequent or severe symptoms.  Causes problems such as damage to the esophagus. When stomach acid comes in contact with the esophagus, the acid may cause soreness (inflammation) in the esophagus. Over time, GERD may create small holes (ulcers) in the lining of the esophagus. What are the causes? This condition is caused by a problem with the muscle between the esophagus and the stomach (lower esophageal sphincter, or LES). Normally, the LES muscle closes after food passes through the esophagus to the stomach. When the LES is weakened or abnormal, it does not close properly, and that allows food and stomach acid to go back up into the esophagus. The LES can be weakened by certain dietary substances, medicines, and medical conditions, including:  Tobacco use.  Pregnancy.  Having a hiatal hernia.  Alcohol use.  Certain foods and beverages, such as coffee, chocolate, onions, and peppermint. What increases the risk? You are more likely to develop this condition if you:  Have an increased body weight.  Have a connective tissue disorder.  Use NSAID medicines. What are the signs or symptoms? Symptoms of this condition include:  Heartburn.  Difficult or painful swallowing.  The feeling of having a lump in the throat.  Abitter taste in the mouth.  Bad breath.  Having a large amount of saliva.  Having an upset or bloated stomach.  Belching.  Chest pain. Different conditions can cause chest pain. Make sure  you see your health care provider if you experience chest pain.  Shortness of breath or wheezing.  Ongoing (chronic) cough or a night-time cough.  Wearing away of tooth enamel.  Weight loss. How is this diagnosed? Your health care provider will take a medical history and perform a physical exam. To determine if you have mild or severe GERD, your health care provider may also monitor how you respond to treatment. You  may also have tests, including:  A test to examine your stomach and esophagus with a small camera (endoscopy).  A test thatmeasures the acidity level in your esophagus.  A test thatmeasures how much pressure is on your esophagus.  A barium swallow or modified barium swallow test to show the shape, size, and functioning of your esophagus. How is this treated? The goal of treatment is to help relieve your symptoms and to prevent complications. Treatment for this condition may vary depending on how severe your symptoms are. Your health care provider may recommend:  Changes to your diet.  Medicine.  Surgery. Follow these instructions at home: Eating and drinking   Follow a diet as recommended by your health care provider. This may involve avoiding foods and drinks such as: ? Coffee and tea (with or without caffeine). ? Drinks that containalcohol. ? Energy drinks and sports drinks. ? Carbonated drinks or sodas. ? Chocolate and cocoa. ? Peppermint and mint flavorings. ? Garlic and onions. ? Horseradish. ? Spicy and acidic foods, including peppers, chili powder, curry powder, vinegar, hot sauces, and barbecue sauce. ? Citrus fruit juices and citrus fruits, such as oranges, lemons, and limes. ? Tomato-based foods, such as red sauce, chili, salsa, and pizza with red sauce. ? Fried and fatty foods, such as donuts, french fries, potato chips, and high-fat dressings. ? High-fat meats, such as hot dogs and fatty cuts of red and white meats, such as rib eye steak, sausage, ham, and bacon. ? High-fat dairy items, such as whole milk, butter, and cream cheese.  Eat small, frequent meals instead of large meals.  Avoid drinking large amounts of liquid with your meals.  Avoid eating meals during the 2-3 hours before bedtime.  Avoid lying down right after you eat.  Do not exercise right after you eat. Lifestyle   Do not use any products that contain nicotine or tobacco, such as  cigarettes, e-cigarettes, and chewing tobacco. If you need help quitting, ask your health care provider.  Try to reduce your stress by using methods such as yoga or meditation. If you need help reducing stress, ask your health care provider.  If you are overweight, reduce your weight to an amount that is healthy for you. Ask your health care provider for guidance about a safe weight loss goal. General instructions  Pay attention to any changes in your symptoms.  Take over-the-counter and prescription medicines only as told by your health care provider. Do not take aspirin, ibuprofen, or other NSAIDs unless your health care provider told you to do so.  Wear loose-fitting clothing. Do not wear anything tight around your waist that causes pressure on your abdomen.  Raise (elevate) the head of your bed about 6 inches (15 cm).  Avoid bending over if this makes your symptoms worse.  Keep all follow-up visits as told by your health care provider. This is important. Contact a health care provider if:  You have: ? New symptoms. ? Unexplained weight loss. ? Difficulty swallowing or it hurts to swallow. ? Wheezing or a persistent  cough. ? A hoarse voice.  Your symptoms do not improve with treatment. Get help right away if you:  Have pain in your arms, neck, jaw, teeth, or back.  Feel sweaty, dizzy, or light-headed.  Have chest pain or shortness of breath.  Vomit and your vomit looks like blood or coffee grounds.  Faint.  Have stool that is bloody or black.  Cannot swallow, drink, or eat. Summary  Gastroesophageal reflux happens when acid from the stomach flows up into the esophagus. GERD is a disease in which the reflux happens often, causes frequent or severe symptoms, or causes problems such as damage to the esophagus.  Treatment for this condition may vary depending on how severe your symptoms are. Your health care provider may recommend diet and lifestyle changes, medicine, or  surgery.  Contact a health care provider if you have new or worsening symptoms.  Take over-the-counter and prescription medicines only as told by your health care provider. Do not take aspirin, ibuprofen, or other NSAIDs unless your health care provider told you to do so.  Keep all follow-up visits as told by your health care provider. This is important. This information is not intended to replace advice given to you by your health care provider. Make sure you discuss any questions you have with your health care provider. Document Revised: 09/05/2017 Document Reviewed: 09/05/2017 Elsevier Patient Education  [place].   Lactose-Free Diet, Adult If you have lactose intolerance, you are not able to digest lactose. Lactose is a natural sugar found mainly in dairy milk and dairy products. You may need to avoid all foods and beverages that contain lactose. A lactose-free diet can help you do this. Which foods have lactose? Lactose is found in dairy milk and dairy products, such as:  Yogurt.  Cheese.  Butter.  Margarine.  Sour cream.  Cream.  Whipped toppings and nondairy creamers.  Ice cream and other dairy-based desserts. Lactose is also found in foods or products made with dairy milk or milk ingredients. To find out whether a food contains dairy milk or a milk ingredient, look at the ingredients list. Avoid foods with the statement "May contain milk" and foods that contain:  Milk powder.  Whey.  Curd.  Caseinate.  Lactose.  Lactalbumin.  Lactoglobulin. What are alternatives to dairy milk and foods made with milk products?  Lactose-free milk.  Soy milk with added calcium and vitamin D.  Almond milk, coconut milk, rice milk, or other nondairy milk alternatives with added calcium and vitamin D. Note that these are low in protein.  Soy products, such as soy yogurt, soy cheese, soy ice cream, and soy-based sour cream.  Other nut milk products, such as almond  yogurt, almond cheese, cashew yogurt, cashew cheese, cashew ice cream, coconut yogurt, and coconut ice cream. What are tips for following this plan?  Do not consume foods, beverages, vitamins, minerals, or medicines containing lactose. Read ingredient lists carefully.  Look for the words "lactose-free" on labels.  Use lactase enzyme drops or tablets as directed by your health care provider.  Use lactose-free milk or a milk alternative, such as soy milk or almond milk, for drinking and cooking.  Make sure you get enough calcium and vitamin D in your diet. A lactose-free eating plan can be lacking in these important nutrients.  Take calcium and vitamin D supplements as directed by your health care provider. Talk to your health care provider about supplements if you are not able to get enough calcium  and vitamin D from food. What foods can I eat?  Fruits All fresh, canned, frozen, or dried fruits that are not processed with lactose. Vegetables All fresh, frozen, and canned vegetables without cheese, cream, or butter sauces. Grains Any that are not made with dairy milk or dairy products. Meats and other proteins Any meat, fish, poultry, and other protein sources that are not made with dairy milk or dairy products. Soy cheese and yogurt. Fats and oils Any that are not made with dairy milk or dairy products. Beverages Lactose-free milk. Soy, rice, or almond milk with added calcium and vitamin D. Fruit and vegetable juices. Sweets and desserts Any that are not made with dairy milk or dairy products. Seasonings and condiments Any that are not made with dairy milk or dairy products. Calcium Calcium is found in many foods that contain lactose and is important for bone health. The amount of calcium you need depends on your age:  Adults younger than 50 years: 1,000 mg of calcium a day.  Adults older than 50 years: 1,200 mg of calcium a day. If you are not getting enough calcium, you may get  it from other sources, including:  Orange juice with calcium added. There are 300-350 mg of calcium in 1 cup of orange juice.  Calcium-fortified soy milk. There are 300-400 mg of calcium in 1 cup of calcium-fortified soy milk.  Calcium-fortified rice or almond milk. There are 300 mg of calcium in 1 cup of calcium-fortified rice or almond milk.  Calcium-fortified breakfast cereals. There are 100-1,000 mg of calcium in calcium-fortified breakfast cereals.  Spinach, cooked. There are 145 mg of calcium in  cup of cooked spinach.  Edamame, cooked. There are 130 mg of calcium in  cup of cooked edamame.  Collard greens, cooked. There are 125 mg of calcium in  cup of cooked collard greens.  Kale, frozen or cooked. There are 90 mg of calcium in  cup of cooked or frozen kale.  Almonds. There are 95 mg of calcium in  cup of almonds.  Broccoli, cooked. There are 60 mg of calcium in 1 cup of cooked broccoli. The items listed above may not be a complete list of recommended foods and beverages. Contact a dietitian for more options. What foods are not recommended? Fruits None, unless they are made with dairy milk or dairy products. Vegetables None, unless they are made with dairy milk or dairy products. Grains Any grains that are made with dairy milk or dairy products. Meats and other proteins None, unless they are made with dairy milk or dairy products. Dairy All dairy products, including milk, goat's milk, buttermilk, kefir, acidophilus milk, flavored milk, evaporated milk, condensed milk, dulce de [place], eggnog, yogurt, cheese, and cheese spreads. Fats and oils Any that are made with milk or milk products. Margarines and salad dressings that contain milk or cheese. Cream. Half and half. Cream cheese. Sour cream. Chip dips made with sour cream or yogurt. Beverages Hot chocolate. Cocoa with lactose. Instant iced teas. Powdered fruit drinks. Smoothies made with dairy milk or yogurt. Sweets  and desserts Any that are made with milk or milk products. Seasonings and condiments Chewing gum that has lactose. Spice blends if they contain lactose. Artificial sweeteners that contain lactose. Nondairy creamers. The items listed above may not be a complete list of foods and beverages to avoid. Contact a dietitian for more information. Summary  If you are lactose intolerant, it means that you have a hard time digesting lactose, a  natural sugar found in milk and milk products.  Following a lactose-free diet can help you manage this condition.  Calcium is important for bone health and is found in many foods that contain lactose. Talk with your health care provider about other sources of calcium. This information is not intended to replace advice given to you by your health care provider. Make sure you discuss any questions you have with your health care provider. Document Revised: 03/27/2017 Document Reviewed: 03/27/2017 Elsevier Patient Education  2020 [place] you for choosing [facility]  Kavitha Nandigam,MD

## 2020-02-11 NOTE — Progress Notes (Signed)
Molly Quinn    [id number]    1975-04-11  Primary Care Physician:Plotnikov, Evie Lacks, MD  Referring Physician: Ladell Pier, MD [address] [place],  [place] [address]   Chief complaint: Colon cancer screening, GERD  HPI: 44 year old very pleasant female here for new patient visit to discuss colorectal cancer screening Denies any nausea, vomiting, abdominal pain, melena or bright red blood per rectum No decreased appetite or unintentional weight loss Her brother has history of ulcerative colitis, had total colectomy at age 73.  He also has [place], developed bile duct cancer and underwent Whipple surgery when he was 53. Her cousin recently diagnosed with colon cancer Her mother has history of colitis and diverticulitis, had segmental colectomy  Outpatient Encounter Medications as of 02/11/2020  Medication Sig  . ALPRAZolam (XANAX) 0.5 MG tablet Take 1-2 tablets (0.5-1 mg total) 2 (two) times daily as needed by mouth for anxiety (for flight anxiety).  . Ascorbic Acid (VITAMIN C ADULT GUMMIES PO) Take 282 mg by mouth daily.  . Calcium Carbonate-Vitamin D (CALCIUM 600+D HIGH POTENCY PO) Take 1 tablet by mouth daily. 600mg /93mcg  . DEXILANT 60 MG capsule Take 1 capsule by mouth daily.  Marland Kitchen LAMICTAL XR 100 MG TB24 24 hour tablet Take 300 mg by mouth at bedtime.  . Magnesium 125 MG CAPS Take 125 mg by mouth daily.  . Omega-3 Fatty Acids (FISH OIL PO) Take 1,400 mg by mouth daily.  . [DISCONTINUED] LORazepam (ATIVAN) 0.5 MG tablet TAKE 1 TO 2 TABLETS(0.5 TO 1 MG) BY MOUTH TWICE DAILY AS NEEDED FOR ANXIETY OR SLEEP (Patient not taking: Reported on 02/11/2020)  . [DISCONTINUED] propranolol (INDERAL) 10 MG tablet Take 10 mg by mouth daily. (Patient not taking: Reported on 02/11/2020)   No facility-administered encounter medications on file as of 02/11/2020.    Allergies as of 02/11/2020 - Review Complete 02/11/2020  Allergen Reaction Noted  . Phenytoin Rash and  Other (See Comments) 10/27/2008  . Sulfa antibiotics Rash 11/08/2010    Past Medical History:  Diagnosis Date  . Anxiety   . Epilepsy ([place]) dx 1991   no breakthru while on meds, last in 1994  . Epilepsy ([place])   . GERD (gastroesophageal reflux disease)   . Hemochromatosis dx 03/2005 in [place]   phlebotomy prn - 1x/yr or less, cbc q37mo  . Hyperlipidemia   . IBS (irritable bowel syndrome)     Past Surgical History:  Procedure Laterality Date  . BUNIONECTOMY    . CESAREAN SECTION  11/08/2010   Procedure: CESAREAN SECTION;  Surgeon: Daria Pastures;  Location: [facility];  Service: Gynecology;  Laterality: N/A;  . lymph node removal    . REPAIR ANKLE LIGAMENT    . TRANSTHORACIC ECHOCARDIOGRAM  February 2017   EF 60-65%. Normal wall thickness &  wall motion. No infiltrative disease. Normal diastolic pressures.    Family History  Problem Relation Age of Onset  . Ulcerative colitis Mother   . Hypertension Mother   . Hyperlipidemia Mother   . Hypertension Father   . Hyperlipidemia Father   . Melanoma Father 67  . Ovarian cancer Paternal Aunt   . Cancer Paternal Aunt        ovar ca  . Ulcerative colitis Brother        coloectomy age 57  . Diabetes Maternal Grandmother   . Heart disease Maternal Grandmother   . Hypertension Brother   . Breast cancer Paternal  Grandmother   . Heart disease Maternal Uncle   . Prostate cancer Maternal Uncle   . Colon polyps Maternal Uncle   . Hemochromatosis Brother   . Other Brother        [place]  . Other Brother        bile duct cancer  . Colon cancer Neg Hx     Social History   Socioeconomic History  . Marital status: Married    Spouse name: Not on file  . Number of children: 1  . Years of education: Not on file  . Highest education level: Not on file  Occupational History  . Occupation: disability attorney    Employer: [employer]  Tobacco Use  . Smoking status: Never Smoker  . Smokeless tobacco: Never Used  Vaping Use  . Vaping  Use: Never used  Substance and Sexual Activity  . Alcohol use: Yes    Comment: occasional  . Drug use: No  . Sexual activity: Not on file  Other Topics Concern  . Not on file  Social History Narrative  . Not on file   Social Determinants of Health   Financial Resource Strain:   . Difficulty of Paying Living Expenses: Not on file  Food Insecurity:   . Worried About Charity fundraiser in the Last Year: Not on file  . Ran Out of Food in the Last Year: Not on file  Transportation Needs:   . Lack of Transportation (Medical): Not on file  . Lack of Transportation (Non-Medical): Not on file  Physical Activity:   . Days of Exercise per Week: Not on file  . Minutes of Exercise per Session: Not on file  Stress:   . Feeling of Stress : Not on file  Social Connections:   . Frequency of Communication with Friends and Family: Not on file  . Frequency of Social Gatherings with Friends and Family: Not on file  . Attends Religious Services: Not on file  . Active Member of Clubs or Organizations: Not on file  . Attends Archivist Meetings: Not on file  . Marital Status: Not on file  Intimate Partner Violence:   . Fear of Current or Ex-Partner: Not on file  . Emotionally Abused: Not on file  . Physically Abused: Not on file  . Sexually Abused: Not on file      Review of systems: All other review of systems negative except as mentioned in the HPI.   Physical Exam: Vitals:   02/11/20 0850  BP: 124/70  Pulse: 72  SpO2: 97%   Body mass index is 37.91 kg/m. Gen:      No acute distress HEENT:  sclera anicteric Abd:      soft, non-tender; no palpable masses, no distension Ext:    No edema Neuro: alert and oriented x 3 Psych: normal mood and affect  Data Reviewed:  Reviewed labs, radiology imaging, old records and pertinent past GI work up   Assessment and Plan/Recommendations:  44 year old female with family history of ulcerative colitis, PSC, bile duct cancer in  first-degree relative and colon cancer in second-degree relative Will plan to proceed with colonoscopy for colorectal cancer screening based on current guidelines The risks and benefits as well as alternatives of endoscopic procedure(s) have been discussed and reviewed. All questions answered. The patient agrees to proceed.  Increased bowel frequency: Trial of lactose-free diet Hold magnesium supplements as could also be contributing to increased bowel frequency  GERD: Improving with Dexilant Add Pepcid  at bedtime for nocturnal symptoms Antireflux measures   The patient was provided an opportunity to ask questions and all were answered. The patient agreed with the plan and demonstrated an understanding of the instructions.  Damaris Hippo , MD    CC: Ladell Pier, MD

## 2020-02-12 ENCOUNTER — Encounter: Payer: Self-pay | Admitting: Gastroenterology

## 2020-02-27 DIAGNOSIS — R41840 Attention and concentration deficit: Secondary | ICD-10-CM | POA: Diagnosis not present

## 2020-02-27 DIAGNOSIS — F331 Major depressive disorder, recurrent, moderate: Secondary | ICD-10-CM | POA: Diagnosis not present

## 2020-03-11 ENCOUNTER — Ambulatory Visit (INDEPENDENT_AMBULATORY_CARE_PROVIDER_SITE_OTHER): Payer: BC Managed Care – PPO | Admitting: Internal Medicine

## 2020-03-11 ENCOUNTER — Encounter: Payer: Self-pay | Admitting: Internal Medicine

## 2020-03-11 ENCOUNTER — Other Ambulatory Visit: Payer: Self-pay

## 2020-03-11 VITALS — BP 120/82 | HR 95 | Temp 98.1°F | Ht 63.0 in | Wt 213.0 lb

## 2020-03-11 DIAGNOSIS — F418 Other specified anxiety disorders: Secondary | ICD-10-CM | POA: Diagnosis not present

## 2020-03-11 DIAGNOSIS — G40802 Other epilepsy, not intractable, without status epilepticus: Secondary | ICD-10-CM

## 2020-03-11 DIAGNOSIS — Z1152 Encounter for screening for COVID-19: Secondary | ICD-10-CM | POA: Diagnosis not present

## 2020-03-11 DIAGNOSIS — F339 Major depressive disorder, recurrent, unspecified: Secondary | ICD-10-CM

## 2020-03-11 MED ORDER — VORTIOXETINE HBR 5 MG PO TABS: 5.0000 mg | ORAL_TABLET | Freq: Every day | ORAL | 1 refills | Status: DC

## 2020-03-11 NOTE — Progress Notes (Signed)
Subjective:  Patient ID: Molly Quinn, female    DOB: 1976/03/04  Age: 44 y.o. MRN: [id number]  CC: Medication Problem   HPI Molly Quinn presents for ADD - tested, no ADD C/o depression and apathy, decreased concentration Dr Rexene Edison recommended antidepressants. Her psychiatrist Rx'd Trintellix -[name] never took Trintellix Working 29 h/wk [name] is asking for a Covid test due to the option of a New Year's gathering   Outpatient Medications Prior to Visit  Medication Sig Dispense Refill  . ALPRAZolam (XANAX) 0.5 MG tablet Take 1-2 tablets (0.5-1 mg total) 2 (two) times daily as needed by mouth for anxiety (for flight anxiety). 30 tablet 1  . Calcium Carbonate-Vitamin D (CALCIUM 600+D HIGH POTENCY PO) Take 1 tablet by mouth daily. 638m/20mcg    . DEXILANT 60 MG capsule Take 1 capsule by mouth daily.    .Marland KitchenLAMICTAL XR 100 MG TB24 24 hour tablet Take 300 mg by mouth at bedtime.  0  . Magnesium 125 MG CAPS Take 125 mg by mouth daily.    . Na Sulfate-K Sulfate-Mg Sulf 17.5-3.13-1.6 GM/177ML SOLN 1 prep kit 354 mL 0  . Omega-3 Fatty Acids (FISH OIL PO) Take 1,400 mg by mouth daily.    . Ascorbic Acid (VITAMIN C ADULT GUMMIES PO) Take 282 mg by mouth daily. (Patient not taking: Reported on 03/11/2020)     No facility-administered medications prior to visit.    ROS: Review of Systems  Constitutional: Negative for activity change, appetite change, chills, fatigue and unexpected weight change.  HENT: Negative for congestion, mouth sores and sinus pressure.   Eyes: Negative for visual disturbance.  Respiratory: Negative for cough and chest tightness.   Gastrointestinal: Negative for abdominal pain and nausea.  Genitourinary: Negative for difficulty urinating, frequency and vaginal pain.  Musculoskeletal: Negative for back pain and gait problem.  Skin: Negative for pallor and rash.  Neurological: Negative for dizziness, tremors, seizures, weakness, numbness and headaches.   Psychiatric/Behavioral: Positive for decreased concentration. Negative for confusion, sleep disturbance and suicidal ideas. The patient is nervous/anxious.     Objective:  BP 120/82   Pulse 95   Temp 98.1 F (36.7 C) (Oral)   Ht 5' 3"  (1.6 m)   Wt 213 lb (96.6 kg)   SpO2 97%   BMI 37.73 kg/m   BP Readings from Last 3 Encounters:  03/11/20 120/82  02/11/20 124/70  12/04/19 (!) 138/92    Wt Readings from Last 3 Encounters:  03/11/20 213 lb (96.6 kg)  02/11/20 214 lb (97.1 kg)  12/04/19 209 lb (94.8 kg)    Physical Exam Constitutional:      General: She is not in acute distress.    Appearance: She is well-developed. She is obese.  HENT:     Head: Normocephalic.     Right Ear: External ear normal.     Left Ear: External ear normal.     Nose: Nose normal.     Mouth/Throat:     Mouth: Oropharynx is clear and moist.  Eyes:     General:        Right eye: No discharge.        Left eye: No discharge.     Conjunctiva/sclera: Conjunctivae normal.     Pupils: Pupils are equal, round, and reactive to light.  Neck:     Thyroid: No thyromegaly.     Vascular: No JVD.     Trachea: No tracheal deviation.  Cardiovascular:     Rate and Rhythm:  Normal rate and regular rhythm.     Heart sounds: Normal heart sounds.  Pulmonary:     Effort: No respiratory distress.     Breath sounds: No stridor. No wheezing.  Abdominal:     General: Bowel sounds are normal. There is no distension.     Palpations: Abdomen is soft. There is no mass.     Tenderness: There is no abdominal tenderness. There is no guarding or rebound.  Musculoskeletal:        General: No tenderness or edema.     Cervical back: Normal range of motion and neck supple.  Lymphadenopathy:     Cervical: No cervical adenopathy.  Skin:    Findings: No erythema or rash.  Neurological:     Cranial Nerves: No cranial nerve deficit.     Motor: No abnormal muscle tone.     Coordination: Coordination normal.     Deep Tendon  Reflexes: Reflexes normal.  Psychiatric:        Mood and Affect: Mood and affect normal.        Behavior: Behavior normal.        Thought Content: Thought content normal.        Judgment: Judgment normal.     Lab Results  Component Value Date   WBC 7.8 12/04/2019   HGB 14.5 12/04/2019   HCT 42.4 12/04/2019   PLT 306 12/04/2019   GLUCOSE 75 01/04/2018   CHOL 185 01/04/2018   TRIG 201.0 (H) 01/04/2018   HDL 40.40 01/04/2018   LDLDIRECT 133.0 01/04/2018   LDLCALC 144 (H) 02/09/2017   ALT 16 01/04/2018   AST 12 01/04/2018   NA 137 01/04/2018   K 4.1 01/04/2018   CL 103 01/04/2018   CREATININE 0.91 01/04/2018   BUN 12 01/04/2018   CO2 27 01/04/2018   TSH 2.12 01/04/2018   HGBA1C 5.1 05/01/2017    MR Brain W Wo Contrast  Result Date: 07/11/2019 CLINICAL DATA:  Chronic headache without new features. History of seizures. Vascular sounds. EXAM: MRI HEAD WITHOUT AND WITH CONTRAST TECHNIQUE: Multiplanar, multiecho pulse sequences of the brain and surrounding structures were obtained without and with intravenous contrast. CONTRAST:  58m MULTIHANCE GADOBENATE DIMEGLUMINE 529 MG/ML IV SOLN COMPARISON:  None. FINDINGS: Brain: The brain has a normal appearance without evidence of malformation, atrophy, old or acute small or large vessel infarction, mass lesion, hemorrhage, hydrocephalus or extra-axial collection. No sign of demyelinating disease. After contrast administration, no abnormal enhancement occurs. Venous sinuses appear normal. Vascular: Major vessels at the base of the brain show flow. Venous sinuses appear patent as noted above. Skull and upper cervical spine: Normal. Sinuses/Orbits: Clear/normal. Other: None significant. IMPRESSION: Normal examination. No abnormality seen to explain the presenting symptoms. Electronically Signed   By: MNelson ChimesM.D.   On: 07/11/2019 13:24    Assessment & Plan:   There are no diagnoses linked to this encounter.   No orders of the defined  types were placed in this encounter.    Follow-up: No follow-ups on file.  AWalker Kehr MD

## 2020-03-11 NOTE — Patient Instructions (Signed)
You can try Lion's Mane Mushroom capsules for memory, focus, neuropathy ([employer] or [employer])   

## 2020-03-12 NOTE — Assessment & Plan Note (Signed)
Will start Trintellix at low-dose at 5 mg a day.  Risks, benefits discussed.  Call with problems.  She can try lions mane as a supplement. We discussed head there is history of seizures (last one at the age of 48).  She has been on Lamictal chronically.  I think we will be safe to use a low-dose Trintellix.

## 2020-03-12 NOTE — Assessment & Plan Note (Signed)
Will start Trintellix at low-dose at 5 mg a day. We discussed head there is history of seizures (last one at the age of 54).  She has been on Lamictal chronically.  I think we will be safe to use a low-dose Trintellix.

## 2020-03-12 NOTE — Assessment & Plan Note (Signed)
Will start Trintellix at low-dose at 5 mg a day.  Risks, benefits discussed.  Call with problems.  She can try lions mane as a supplement. We discussed head there is history of seizures (last one at the age of 18).  She has been on Lamictal chronically.  I think we will be safe to use a low-dose Trintellix. 

## 2020-03-15 ENCOUNTER — Telehealth: Payer: Self-pay | Admitting: Internal Medicine

## 2020-03-15 NOTE — Telephone Encounter (Signed)
Jaton, Do we have it? Thx

## 2020-03-15 NOTE — Telephone Encounter (Signed)
Completed Pa vis cover-my-meds w/ Key # [id number]. Rec'd msg bck stating " This request has received a Favorable outcome from [facility]. Please keep in mind this is not a guarantee of payment. Eligibility and Benefit determinations will be made at the time of service.". Coverage dates starting 03/15/20 - 03/14/21. Notified pharmacy and pt via mychart.Marland KitchenRaechel Chute

## 2020-03-15 NOTE — Telephone Encounter (Signed)
    Patient calling for COVID test results   

## 2020-03-16 NOTE — Telephone Encounter (Signed)
I spoke to pt on the afternoon of  03/15/20. I informed her there was a lab error with her Covid nasal swab. I offered to have her come back for re-collection and she declined at the time. I informed her to call us back if she changed her mind and wants further testing. Pt verbalized understanding.

## 2020-03-16 NOTE — Addendum Note (Signed)
Addended by: Merrilyn Puma on: 03/16/2020 10:47 AM   Modules accepted: Orders

## 2020-03-17 NOTE — Telephone Encounter (Signed)
Pt rescheduled for Covid test for 03/19/19 at 8:30

## 2020-03-17 NOTE — Telephone Encounter (Signed)
Follow up message  Patient requesting to have Covid test again She states she does have some congestion and recently with a person that was COVID positive  Please advise

## 2020-03-17 NOTE — Telephone Encounter (Signed)
Noted. Thx.

## 2020-03-18 DIAGNOSIS — Z1152 Encounter for screening for COVID-19: Secondary | ICD-10-CM | POA: Diagnosis not present

## 2020-03-22 LAB — NOVEL CORONAVIRUS, NAA: SARS-CoV-2, NAA: NOT DETECTED

## 2020-03-29 ENCOUNTER — Encounter: Payer: BC Managed Care – PPO | Admitting: Gastroenterology

## 2020-04-08 NOTE — Progress Notes (Signed)
Subjective:    Patient ID: Molly Quinn, female    DOB: 03/09/1976, 45 y.o.   MRN: [id number]  HPI The patient is here for an acute visit.   BP issues: in the past she would have an occasional elevated BP so she has been checking it at home.   her BP at home has been elevated, but occasionally is normal.  Recently she has gotten the readings of 150's/100's, 144/112, 143/105.  She did bring her cuff here today and it is exactly the same as we had gotten.  The past week she has been eating low sodium.  She has been exercising.  She has lost a little weight.  She does have anxiety.  She was wondering if she can get a refill of her lorazepam.  She does not take this on a daily basis and tries to keep it to a minimum.     Medications and allergies reviewed with patient and updated if appropriate.  Patient Active Problem List   Diagnosis Date Noted  . Paresthesia 05/01/2017  . Well adult exam 01/23/2017  . Depression 01/23/2017  . Palpitations 07/23/2015  . Nonallopathic lesion of cervical region 09/17/2014  . Nonallopathic lesion-rib cage 09/17/2014  . Nonallopathic lesion of thoracic region 09/17/2014  . Neck pain on right side 09/03/2014  . Right hand pain 09/03/2014  . Uterine fibroid 04/15/2014  . Abdominal pain, left lower quadrant 04/01/2014  . Cough 03/10/2014  . Allergic rhinitis 07/03/2013  . URI, acute 07/03/2013  . Fatigue 08/26/2012  . Headache 08/26/2012  . Hyperglycemia   . Irritable bowel syndrome 11/02/2008  . HYPERLIPIDEMIA 10/27/2008  . Situational anxiety 10/27/2008  . Epilepsy ([place]) 10/27/2008  . 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 ALPRAZolam (XANAX) 0.5 MG tablet Take 1-2 tablets (0.5-1 mg total) 2 (two) times daily as needed by mouth for anxiety (for flight anxiety). 30 tablet 1  . Calcium Carbonate-Vitamin D (CALCIUM 600+D HIGH POTENCY PO) Take 1 tablet by mouth  daily. 648m/20mcg    . DEXILANT 60 MG capsule Take 1 capsule by mouth daily.    .Marland KitchenLAMICTAL XR 100 MG TB24 24 hour tablet Take 300 mg by mouth at bedtime.  0  . Magnesium 125 MG CAPS Take 125 mg by mouth daily.    . Na Sulfate-K Sulfate-Mg Sulf 17.5-3.13-1.6 GM/177ML SOLN 1 prep kit 354 mL 0  . Omega-3 Fatty Acids (FISH OIL PO) Take 1,400 mg by mouth daily.    .Marland Kitchenvortioxetine HBr (TRINTELLIX) 5 MG TABS tablet Take 1 tablet (5 mg total) by mouth daily. 90 tablet 1  . Ascorbic Acid (VITAMIN C ADULT GUMMIES PO) Take 282 mg by mouth daily. (Patient not taking: No sig reported)    . Magnesium Citrate 125 MG CAPS Take by mouth.     No current facility-administered medications on file prior to visit.    Past Medical History:  Diagnosis Date  . Anxiety   . Epilepsy ([place] 1991   no breakthru while on meds, last in 1994  . [place]   . GERD (gastroesophageal reflux disease)   . Hemochromatosis dx 03/2005 in [place]   phlebotomy prn - 1x/yr or less, cbc q649mo. Hyperlipidemia   . IBS (irritable bowel syndrome)     Past Surgical History:  Procedure Laterality Date  . BUNIONECTOMY    . CESAREAN SECTION  11/08/2010   Procedure: CESAREAN  SECTION;  Surgeon: Daria Pastures;  Location: [facility];  Service: Gynecology;  Laterality: N/A;  . lymph node removal    . REPAIR ANKLE LIGAMENT    . TRANSTHORACIC ECHOCARDIOGRAM  February 2017   EF 60-65%. Normal wall thickness &  wall motion. No infiltrative disease. Normal diastolic pressures.    Social History   Socioeconomic History  . Marital status: Married    Spouse name: Not on file  . Number of children: 1  . Years of education: Not on file  . Highest education level: Not on file  Occupational History  . Occupation: disability attorney    Employer: [employer]  Tobacco Use  . Smoking status: Never Smoker  . Smokeless tobacco: Never Used  Vaping Use  . Vaping Use: Never used  Substance and Sexual Activity  . Alcohol use: Yes     Comment: occasional  . Drug use: No  . Sexual activity: Not on file  Other Topics Concern  . Not on file  Social History Narrative  . Not on file   Social Determinants of Health   Financial Resource Strain: Not on file  Food Insecurity: Not on file  Transportation Needs: Not on file  Physical Activity: Not on file  Stress: Not on file  Social Connections: Not on file    Family History  Problem Relation Age of Onset  . Ulcerative colitis Mother   . Hypertension Mother   . Hyperlipidemia Mother   . Hypertension Father   . Hyperlipidemia Father   . Melanoma Father 46  . Ovarian cancer Paternal Aunt   . Cancer Paternal Aunt        ovar ca  . Ulcerative colitis Brother        coloectomy age 32  . Diabetes Maternal Grandmother   . Heart disease Maternal Grandmother   . Hypertension Brother   . Breast cancer Paternal Grandmother   . Heart disease Maternal Uncle   . Prostate cancer Maternal Uncle   . Colon polyps Maternal Uncle   . Hemochromatosis Brother   . Other Brother        [place]  . Other Brother        bile duct cancer  . Colon cancer Neg Hx     Review of Systems  Constitutional: Negative for fever.  Eyes: Negative for visual disturbance.  Respiratory: Negative for shortness of breath.   Cardiovascular: Negative for chest pain and palpitations.  Neurological: Positive for headaches (not tied to BP). Negative for light-headedness.       Objective:   Vitals:   04/09/20 1456  BP: (!) 158/106  Pulse: 78  Temp: 98 F (36.7 C)  SpO2: 98%   BP Readings from Last 3 Encounters:  04/09/20 (!) 158/106  03/11/20 120/82  02/11/20 124/70   Wt Readings from Last 3 Encounters:  04/09/20 208 lb (94.3 kg)  03/11/20 213 lb (96.6 kg)  02/11/20 214 lb (97.1 kg)   Body mass index is 36.85 kg/m.   Physical Exam    Constitutional: Appears well-developed and well-nourished. No distress.  Head: Normocephalic and atraumatic.  Neck: Neck supple. No tracheal  deviation present. No thyromegaly present.  No cervical lymphadenopathy Cardiovascular: Normal rate, regular rhythm and normal heart sounds.  No murmur heard. No carotid bruit .  No edema Pulmonary/Chest: Effort normal and breath sounds normal. No respiratory distress. No has no wheezes. No rales.  Skin: Skin is warm and dry. Not diaphoretic.  Psychiatric: Anxious mood and affect.  Behavior is normal.       Assessment & Plan:    See Problem List for Assessment and Plan of chronic medical problems.    This visit occurred during the SARS-CoV-2 public health emergency.  Safety protocols were in place, including screening questions prior to the visit, additional usage of staff PPE, and extensive cleaning of exam room while observing appropriate contact time as indicated for disinfecting solutions.

## 2020-04-09 ENCOUNTER — Ambulatory Visit (INDEPENDENT_AMBULATORY_CARE_PROVIDER_SITE_OTHER): Payer: BC Managed Care – PPO | Admitting: Internal Medicine

## 2020-04-09 ENCOUNTER — Encounter: Payer: Self-pay | Admitting: Internal Medicine

## 2020-04-09 ENCOUNTER — Other Ambulatory Visit: Payer: Self-pay

## 2020-04-09 VITALS — BP 144/90 | HR 78 | Temp 98.0°F | Ht 63.0 in | Wt 208.0 lb

## 2020-04-09 DIAGNOSIS — F418 Other specified anxiety disorders: Secondary | ICD-10-CM

## 2020-04-09 DIAGNOSIS — I10 Essential (primary) hypertension: Secondary | ICD-10-CM

## 2020-04-09 MED ORDER — TELMISARTAN 20 MG PO TABS: 20.0000 mg | ORAL_TABLET | Freq: Every day | ORAL | 5 refills | Status: DC

## 2020-04-09 MED ORDER — LORAZEPAM 0.5 MG PO TABS
ORAL_TABLET | ORAL | 0 refills | Status: DC
Start: 2020-04-09 — End: 2020-06-17

## 2020-04-09 NOTE — Assessment & Plan Note (Signed)
Molly Quinn Was started on Trintellix and has a follow-up for that soon, but experiencing increased anxiety and would like a refill of the lorazepam-I will give her a refill, but discussed she needs to follow-up with her PCP to discuss this further.  She understands that it is habit-forming and may potentially even affect memory [place] controlled substance database checked-refill sent to pharmacy Follow-up with PCP in 3-4 weeks

## 2020-04-09 NOTE — Patient Instructions (Addendum)
  Blood work was ordered.     Medications changes include :  Lorazepam renewed.   Start telmisartan for your BP.    Your prescription(s) have been submitted to your pharmacy. Please take as directed and contact our office if you believe you are having problem(s) with the medication(s).    Please follow up with Dr Christen Bame.

## 2020-04-09 NOTE — Assessment & Plan Note (Addendum)
New Blood pressure elevated here and has been consistently elevated at home-her blood pressure cuff is accurate Start telmisartan 20 mg daily She will continue to monitor her BP at home Continue low-sodium diet, regular exercise and weight loss efforts Follow-up with PCP in 3-4 weeks, advised she will likely need to have her kidney function checked at that time

## 2020-04-30 DIAGNOSIS — H02204 Unspecified lagophthalmos left upper eyelid: Secondary | ICD-10-CM | POA: Diagnosis not present

## 2020-04-30 DIAGNOSIS — H02201 Unspecified lagophthalmos right upper eyelid: Secondary | ICD-10-CM | POA: Diagnosis not present

## 2020-04-30 DIAGNOSIS — H02202 Unspecified lagophthalmos right lower eyelid: Secondary | ICD-10-CM | POA: Diagnosis not present

## 2020-04-30 DIAGNOSIS — H02205 Unspecified lagophthalmos left lower eyelid: Secondary | ICD-10-CM | POA: Diagnosis not present

## 2020-05-06 DIAGNOSIS — Z01419 Encounter for gynecological examination (general) (routine) without abnormal findings: Secondary | ICD-10-CM | POA: Diagnosis not present

## 2020-05-06 DIAGNOSIS — Z6836 Body mass index (BMI) 36.0-36.9, adult: Secondary | ICD-10-CM | POA: Diagnosis not present

## 2020-05-11 ENCOUNTER — Other Ambulatory Visit: Payer: Self-pay | Admitting: Obstetrics and Gynecology

## 2020-05-11 DIAGNOSIS — L821 Other seborrheic keratosis: Secondary | ICD-10-CM | POA: Diagnosis not present

## 2020-05-11 DIAGNOSIS — S2096XA Insect bite (nonvenomous) of unspecified parts of thorax, initial encounter: Secondary | ICD-10-CM | POA: Diagnosis not present

## 2020-05-11 DIAGNOSIS — R102 Pelvic and perineal pain: Secondary | ICD-10-CM

## 2020-05-20 ENCOUNTER — Ambulatory Visit
Admission: RE | Admit: 2020-05-20 | Discharge: 2020-05-20 | Disposition: A | Discharge status: Home / Self Care | Payer: BC Managed Care – PPO | Source: Ambulatory Visit | Attending: Obstetrics and Gynecology | Admitting: Obstetrics and Gynecology

## 2020-05-20 DIAGNOSIS — R102 Pelvic and perineal pain: Secondary | ICD-10-CM

## 2020-05-20 DIAGNOSIS — D259 Leiomyoma of uterus, unspecified: Secondary | ICD-10-CM | POA: Diagnosis not present

## 2020-06-04 ENCOUNTER — Inpatient Hospital Stay: Payer: BC Managed Care – PPO | Attending: Oncology

## 2020-06-04 ENCOUNTER — Other Ambulatory Visit: Payer: Self-pay

## 2020-06-04 LAB — CBC WITH DIFFERENTIAL (CANCER CENTER ONLY)
Abs Immature Granulocytes: 0.03 10*3/uL (ref 0.00–0.07)
Basophils Absolute: 0 10*3/uL (ref 0.0–0.1)
Basophils Relative: 0 %
Eosinophils Absolute: 0 10*3/uL (ref 0.0–0.5)
Eosinophils Relative: 0 %
HCT: 40.7 % (ref 36.0–46.0)
Hemoglobin: 13.8 g/dL (ref 12.0–15.0)
Immature Granulocytes: 0 %
Lymphocytes Relative: 31 %
Lymphs Abs: 2.9 10*3/uL (ref 0.7–4.0)
MCH: 30.1 pg (ref 26.0–34.0)
MCHC: 33.9 g/dL (ref 30.0–36.0)
MCV: 88.9 fL (ref 80.0–100.0)
Monocytes Absolute: 0.6 10*3/uL (ref 0.1–1.0)
Monocytes Relative: 6 %
Neutro Abs: 6 10*3/uL (ref 1.7–7.7)
Neutrophils Relative %: 63 %
Platelet Count: 309 10*3/uL (ref 150–400)
RBC: 4.58 MIL/uL (ref 3.87–5.11)
RDW: 12.2 % (ref 11.5–15.5)
WBC Count: 9.5 10*3/uL (ref 4.0–10.5)
nRBC: 0 % (ref 0.0–0.2)

## 2020-06-04 LAB — FERRITIN: Ferritin: 12 ng/mL (ref 11–307)

## 2020-06-10 DIAGNOSIS — Z6837 Body mass index (BMI) 37.0-37.9, adult: Secondary | ICD-10-CM | POA: Diagnosis not present

## 2020-06-10 DIAGNOSIS — N946 Dysmenorrhea, unspecified: Secondary | ICD-10-CM | POA: Diagnosis not present

## 2020-06-11 ENCOUNTER — Telehealth: Payer: Self-pay | Admitting: *Deleted

## 2020-06-11 DIAGNOSIS — Z1231 Encounter for screening mammogram for malignant neoplasm of breast: Secondary | ICD-10-CM | POA: Diagnosis not present

## 2020-06-11 NOTE — Telephone Encounter (Signed)
-----   Message from Ladell Pier, MD sent at 06/10/2020  1:47 PM EDT ----- Please call patient, ferritin remains low, does she still have a menstrual cycle?,  Any other bleeding?,  As she undergone a screening colonoscopy  Repeat CBC and ferritin in 3 months

## 2020-06-11 NOTE — Telephone Encounter (Signed)
Notified of persistent low ferritin. Confirmed she is still having menstrual cycles and they are normal. No other s/s of bleeding. She was supposed to have colonoscopy in January and she cancelled it due to being ill. She will contact GI to reschedule and will call office when it is done. Scheduling message sent for repeat lab in 3 months. She prefers to go to [facility] for lab.

## 2020-06-15 ENCOUNTER — Telehealth: Payer: Self-pay | Admitting: Gastroenterology

## 2020-06-15 NOTE — Telephone Encounter (Signed)
Hi Dr. Silverio Decamp, this pt was scheduled for a colon back inJanuary but had to cancel because she got sick. She saw you in clinic last December 2021 prior to scheduling her colonoscopy. Is it ok to reschedule procedure or would you like to see her in the office again? Thank you.

## 2020-06-15 NOTE — Telephone Encounter (Signed)
Colon r/s to 6/6 at 9:30am at [facility]

## 2020-06-15 NOTE — Telephone Encounter (Signed)
Its fine to reschedule procedure without office visit, thanks

## 2020-06-16 ENCOUNTER — Other Ambulatory Visit: Payer: Self-pay

## 2020-06-17 ENCOUNTER — Other Ambulatory Visit: Payer: Self-pay

## 2020-06-17 ENCOUNTER — Encounter: Payer: Self-pay | Admitting: Internal Medicine

## 2020-06-17 ENCOUNTER — Ambulatory Visit (INDEPENDENT_AMBULATORY_CARE_PROVIDER_SITE_OTHER): Payer: BC Managed Care – PPO | Admitting: Internal Medicine

## 2020-06-17 DIAGNOSIS — F339 Major depressive disorder, recurrent, unspecified: Secondary | ICD-10-CM

## 2020-06-17 DIAGNOSIS — F418 Other specified anxiety disorders: Secondary | ICD-10-CM

## 2020-06-17 DIAGNOSIS — G40802 Other epilepsy, not intractable, without status epilepticus: Secondary | ICD-10-CM

## 2020-06-17 MED ORDER — ALPRAZOLAM 0.5 MG PO TABS: 0.5000 mg | ORAL_TABLET | Freq: Two times a day (BID) | ORAL | 1 refills | Status: DC | PRN

## 2020-06-17 MED ORDER — VORTIOXETINE HBR 10 MG PO TABS: 10.0000 mg | ORAL_TABLET | Freq: Every day | ORAL | 1 refills | Status: DC

## 2020-06-17 MED ORDER — TELMISARTAN 20 MG PO TABS: 20.0000 mg | ORAL_TABLET | Freq: Every day | ORAL | 1 refills | Status: DC

## 2020-06-17 NOTE — Progress Notes (Signed)
Subjective:  Patient ID: Molly Quinn, female    DOB: 18-Jun-1975  Age: 45 y.o. MRN: [id number]  CC: Follow-up (F/U on BP and anti-anxiety med)   HPI Molly Quinn presents for HTN, depression, hemochromatosis. He seems to be doing better on Trintellix Molly Quinn is planning to travel to [place] with family No seizures  Outpatient Medications Prior to Visit  Medication Sig Dispense Refill  . ALPRAZolam (XANAX) 0.5 MG tablet Take 1-2 tablets (0.5-1 mg total) 2 (two) times daily as needed by mouth for anxiety (for flight anxiety). 30 tablet 1  . Calcium Carbonate-Vitamin D (CALCIUM 600+D HIGH POTENCY PO) Take 1 tablet by mouth daily. 619m/20mcg    . DEXILANT 60 MG capsule Take 1 capsule by mouth daily.    .Marland KitchenLAMICTAL XR 100 MG TB24 24 hour tablet Take 300 mg by mouth at bedtime.  0  . LORazepam (ATIVAN) 0.5 MG tablet TAKE 1 TO 2 TABLETS(0.5 TO 1 MG) BY MOUTH TWICE DAILY AS NEEDED FOR ANXIETY OR SLEEP 60 tablet 0  . Magnesium 125 MG CAPS Take 125 mg by mouth daily.    . Omega-3 Fatty Acids (FISH OIL PO) Take 1,400 mg by mouth daily.    .Marland Kitchentelmisartan (MICARDIS) 20 MG tablet Take 1 tablet (20 mg total) by mouth daily. 30 tablet 5  . vortioxetine HBr (TRINTELLIX) 5 MG TABS tablet Take 1 tablet (5 mg total) by mouth daily. 90 tablet 1  . Magnesium Citrate 125 MG CAPS Take by mouth.    . Na Sulfate-K Sulfate-Mg Sulf 17.5-3.13-1.6 GM/177ML SOLN 1 prep kit (Patient not taking: Reported on 06/17/2020) 354 mL 0   No facility-administered medications prior to visit.    ROS: Review of Systems  Constitutional: Negative for activity change, appetite change, chills, fatigue and unexpected weight change.  HENT: Negative for congestion, mouth sores and sinus pressure.   Eyes: Negative for visual disturbance.  Respiratory: Negative for cough and chest tightness.   Gastrointestinal: Negative for abdominal pain and nausea.  Genitourinary: Negative for difficulty urinating, frequency and vaginal  pain.  Musculoskeletal: Negative for back pain and gait problem.  Skin: Negative for pallor and rash.  Neurological: Negative for dizziness, tremors, weakness, numbness and headaches.  Psychiatric/Behavioral: Negative for confusion, sleep disturbance and suicidal ideas. The patient is nervous/anxious.     Objective:  BP 132/90 (BP Location: Left Arm)   Pulse 70   Temp 98.8 F (37.1 C) (Oral)   Ht _0  (1.6 m)   Wt 212 lb 12.8 oz (96.5 kg)   SpO2 97%   BMI 37.70 kg/m   BP Readings from Last 3 Encounters:  06/17/20 132/90  04/09/20 (!) 144/90  03/11/20 120/82    Wt Readings from Last 3 Encounters:  06/17/20 212 lb 12.8 oz (96.5 kg)  04/09/20 208 lb (94.3 kg)  03/11/20 213 lb (96.6 kg)    Physical Exam Constitutional:      General: She is not in acute distress.    Appearance: She is well-developed. She is obese.  HENT:     Head: Normocephalic.     Right Ear: External ear normal.     Left Ear: External ear normal.     Nose: Nose normal.  Eyes:     General:        Right eye: No discharge.        Left eye: No discharge.     Conjunctiva/sclera: Conjunctivae normal.     Pupils: Pupils are equal, round, and reactive  to light.  Neck:     Thyroid: No thyromegaly.     Vascular: No JVD.     Trachea: No tracheal deviation.  Cardiovascular:     Rate and Rhythm: Normal rate and regular rhythm.     Heart sounds: Normal heart sounds.  Pulmonary:     Effort: No respiratory distress.     Breath sounds: No stridor. No wheezing.  Abdominal:     General: Bowel sounds are normal. There is no distension.     Palpations: Abdomen is soft. There is no mass.     Tenderness: There is no abdominal tenderness. There is no guarding or rebound.  Musculoskeletal:        General: No tenderness.     Cervical back: Normal range of motion and neck supple.  Lymphadenopathy:     Cervical: No cervical adenopathy.  Skin:    Findings: No erythema or rash.  Neurological:     Mental Status: She  is oriented to person, place, and time.     Cranial Nerves: No cranial nerve deficit.     Motor: No abnormal muscle tone.     Coordination: Coordination normal.     Deep Tendon Reflexes: Reflexes normal.  Psychiatric:        Behavior: Behavior normal.        Thought Content: Thought content normal.        Judgment: Judgment normal.     Lab Results  Component Value Date   WBC 9.5 06/04/2020   HGB 13.8 06/04/2020   HCT 40.7 06/04/2020   PLT 309 06/04/2020   GLUCOSE 75 01/04/2018   CHOL 185 01/04/2018   TRIG 201.0 (H) 01/04/2018   HDL 40.40 01/04/2018   LDLDIRECT 133.0 01/04/2018   LDLCALC 144 (H) 02/09/2017   ALT 16 01/04/2018   AST 12 01/04/2018   NA 137 01/04/2018   K 4.1 01/04/2018   CL 103 01/04/2018   CREATININE 0.91 01/04/2018   BUN 12 01/04/2018   CO2 27 01/04/2018   TSH 2.12 01/04/2018   HGBA1C 5.1 05/01/2017    US PELVIC COMPLETE WITH TRANSVAGINAL  Result Date: 05/21/2020 CLINICAL DATA:  Pelvic pain EXAM: TRANSABDOMINAL AND TRANSVAGINAL ULTRASOUND OF PELVIS TECHNIQUE: Both transabdominal and transvaginal ultrasound examinations of the pelvis were performed. Transabdominal technique was performed for global imaging of the pelvis including uterus, ovaries, adnexal regions, and pelvic cul-de-sac. It was necessary to proceed with endovaginal exam following the transabdominal exam to visualize the uterus, endometrium, ovaries and adnexa. COMPARISON:  None FINDINGS: Uterus Measurements: 11.5 x 5.2 x 6.2 cm = volume: 197 mL. At least 3 fibroids noted, the largest a left posterior fibroid measuring up to 2.7 cm. Endometrium Thickness: 7 mm in thickness.  No focal abnormality visualized. Right ovary Measurements: 1.9 x 1.5 x 2.1 cm = volume: 3.1 mL. Normal appearance/no adnexal mass. Left ovary Measurements: 3.2 x 1.6 x 2.9 cm = volume: 8.0 mL. Normal appearance/no adnexal mass. Other findings No abnormal free fluid. IMPRESSION: Fibroid uterus. No acute findings. Electronically  Signed   By: Rolm Baptise M.D.   On: 05/21/2020 10:57    Assessment & Plan:     Follow-up: No follow-ups on file.  [name], MD

## 2020-06-20 NOTE — Assessment & Plan Note (Signed)
Molly Quinn is doing fair on Trintellix 5 mg a day.  We will try to increase to 10 mg a day if tolerated.  Xanax as needed for anxiety around flying.  No alcohol of 2 Xanax.  Potential benefits of a long/short term benzodiazepines  use as well as potential risks  and complications were explained to the patient and were aknowledged.

## 2020-06-20 NOTE — Assessment & Plan Note (Signed)
Doing fair on Trintellix 5 mg a day.  We will try to increase to 10 mg a day if tolerated.

## 2020-06-20 NOTE — Assessment & Plan Note (Signed)
On Lamictal.  No seizure relapse.

## 2020-07-20 DIAGNOSIS — H9313 Tinnitus, bilateral: Secondary | ICD-10-CM | POA: Diagnosis not present

## 2020-07-20 DIAGNOSIS — K219 Gastro-esophageal reflux disease without esophagitis: Secondary | ICD-10-CM | POA: Diagnosis not present

## 2020-08-05 ENCOUNTER — Encounter: Payer: Self-pay | Admitting: Oncology

## 2020-08-11 ENCOUNTER — Telehealth: Payer: Self-pay | Admitting: Gastroenterology

## 2020-08-11 NOTE — Telephone Encounter (Signed)
Need to know if patient has her prep kit and what she has  Previously it was [employer]

## 2020-08-11 NOTE — Telephone Encounter (Signed)
Inbound call from pt and per Dr. Silverio Decamp gave the okay for her to get an Colonoscopy but pt needs instructions again before her procedure. Please advise

## 2020-08-16 ENCOUNTER — Ambulatory Visit (AMBULATORY_SURGERY_CENTER): Payer: BC Managed Care – PPO | Admitting: Gastroenterology

## 2020-08-16 ENCOUNTER — Encounter: Payer: Self-pay | Admitting: Gastroenterology

## 2020-08-16 ENCOUNTER — Other Ambulatory Visit: Payer: Self-pay

## 2020-08-16 VITALS — BP 121/81 | HR 74 | Temp 98.0°F | Resp 22 | Ht 63.0 in | Wt 214.0 lb

## 2020-08-16 DIAGNOSIS — D123 Benign neoplasm of transverse colon: Secondary | ICD-10-CM

## 2020-08-16 DIAGNOSIS — D124 Benign neoplasm of descending colon: Secondary | ICD-10-CM

## 2020-08-16 DIAGNOSIS — K219 Gastro-esophageal reflux disease without esophagitis: Secondary | ICD-10-CM

## 2020-08-16 DIAGNOSIS — Z1211 Encounter for screening for malignant neoplasm of colon: Secondary | ICD-10-CM | POA: Diagnosis not present

## 2020-08-16 DIAGNOSIS — R194 Change in bowel habit: Secondary | ICD-10-CM

## 2020-08-16 MED ORDER — SODIUM CHLORIDE 0.9 % IV SOLN: 500.0000 mL | INTRAVENOUS | Status: DC

## 2020-08-16 NOTE — Patient Instructions (Addendum)
Handouts on polyps ,diverticulosis, & hemorrhoids given to you today  Await pathology on polyps removed    Upper Endoscopy & a Previsist w/ nurse  scheduled ,see below for dates and time     YOU HAD AN ENDOSCOPIC PROCEDURE TODAY AT [facility]:   Refer to the procedure report that was given to you for any specific questions about what was found during the examination.  If the procedure report does not answer your questions, please call your gastroenterologist to clarify.  If you requested that your care partner not be given the details of your procedure findings, then the procedure report has been included in a sealed envelope for you to review at your convenience later.  YOU SHOULD EXPECT: Some feelings of bloating in the abdomen. Passage of more gas than usual.  Walking can help get rid of the air that was put into your GI tract during the procedure and reduce the bloating. If you had a lower endoscopy (such as a colonoscopy or flexible sigmoidoscopy) you may notice spotting of blood in your stool or on the toilet paper. If you underwent a bowel prep for your procedure, you may not have a normal bowel movement for a few days.  Please Note:  You might notice some irritation and congestion in your nose or some drainage.  This is from the oxygen used during your procedure.  There is no need for concern and it should clear up in a day or so.  SYMPTOMS TO REPORT IMMEDIATELY:   Following lower endoscopy (colonoscopy or flexible sigmoidoscopy):  Excessive amounts of blood in the stool  Significant tenderness or worsening of abdominal pains  Swelling of the abdomen that is new, acute  Fever of 100F or higher    For urgent or emergent issues, a gastroenterologist can be reached at any hour by calling [phone]. Do not use MyChart messaging for urgent concerns.    DIET:  We do recommend a small meal at first, but then you may proceed to your regular diet.  Drink  plenty of fluids but you should avoid alcoholic beverages for 24 hours.  ACTIVITY:  You should plan to take it easy for the rest of today and you should NOT DRIVE or use heavy machinery until tomorrow (because of the sedation medicines used during the test).    FOLLOW UP: Our staff will call the number listed on your records 48-72 hours following your procedure to check on you and address any questions or concerns that you may have regarding the information given to you following your procedure. If we do not reach you, we will leave a message.  We will attempt to reach you two times.  During this call, we will ask if you have developed any symptoms of COVID 19. If you develop any symptoms (ie: fever, flu-like symptoms, shortness of breath, cough etc.) before then, please call [phone].  If you test positive for Covid 19 in the 2 weeks post procedure, please call and report this information to [place].    If any biopsies were taken you will be contacted by phone or by letter within the next 1-3 weeks.  Please call us at [phone] if you have not heard about the biopsies in 3 weeks.    SIGNATURES/CONFIDENTIALITY: You and/or your care partner have signed paperwork which will be entered into your electronic medical record.  These signatures attest to the fact that that the information above on your After Visit  Summary has been reviewed and is understood.  Full responsibility of the confidentiality of this discharge information lies with you and/or your care-partner. 

## 2020-08-16 NOTE — Progress Notes (Signed)
pt tolerated well. VSS. awake and to recovery. Report given to RN.  

## 2020-08-16 NOTE — Progress Notes (Signed)
Called to room to assist during endoscopic procedure.  Patient ID and intended procedure confirmed with present staff. Received instructions for my participation in the procedure from the performing physician.  

## 2020-08-16 NOTE — Op Note (Signed)
[facility] Patient Name: Molly Quinn Procedure Date: 08/16/2020 9:34 AM MRN: [id number] Endoscopist: Mauri Pole , MD Age: 45 Referring MD:  Date of Birth: 1976/03/13 Gender: Female Account #: [id number] Procedure:                Colonoscopy Indications:              Screening for colorectal malignant neoplasm Medicines:                Monitored Anesthesia Care Procedure:                Pre-Anesthesia Assessment:                           - Prior to the procedure, a History and Physical                            was performed, and patient medications and                            allergies were reviewed. The patient's tolerance of                            previous anesthesia was also reviewed. The risks                            and benefits of the procedure and the sedation                            options and risks were discussed with the patient.                            All questions were answered, and informed consent                            was obtained. Prior Anticoagulants: The patient has                            taken no previous anticoagulant or antiplatelet                            agents. ASA Grade Assessment: II - A patient with                            mild systemic disease. After reviewing the risks                            and benefits, the patient was deemed in                            satisfactory condition to undergo the procedure.                           After obtaining informed consent, the colonoscope  was passed under direct vision. Throughout the                            procedure, the patient's blood pressure, pulse, and                            oxygen saturations were monitored continuously. The                            Olympus [id number]-[id number] (#[id number]) Colonoscope was                            introduced through the anus and advanced to the the                            terminal  ileum, with identification of the                            appendiceal orifice and IC valve. The colonoscopy                            was performed without difficulty. The patient                            tolerated the procedure well. The quality of the                            bowel preparation was excellent. The ileocecal                            valve, appendiceal orifice, and rectum were                            photographed. Scope In: 9:49:59 AM Scope Out: 10:00:46 AM Scope Withdrawal Time: 0 hours 7 minutes 34 seconds  Total Procedure Duration: 0 hours 10 minutes 47 seconds  Findings:                 The perianal and digital rectal examinations were                            normal.                           Two sessile polyps were found in the descending                            colon and transverse colon. The polyps were 7 to 11                            mm in size. These polyps were removed with a cold                            snare. Resection and retrieval were complete.  A few small-mouthed diverticula were found in the                            sigmoid colon and descending colon.                           Non-bleeding internal hemorrhoids were found during                            retroflexion. The hemorrhoids were small. Complications:            No immediate complications. Estimated Blood Loss:     Estimated blood loss was minimal. Impression:               - Two 7 to 11 mm polyps in the descending colon and                            in the transverse colon, removed with a cold snare.                            Resected and retrieved.                           - Diverticulosis in the sigmoid colon and in the                            descending colon.                           - Non-bleeding internal hemorrhoids. Recommendation:           - Patient has a contact number available for                            emergencies. The  signs and symptoms of potential                            delayed complications were discussed with the                            patient. Return to normal activities tomorrow.                            Written discharge instructions were provided to the                            patient.                           - Resume previous diet.                           - Continue present medications.                           - Await pathology results.                           -  Repeat colonoscopy in 3 - 5 years for                            surveillance based on pathology results.                           - Schedule EGD for further evaluation of iron                            deficiency and exclude potential upper GI source                            for blood loss Mauri Pole, MD 08/16/2020 10:09:16 AM This report has been signed electronically.

## 2020-08-17 NOTE — Telephone Encounter (Signed)
Patient has had her colonoscopy

## 2020-08-18 ENCOUNTER — Telehealth: Payer: Self-pay

## 2020-08-18 NOTE — Telephone Encounter (Signed)
  Follow up Call-  Call back number 08/16/2020  Post procedure Call Back phone  # [phone]  Permission to leave phone message Yes  Some recent data might be hidden     Patient questions:  Do you have a fever, pain , or abdominal swelling? No. Pain Score  0 *  Have you tolerated food without any problems? Yes.    Have you been able to return to your normal activities? Yes.    Do you have any questions about your discharge instructions: Diet   No. Medications  No. Follow up visit  No.  Do you have questions or concerns about your Care? No.  Actions: * If pain score is 4 or above: No action needed, pain <4.  1. Have you developed a fever since your procedure? no  2.   Have you had an respiratory symptoms (SOB or cough) since your procedure? no  3.   Have you tested positive for COVID 19 since your procedure no  4.   Have you had any family members/close contacts diagnosed with the COVID 19 since your procedure?  no   If yes to any of these questions please route to Joylene John, RN and Joella Prince, RN

## 2020-08-19 ENCOUNTER — Encounter: Payer: Self-pay | Admitting: Gastroenterology

## 2020-08-23 MED ORDER — TELMISARTAN 20 MG PO TABS: 20.0000 mg | ORAL_TABLET | Freq: Every day | ORAL | 0 refills | Status: DC

## 2020-09-14 ENCOUNTER — Inpatient Hospital Stay: Payer: BC Managed Care – PPO | Attending: Oncology

## 2020-09-14 ENCOUNTER — Other Ambulatory Visit: Payer: Self-pay

## 2020-09-14 DIAGNOSIS — D509 Iron deficiency anemia, unspecified: Secondary | ICD-10-CM | POA: Insufficient documentation

## 2020-09-14 LAB — CBC WITH DIFFERENTIAL (CANCER CENTER ONLY)
Abs Immature Granulocytes: 0.03 10*3/uL (ref 0.00–0.07)
Basophils Absolute: 0 10*3/uL (ref 0.0–0.1)
Basophils Relative: 0 %
Eosinophils Absolute: 0 10*3/uL (ref 0.0–0.5)
Eosinophils Relative: 0 %
HCT: 43 % (ref 36.0–46.0)
Hemoglobin: 14.7 g/dL (ref 12.0–15.0)
Immature Granulocytes: 0 %
Lymphocytes Relative: 28 %
Lymphs Abs: 3.1 10*3/uL (ref 0.7–4.0)
MCH: 30.1 pg (ref 26.0–34.0)
MCHC: 34.2 g/dL (ref 30.0–36.0)
MCV: 87.9 fL (ref 80.0–100.0)
Monocytes Absolute: 0.7 10*3/uL (ref 0.1–1.0)
Monocytes Relative: 6 %
Neutro Abs: 7.5 10*3/uL (ref 1.7–7.7)
Neutrophils Relative %: 66 %
Platelet Count: 328 10*3/uL (ref 150–400)
RBC: 4.89 MIL/uL (ref 3.87–5.11)
RDW: 12.3 % (ref 11.5–15.5)
WBC Count: 11.3 10*3/uL — ABNORMAL HIGH (ref 4.0–10.5)
nRBC: 0 % (ref 0.0–0.2)

## 2020-09-15 LAB — FERRITIN: Ferritin: 20 ng/mL (ref 11–307)

## 2020-09-27 ENCOUNTER — Other Ambulatory Visit: Payer: Self-pay

## 2020-09-27 ENCOUNTER — Ambulatory Visit (AMBULATORY_SURGERY_CENTER): Payer: Self-pay | Admitting: *Deleted

## 2020-09-27 VITALS — Ht 63.0 in | Wt 218.0 lb

## 2020-09-27 DIAGNOSIS — D509 Iron deficiency anemia, unspecified: Secondary | ICD-10-CM

## 2020-09-27 NOTE — Progress Notes (Signed)
No egg or soy allergy known to patient   issues with past sedation with any surgeries or procedures of PONV- occ hard to wake post op  Patient denies ever being told they had issues or difficulty with intubation  No FH of Malignant Hyperthermia No diet pills per patient No home 02 use per patient  No blood thinners per patient  Pt denies issues with constipation  No A fib or A flutter  EMMI video to pt or via [place] 19 guidelines implemented in PV today with Pt and RN  Pt is fully vaccinated  for Covid   Due to the COVID-19 pandemic we are asking patients to follow certain guidelines.  Pt aware of COVID protocols and [facility] guidelines

## 2020-10-11 ENCOUNTER — Ambulatory Visit (AMBULATORY_SURGERY_CENTER): Payer: BC Managed Care – PPO | Admitting: Gastroenterology

## 2020-10-11 ENCOUNTER — Encounter: Payer: Self-pay | Admitting: Gastroenterology

## 2020-10-11 ENCOUNTER — Other Ambulatory Visit: Payer: Self-pay

## 2020-10-11 VITALS — BP 118/75 | HR 61 | Temp 98.9°F | Resp 18 | Ht 63.0 in | Wt 218.0 lb

## 2020-10-11 DIAGNOSIS — K3189 Other diseases of stomach and duodenum: Secondary | ICD-10-CM | POA: Diagnosis not present

## 2020-10-11 DIAGNOSIS — D509 Iron deficiency anemia, unspecified: Secondary | ICD-10-CM | POA: Diagnosis not present

## 2020-10-11 DIAGNOSIS — K219 Gastro-esophageal reflux disease without esophagitis: Secondary | ICD-10-CM | POA: Diagnosis not present

## 2020-10-11 DIAGNOSIS — K449 Diaphragmatic hernia without obstruction or gangrene: Secondary | ICD-10-CM

## 2020-10-11 DIAGNOSIS — K2990 Gastroduodenitis, unspecified, without bleeding: Secondary | ICD-10-CM | POA: Diagnosis not present

## 2020-10-11 MED ORDER — SODIUM CHLORIDE 0.9 % IV SOLN: 500.0000 mL | Freq: Once | INTRAVENOUS | Status: DC

## 2020-10-11 NOTE — Progress Notes (Signed)
Vs by CW Pt's states no medical or surgical changes since previsit or office visit.  

## 2020-10-11 NOTE — Progress Notes (Signed)
A and O x3. Report to RN. Tolerated MAC anesthesia well.Teeth unchanged after procedure. 

## 2020-10-11 NOTE — Progress Notes (Signed)
Called to room to assist during endoscopic procedure.  Patient ID and intended procedure confirmed with present staff. Received instructions for my participation in the procedure from the performing physician.  

## 2020-10-11 NOTE — Patient Instructions (Addendum)
Resume previous medications.  Await pathology for final recommendations.  Handouts on findings given to patient.     Limit use of ibuprofren, naproxen and other NSAIDS.  Can use 1-2 extra strength Tylenol every 8 hours as needed.   Avoid iron supplements given h/o hemochratosis  YOU HAD AN ENDOSCOPIC PROCEDURE TODAY AT [facility]:   Refer to the procedure report that was given to you for any specific questions about what was found during the examination.  If the procedure report does not answer your questions, please call your gastroenterologist to clarify.  If you requested that your care partner not be given the details of your procedure findings, then the procedure report has been included in a sealed envelope for you to review at your convenience later.  YOU SHOULD EXPECT: Some feelings of bloating in the abdomen. Passage of more gas than usual.  Walking can help get rid of the air that was put into your GI tract during the procedure and reduce the bloating. If you had a lower endoscopy (such as a colonoscopy or flexible sigmoidoscopy) you may notice spotting of blood in your stool or on the toilet paper. If you underwent a bowel prep for your procedure, you may not have a normal bowel movement for a few days.  Please Note:  You might notice some irritation and congestion in your nose or some drainage.  This is from the oxygen used during your procedure.  There is no need for concern and it should clear up in a day or so.  SYMPTOMS TO REPORT IMMEDIATELY:   Following upper endoscopy (EGD)  Vomiting of blood or coffee ground material  New chest pain or pain under the shoulder blades  Painful or persistently difficult swallowing  New shortness of breath  Fever of 100F or higher  Black, tarry-looking stools  For urgent or emergent issues, a gastroenterologist can be reached at any hour by calling [phone]. Do not use MyChart messaging for urgent concerns.    DIET:   We do recommend a small meal at first, but then you may proceed to your regular diet.  Drink plenty of fluids but you should avoid alcoholic beverages for 24 hours.  ACTIVITY:  You should plan to take it easy for the rest of today and you should NOT DRIVE or use heavy machinery until tomorrow (because of the sedation medicines used during the test).    FOLLOW UP: Our staff will call the number listed on your records 48-72 hours following your procedure to check on you and address any questions or concerns that you may have regarding the information given to you following your procedure. If we do not reach you, we will leave a message.  We will attempt to reach you two times.  During this call, we will ask if you have developed any symptoms of COVID 19. If you develop any symptoms (ie: fever, flu-like symptoms, shortness of breath, cough etc.) before then, please call [phone].  If you test positive for Covid 19 in the 2 weeks post procedure, please call and report this information to [place].    If any biopsies were taken you will be contacted by phone or by letter within the next 1-3 weeks.  Please call us at [phone] if you have not heard about the biopsies in 3 weeks.    SIGNATURES/CONFIDENTIALITY: You and/or your care partner have signed paperwork which will be entered into your electronic medical record.  These signatures attest  to the fact that that the information above on your After Visit Summary has been reviewed and is understood.  Full responsibility of the confidentiality of this discharge information lies with you and/or your care-partner.

## 2020-10-11 NOTE — Op Note (Signed)
[facility] Patient Name: Molly Quinn Procedure Date: 10/11/2020 3:44 PM MRN: [id number] Endoscopist: Mauri Pole , MD Age: 45 Referring MD:  Date of Birth: 1975/06/27 Gender: Female Account #: [id number] Procedure:                Upper GI endoscopy Indications:              Iron deficiency anemia due to suspected upper                            gastrointestinal bleeding Medicines:                Monitored Anesthesia Care Procedure:                Pre-Anesthesia Assessment:                           - Prior to the procedure, a History and Physical                            was performed, and patient medications and                            allergies were reviewed. The patient's tolerance of                            previous anesthesia was also reviewed. The risks                            and benefits of the procedure and the sedation                            options and risks were discussed with the patient.                            All questions were answered, and informed consent                            was obtained. Prior Anticoagulants: The patient has                            taken no previous anticoagulant or antiplatelet                            agents. ASA Grade Assessment: II - A patient with                            mild systemic disease. After reviewing the risks                            and benefits, the patient was deemed in                            satisfactory condition to undergo the procedure.  After obtaining informed consent, the endoscope was                            passed under direct vision. Throughout the                            procedure, the patient's blood pressure, pulse, and                            oxygen saturations were monitored continuously. The                            GIF [id number] was introduced through the                            mouth, and advanced to the  second part of duodenum.                            The upper GI endoscopy was accomplished without                            difficulty. The patient tolerated the procedure                            well. Scope In: Scope Out: Findings:                 A small hiatal hernia was present.                           The Z-line was regular and was found 36 cm from the                            incisors.                           Patchy mild inflammation characterized by                            congestion (edema), erosions and erythema was found                            in the entire examined stomach. Biopsies were taken                            with a cold forceps for Helicobacter pylori testing.                           Patchy mildly erythematous mucosa without active                            bleeding and with no stigmata of bleeding was found                            in the duodenal bulb. Biopsies  for histology were                            taken with a cold forceps for evaluation of celiac                            disease. Complications:            No immediate complications. Estimated Blood Loss:     Estimated blood loss was minimal. Impression:               - Small hiatal hernia.                           - Z-line regular, 36 cm from the incisors.                           - Gastritis. Biopsied.                           - Erythematous duodenopathy. Biopsied. Recommendation:           - Patient has a contact number available for                            emergencies. The signs and symptoms of potential                            delayed complications were discussed with the                            patient. Return to normal activities tomorrow.                            Written discharge instructions were provided to the                            patient.                           - Resume previous diet.                           - Continue present  medications.                           - Await pathology results.                           - Limit use of ibuprofen, naproxen, or other                            non-steroidal anti-inflammatory drugs.                           - Avoid iron supplements given h/o hemochrmatosis Mauri Pole, MD 10/11/2020 4:20:08 PM This report has been signed electronically.

## 2020-10-13 ENCOUNTER — Telehealth: Payer: Self-pay | Admitting: *Deleted

## 2020-10-13 NOTE — Telephone Encounter (Signed)
  Follow up Call-  Call back number 10/11/2020 08/16/2020  Post procedure Call Back phone  # [phone]  Permission to leave phone message Yes Yes  Some recent data might be hidden     Patient questions:  Do you have a fever, pain , or abdominal swelling? No. Pain Score  0 *  Have you tolerated food without any problems? Yes.    Have you been able to return to your normal activities? Yes.    Do you have any questions about your discharge instructions: Diet   No. Medications  No. Follow up visit  No.  Do you have questions or concerns about your Care? No.  Actions: * If pain score is 4 or above: No action needed, pain <4.

## 2020-10-27 ENCOUNTER — Other Ambulatory Visit: Payer: Self-pay | Admitting: Internal Medicine

## 2020-10-29 ENCOUNTER — Other Ambulatory Visit: Payer: Self-pay | Admitting: Internal Medicine

## 2020-11-02 ENCOUNTER — Encounter: Payer: Self-pay | Admitting: Gastroenterology

## 2020-12-06 ENCOUNTER — Other Ambulatory Visit: Payer: BC Managed Care – PPO

## 2020-12-06 ENCOUNTER — Ambulatory Visit: Payer: BC Managed Care – PPO | Admitting: Oncology

## 2020-12-07 ENCOUNTER — Other Ambulatory Visit: Payer: Self-pay

## 2020-12-07 ENCOUNTER — Inpatient Hospital Stay: Payer: BC Managed Care – PPO | Attending: Oncology

## 2020-12-07 ENCOUNTER — Inpatient Hospital Stay (HOSPITAL_BASED_OUTPATIENT_CLINIC_OR_DEPARTMENT_OTHER): Payer: BC Managed Care – PPO | Admitting: Oncology

## 2020-12-07 ENCOUNTER — Other Ambulatory Visit: Payer: Self-pay | Admitting: *Deleted

## 2020-12-07 DIAGNOSIS — Z79899 Other long term (current) drug therapy: Secondary | ICD-10-CM | POA: Diagnosis not present

## 2020-12-07 DIAGNOSIS — G40909 Epilepsy, unspecified, not intractable, without status epilepticus: Secondary | ICD-10-CM | POA: Diagnosis not present

## 2020-12-07 LAB — CBC WITH DIFFERENTIAL (CANCER CENTER ONLY)
Abs Immature Granulocytes: 0.02 10*3/uL (ref 0.00–0.07)
Basophils Absolute: 0 10*3/uL (ref 0.0–0.1)
Basophils Relative: 0 %
Eosinophils Absolute: 0 10*3/uL (ref 0.0–0.5)
Eosinophils Relative: 0 %
HCT: 40.8 % (ref 36.0–46.0)
Hemoglobin: 13.9 g/dL (ref 12.0–15.0)
Immature Granulocytes: 0 %
Lymphocytes Relative: 25 %
Lymphs Abs: 2 10*3/uL (ref 0.7–4.0)
MCH: 29.5 pg (ref 26.0–34.0)
MCHC: 34.1 g/dL (ref 30.0–36.0)
MCV: 86.6 fL (ref 80.0–100.0)
Monocytes Absolute: 0.4 10*3/uL (ref 0.1–1.0)
Monocytes Relative: 5 %
Neutro Abs: 5.4 10*3/uL (ref 1.7–7.7)
Neutrophils Relative %: 70 %
Platelet Count: 330 10*3/uL (ref 150–400)
RBC: 4.71 MIL/uL (ref 3.87–5.11)
RDW: 12.2 % (ref 11.5–15.5)
WBC Count: 7.9 10*3/uL (ref 4.0–10.5)
nRBC: 0 % (ref 0.0–0.2)

## 2020-12-07 LAB — FERRITIN: Ferritin: 15 ng/mL (ref 11–307)

## 2020-12-07 NOTE — Progress Notes (Signed)
  [facility] OFFICE PROGRESS NOTE   Diagnosis: Hemochromatosis  INTERVAL HISTORY:   Ms. Molly Quinn returns as scheduled.  She continues to have a menstrual cycle.  No other bleeding.  She underwent an upper endoscopy by Dr. Silverio Decamp on 10/11/2020.  Patchy mild inflammation was noted in the stomach with mild erythematous mucosa in the duodenum. A colonoscopy on 08/16/2020 revealed polyps in the descending and transverse colon.  The polyps were removed.  The pathology revealed tubular adenomas.  She reports a colonoscopy will be scheduled in 3 years.  No seizures. Objective:  Vital signs in last 24 hours:  Blood pressure 118/89, pulse 83, temperature 98.1 F (36.7 C), temperature source Oral, resp. rate 20, height 5\' 3"  (1.6 m), weight 218 lb 3.2 oz (99 kg), SpO2 98 %.    Resp: Lungs clear bilaterally Cardio: Regular rate and rhythm GI: No hepatosplenomegaly, no mass, no apparent ascites Vascular: No leg edema  Lab Results:  Lab Results  Component Value Date   WBC 7.9 12/07/2020   HGB 13.9 12/07/2020   HCT 40.8 12/07/2020   MCV 86.6 12/07/2020   PLT 330 12/07/2020   NEUTROABS 5.4 12/07/2020    CMP  Lab Results  Component Value Date   NA 137 01/04/2018   K 4.1 01/04/2018   CL 103 01/04/2018   CO2 27 01/04/2018   GLUCOSE 75 01/04/2018   BUN 12 01/04/2018   CREATININE 0.91 01/04/2018   CALCIUM 9.1 01/04/2018   PROT 6.7 01/04/2018   ALBUMIN 4.5 01/04/2018   AST 12 01/04/2018   ALT 16 01/04/2018   ALKPHOS 70 01/04/2018   BILITOT 0.5 01/04/2018   GFRNONAA >60 04/25/2017   GFRAA >60 04/25/2017    Medications: I have reviewed the patient's current medications.   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 History of an abnormal liver ultrasound-negative abdomen CT 04/09/2014 Status post cesarean section delivery of a female baby in August 2012.      Disposition: Molly Quinn appears stable.  The ferritin has been lower over the past few years.  She underwent a negative GI evaluation for source of blood loss by Dr. Silverio Decamp.  I suspect the low ferritin is related to ongoing menses.  She will return for a CBC and ferritin in 6 months.  She will be scheduled for an office visit in 1 year.  She will continue colonoscopy surveillance with Dr. Silverio Decamp.  Betsy Coder, MD  12/07/2020  1:02 PM

## 2020-12-09 ENCOUNTER — Other Ambulatory Visit: Payer: Self-pay | Admitting: Internal Medicine

## 2021-02-27 ENCOUNTER — Encounter: Payer: Self-pay | Admitting: Internal Medicine

## 2021-03-01 MED ORDER — TELMISARTAN 20 MG PO TABS
20.0000 mg | ORAL_TABLET | Freq: Every day | ORAL | 0 refills | Status: DC
Start: 2021-03-01 — End: 2021-03-04

## 2021-03-04 MED ORDER — TELMISARTAN 20 MG PO TABS: 20.0000 mg | ORAL_TABLET | Freq: Every day | ORAL | 0 refills | Status: DC

## 2021-03-04 NOTE — Addendum Note (Signed)
Addended by: Earnstine Regal on: 03/04/2021 10:28 AM   Modules accepted: Orders

## 2021-03-08 ENCOUNTER — Other Ambulatory Visit: Payer: Self-pay | Admitting: Internal Medicine

## 2021-03-09 ENCOUNTER — Encounter: Payer: Self-pay | Admitting: Internal Medicine

## 2021-03-12 ENCOUNTER — Other Ambulatory Visit: Payer: Self-pay | Admitting: Internal Medicine

## 2021-03-12 MED ORDER — VORTIOXETINE HBR 10 MG PO TABS: 10.0000 mg | ORAL_TABLET | Freq: Every day | ORAL | 0 refills | Status: DC

## 2021-04-11 ENCOUNTER — Ambulatory Visit (INDEPENDENT_AMBULATORY_CARE_PROVIDER_SITE_OTHER): Payer: BC Managed Care – PPO | Admitting: Internal Medicine

## 2021-04-11 ENCOUNTER — Other Ambulatory Visit: Payer: Self-pay

## 2021-04-11 ENCOUNTER — Encounter: Payer: Self-pay | Admitting: Internal Medicine

## 2021-04-11 VITALS — BP 124/82 | HR 86 | Temp 98.8°F | Ht 63.0 in | Wt 221.8 lb

## 2021-04-11 DIAGNOSIS — Z0000 Encounter for general adult medical examination without abnormal findings: Secondary | ICD-10-CM

## 2021-04-11 DIAGNOSIS — R739 Hyperglycemia, unspecified: Secondary | ICD-10-CM

## 2021-04-11 LAB — URINALYSIS
Bilirubin Urine: NEGATIVE
Hgb urine dipstick: NEGATIVE
Ketones, ur: NEGATIVE
Leukocytes,Ua: NEGATIVE
Nitrite: NEGATIVE
Specific Gravity, Urine: 1.02 (ref 1.000–1.030)
Total Protein, Urine: NEGATIVE
Urine Glucose: NEGATIVE
Urobilinogen, UA: 0.2 (ref 0.0–1.0)
pH: 6 (ref 5.0–8.0)

## 2021-04-11 LAB — CBC WITH DIFFERENTIAL/PLATELET
Basophils Absolute: 0 10*3/uL (ref 0.0–0.1)
Basophils Relative: 0.3 % (ref 0.0–3.0)
Eosinophils Absolute: 0 10*3/uL (ref 0.0–0.7)
Eosinophils Relative: 0.1 % (ref 0.0–5.0)
HCT: 41.3 % (ref 36.0–46.0)
Hemoglobin: 13.6 g/dL (ref 12.0–15.0)
Lymphocytes Relative: 29.6 % (ref 12.0–46.0)
Lymphs Abs: 2.8 10*3/uL (ref 0.7–4.0)
MCHC: 32.9 g/dL (ref 30.0–36.0)
MCV: 86.7 fl (ref 78.0–100.0)
Monocytes Absolute: 0.6 10*3/uL (ref 0.1–1.0)
Monocytes Relative: 5.9 % (ref 3.0–12.0)
Neutro Abs: 6 10*3/uL (ref 1.4–7.7)
Neutrophils Relative %: 64.1 % (ref 43.0–77.0)
Platelets: 309 10*3/uL (ref 150.0–400.0)
RBC: 4.76 Mil/uL (ref 3.87–5.11)
RDW: 13.7 % (ref 11.5–15.5)
WBC: 9.4 10*3/uL (ref 4.0–10.5)

## 2021-04-11 LAB — COMPREHENSIVE METABOLIC PANEL
ALT: 27 U/L (ref 0–35)
AST: 17 U/L (ref 0–37)
Albumin: 4.4 g/dL (ref 3.5–5.2)
Alkaline Phosphatase: 80 U/L (ref 39–117)
BUN: 12 mg/dL (ref 6–23)
CO2: 27 mEq/L (ref 19–32)
Calcium: 9.4 mg/dL (ref 8.4–10.5)
Chloride: 101 mEq/L (ref 96–112)
Creatinine, Ser: 0.84 mg/dL (ref 0.40–1.20)
GFR: 83.11 mL/min (ref >60.00)
Glucose, Bld: 121 mg/dL — ABNORMAL HIGH (ref 70–99)
Potassium: 3.9 mEq/L (ref 3.5–5.1)
Sodium: 137 mEq/L (ref 135–145)
Total Bilirubin: 0.4 mg/dL (ref 0.2–1.2)
Total Protein: 6.7 g/dL (ref 6.0–8.3)

## 2021-04-11 LAB — HEMOGLOBIN A1C: Hgb A1c MFr Bld: 6.2 % (ref 4.6–6.5)

## 2021-04-11 LAB — LDL CHOLESTEROL, DIRECT: Direct LDL: 164 mg/dL

## 2021-04-11 LAB — TSH: TSH: 1.96 u[IU]/mL (ref 0.35–5.50)

## 2021-04-11 LAB — LIPID PANEL
Cholesterol: 239 mg/dL — ABNORMAL HIGH (ref 0–200)
HDL: 41.3 mg/dL (ref >39.00)
NonHDL: 197.81
Total CHOL/HDL Ratio: 6
Triglycerides: 313 mg/dL — ABNORMAL HIGH (ref 0.0–149.0)
VLDL: 62.6 mg/dL — ABNORMAL HIGH (ref 0.0–40.0)

## 2021-04-11 MED ORDER — DEXILANT 60 MG PO CPDR: 1.0000 | DELAYED_RELEASE_CAPSULE | Freq: Every day | ORAL | 3 refills | Status: DC

## 2021-04-11 MED ORDER — ALPRAZOLAM 0.5 MG PO TABS: 0.5000 mg | ORAL_TABLET | Freq: Two times a day (BID) | ORAL | 1 refills | Status: DC | PRN

## 2021-04-11 MED ORDER — TELMISARTAN 40 MG PO TABS: 40.0000 mg | ORAL_TABLET | Freq: Every day | ORAL | 3 refills | Status: DC

## 2021-04-11 NOTE — Assessment & Plan Note (Addendum)
Prediabetes.  Check A1c and glucose. Ozempic option will be discussed

## 2021-04-11 NOTE — Progress Notes (Signed)
Subjective:  Patient ID: Molly Quinn, female    DOB: 23-Sep-1975  Age: 46 y.o. MRN: [id number]  CC: Follow-up and Medication Refill (On all meds, also want to now will MD take over [place])   HPI Molly Quinn presents for HTN, anxiety.  Her mother had a stroke a few months ago. Well exam    Outpatient Medications Prior to Visit  Medication Sig Dispense Refill   Calcium Carbonate-Vitamin D (CALCIUM 600+D HIGH POTENCY PO) Take 1 tablet by mouth daily. 600mg /64mcg     cholecalciferol (VITAMIN D3) 25 MCG (1000 UNIT) tablet Take 1,000 Units by mouth daily.     LAMICTAL XR 100 MG TB24 24 hour tablet Take 300 mg by mouth at bedtime.  0   Omega-3 Fatty Acids (FISH OIL PO) Take 1,400 mg by mouth daily.     TRINTELLIX 10 MG TABS tablet TAKE 1 TABLET BY MOUTH EVERY DAY 90 tablet 0   ALPRAZolam (XANAX) 0.5 MG tablet Take 1-2 tablets (0.5-1 mg total) by mouth 2 (two) times daily as needed for anxiety (for flight anxiety). 30 tablet 1   DEXILANT 60 MG capsule Take 1 capsule by mouth daily.     telmisartan (MICARDIS) 20 MG tablet Take 1 tablet (20 mg total) by mouth daily. Overdue for Annual appt must see provider for future refills 30 tablet 0   vortioxetine HBr (TRINTELLIX) 10 MG TABS tablet Take 1 tablet (10 mg total) by mouth daily. 7 tablet 0   0.9 %  sodium chloride infusion      No facility-administered medications prior to visit.    ROS: Review of Systems  Constitutional:  Negative for activity change, appetite change, chills, fatigue and unexpected weight change.  HENT:  Negative for congestion, mouth sores and sinus pressure.   Eyes:  Negative for visual disturbance.  Respiratory:  Negative for cough and chest tightness.   Gastrointestinal:  Negative for abdominal pain and nausea.  Genitourinary:  Negative for difficulty urinating, frequency and vaginal pain.  Musculoskeletal:  Negative for back pain and gait problem.  Skin:  Negative for pallor and rash.  Neurological:   Negative for dizziness, tremors, weakness, numbness and headaches.  Psychiatric/Behavioral:  Negative for confusion and sleep disturbance.    Objective:  BP 124/82 (BP Location: Left Arm)    Pulse 86    Temp 98.8 F (37.1 C) (Oral)    Ht 5\' 3"  (1.6 m)    Wt 221 lb 12.8 oz (100.6 kg)    SpO2 97%    BMI 39.29 kg/m   BP Readings from Last 3 Encounters:  04/11/21 124/82  12/07/20 118/89  10/11/20 118/75    Wt Readings from Last 3 Encounters:  04/11/21 221 lb 12.8 oz (100.6 kg)  12/07/20 218 lb 3.2 oz (99 kg)  10/11/20 218 lb (98.9 kg)    Physical Exam Constitutional:      General: She is not in acute distress.    Appearance: She is well-developed.  HENT:     Head: Normocephalic.     Right Ear: External ear normal.     Left Ear: External ear normal.     Nose: Nose normal.  Eyes:     General:        Right eye: No discharge.        Left eye: No discharge.     Conjunctiva/sclera: Conjunctivae normal.     Pupils: Pupils are equal, round, and reactive to light.  Neck:     Thyroid:  No thyromegaly.     Vascular: No JVD.     Trachea: No tracheal deviation.  Cardiovascular:     Rate and Rhythm: Normal rate and regular rhythm.     Heart sounds: Normal heart sounds.  Pulmonary:     Effort: No respiratory distress.     Breath sounds: No stridor. No wheezing.  Abdominal:     General: Bowel sounds are normal. There is no distension.     Palpations: Abdomen is soft. There is no mass.     Tenderness: There is no abdominal tenderness. There is no guarding or rebound.  Musculoskeletal:        General: No tenderness.     Cervical back: Normal range of motion and neck supple. No rigidity.  Lymphadenopathy:     Cervical: No cervical adenopathy.  Skin:    Findings: No erythema or rash.  Neurological:     Mental Status: She is oriented to person, place, and time.     Cranial Nerves: No cranial nerve deficit.     Motor: No abnormal muscle tone.     Coordination: Coordination normal.      Deep Tendon Reflexes: Reflexes normal.  Psychiatric:        Behavior: Behavior normal.        Thought Content: Thought content normal.        Judgment: Judgment normal.    Lab Results  Component Value Date   WBC 7.9 12/07/2020   HGB 13.9 12/07/2020   HCT 40.8 12/07/2020   PLT 330 12/07/2020   GLUCOSE 75 01/04/2018   CHOL 185 01/04/2018   TRIG 201.0 (H) 01/04/2018   HDL 40.40 01/04/2018   LDLDIRECT 133.0 01/04/2018   LDLCALC 144 (H) 02/09/2017   ALT 16 01/04/2018   AST 12 01/04/2018   NA 137 01/04/2018   K 4.1 01/04/2018   CL 103 01/04/2018   CREATININE 0.91 01/04/2018   BUN 12 01/04/2018   CO2 27 01/04/2018   TSH 2.12 01/04/2018   HGBA1C 5.1 05/01/2017    US PELVIC COMPLETE WITH TRANSVAGINAL  Result Date: 05/21/2020 CLINICAL DATA:  Pelvic pain EXAM: TRANSABDOMINAL AND TRANSVAGINAL ULTRASOUND OF PELVIS TECHNIQUE: Both transabdominal and transvaginal ultrasound examinations of the pelvis were performed. Transabdominal technique was performed for global imaging of the pelvis including uterus, ovaries, adnexal regions, and pelvic cul-de-sac. It was necessary to proceed with endovaginal exam following the transabdominal exam to visualize the uterus, endometrium, ovaries and adnexa. COMPARISON:  None FINDINGS: Uterus Measurements: 11.5 x 5.2 x 6.2 cm = volume: 197 mL. At least 3 fibroids noted, the largest a left posterior fibroid measuring up to 2.7 cm. Endometrium Thickness: 7 mm in thickness.  No focal abnormality visualized. Right ovary Measurements: 1.9 x 1.5 x 2.1 cm = volume: 3.1 mL. Normal appearance/no adnexal mass. Left ovary Measurements: 3.2 x 1.6 x 2.9 cm = volume: 8.0 mL. Normal appearance/no adnexal mass. Other findings No abnormal free fluid. IMPRESSION: Fibroid uterus. No acute findings. Electronically Signed   By: Rolm Baptise M.D.   On: 05/21/2020 10:57    Assessment & Plan:   Problem List Items Addressed This Visit     Hyperglycemia    Check A1c       Relevant Medications   telmisartan (MICARDIS) 40 MG tablet   DEXILANT 60 MG capsule   Other Relevant Orders   Hemoglobin A1c   Well adult exam - Primary   Relevant Orders   TSH   Urinalysis   CBC with Differential/Platelet  Lipid panel   Comprehensive metabolic panel   Hemoglobin A1c      Meds ordered this encounter  Medications   telmisartan (MICARDIS) 40 MG tablet    Sig: Take 1 tablet (40 mg total) by mouth daily.    Dispense:  90 tablet    Refill:  3   DEXILANT 60 MG capsule    Sig: Take 1 capsule (60 mg total) by mouth daily.    Dispense:  90 capsule    Refill:  3   ALPRAZolam (XANAX) 0.5 MG tablet    Sig: Take 1-2 tablets (0.5-1 mg total) by mouth 2 (two) times daily as needed for anxiety (for flight anxiety).    Dispense:  30 tablet    Refill:  1      Follow-up: Return in about 6 months (around 10/09/2021) for a follow-up visit.  [name], MD

## 2021-04-13 ENCOUNTER — Encounter: Payer: Self-pay | Admitting: Internal Medicine

## 2021-05-05 ENCOUNTER — Encounter: Payer: Self-pay | Admitting: Oncology

## 2021-05-12 ENCOUNTER — Ambulatory Visit (INDEPENDENT_AMBULATORY_CARE_PROVIDER_SITE_OTHER): Payer: BC Managed Care – PPO | Admitting: Nurse Practitioner

## 2021-05-12 ENCOUNTER — Other Ambulatory Visit: Payer: Self-pay

## 2021-05-12 VITALS — BP 122/88 | HR 82 | Temp 98.2°F | Ht 63.0 in | Wt 218.0 lb

## 2021-05-12 DIAGNOSIS — J069 Acute upper respiratory infection, unspecified: Secondary | ICD-10-CM | POA: Diagnosis not present

## 2021-05-12 DIAGNOSIS — K219 Gastro-esophageal reflux disease without esophagitis: Secondary | ICD-10-CM

## 2021-05-12 MED ORDER — BENZONATATE 100 MG PO CAPS: 100.0000 mg | ORAL_CAPSULE | Freq: Two times a day (BID) | ORAL | 0 refills | Status: DC | PRN

## 2021-05-12 MED ORDER — FLUTICASONE PROPIONATE 50 MCG/ACT NA SUSP: 2.0000 | Freq: Every day | NASAL | 2 refills | Status: DC

## 2021-05-12 MED ORDER — DEXILANT 60 MG PO CPDR: 1.0000 | DELAYED_RELEASE_CAPSULE | Freq: Every day | ORAL | 0 refills | Status: DC

## 2021-05-12 NOTE — Assessment & Plan Note (Signed)
At this time I think she is dealing with a viral upper respiratory infection.  We will treat symptomatically.  I recommend she get over-the-counter antihistamine she can also consider decongestant component of this.  Take this per instructions on box.  We will also provide Tessalon Perles that she can take as needed for cough suppression and Flonase nasal spray.  She was encouraged to call the office next week if symptoms persist or worsen. ?

## 2021-05-12 NOTE — Progress Notes (Signed)
? ? ? ?Subjective:  ?Patient ID: Molly Quinn, female    DOB: 1975-05-06  Age: 46 y.o. MRN: [id number] ? ?CC:  ?Chief Complaint  ?Patient presents with  ? Cough  ?  Congestion x 8 days   ?  ? ? ?HPI  ?This patient arrives today for the above. ? ?Symptoms started 8 days ago.  She is taken Mucinex over-the-counter without improvement in her symptoms.  She is also noticed some nasal congestion.  She tells me her daughter was recently diagnosed with a double ear infection and also seems to have similar symptoms.  She tells me overall her symptoms are improving but her cough is still present and this is bothering her.  She denies any shortness of breath.  She denies fever.  She has occasional sputum production.  Tells me the wheezing is improving. ? ?She is requesting refill on her Dexilant that she takes for GERD. ? ?Past Medical History:  ?Diagnosis Date  ? Allergy   ? Anxiety   ? Depression   ? Epilepsy ([place]) dx 1991  ? no breakthru while on meds, last in 1994- no sz in > 20 yrs  ? Epilepsy ([place])   ? GERD (gastroesophageal reflux disease)   ? Hemochromatosis dx 03/2005 in [place]  ? phlebotomy prn - 1x/yr or less, cbc q37mo  ? Hyperlipidemia   ? Hypertension   ? IBS (irritable bowel syndrome)   ? pt states never ben told this  ? PONV (postoperative nausea and vomiting)   ? ? ? ? ?Family History  ?Problem Relation Age of Onset  ? Stroke Mother   ?     45  ? Ulcerative colitis Mother   ? Hypertension Mother   ? Hyperlipidemia Mother   ? Crohn's disease Mother   ? Hypertension Father   ? Hyperlipidemia Father   ? Melanoma Father 34  ? Cancer Brother   ? Ulcerative colitis Brother   ?     coloectomy age 4  ? Other Brother   ? Hypertension Brother   ? Hemochromatosis Brother   ? Other Brother   ?     PSC  ? Other Brother   ?     bile duct cancer  ? Heart disease Maternal Uncle   ? Prostate cancer Maternal Uncle   ? Colon polyps Maternal Uncle   ? Ovarian cancer Paternal Aunt   ? Cancer Paternal Aunt   ?     ovar ca  ?  Diabetes Maternal Grandmother   ? Heart disease Maternal Grandmother   ? Breast cancer Paternal Grandmother   ? Colon cancer Neg Hx   ? Esophageal cancer Neg Hx   ? Rectal cancer Neg Hx   ? Stomach cancer Neg Hx   ? ? ?Social History  ? ?Social History Narrative  ? Not on file  ? ?Social History  ? ?Tobacco Use  ? Smoking status: Never  ? Smokeless tobacco: Never  ?Substance Use Topics  ? Alcohol use: Yes  ?  Comment: rare  ? ? ? ?Current Meds  ?Medication Sig  ? ALPRAZolam (XANAX) 0.5 MG tablet Take 1-2 tablets (0.5-1 mg total) by mouth 2 (two) times daily as needed for anxiety (for flight anxiety).  ? benzonatate (TESSALON) 100 MG capsule Take 1 capsule (100 mg total) by mouth 2 (two) times daily as needed for cough.  ? Calcium Carbonate-Vitamin D (CALCIUM 600+D HIGH POTENCY PO) Take 1 tablet by mouth daily. 600mg /38mcg  ? cholecalciferol (VITAMIN  D3) 25 MCG (1000 UNIT) tablet Take 1,000 Units by mouth daily.  ? fluticasone (FLONASE) 50 MCG/ACT nasal spray Place 2 sprays into both nostrils daily.  ? LAMICTAL XR 100 MG TB24 24 hour tablet Take 300 mg by mouth at bedtime.  ? Omega-3 Fatty Acids (FISH OIL PO) Take 1,400 mg by mouth daily.  ? telmisartan (MICARDIS) 40 MG tablet Take 1 tablet (40 mg total) by mouth daily.  ? TRINTELLIX 10 MG TABS tablet TAKE 1 TABLET BY MOUTH EVERY DAY  ? [DISCONTINUED] DEXILANT 60 MG capsule Take 1 capsule (60 mg total) by mouth daily.  ? ? ?ROS:  ?Review of Systems  ?Constitutional:  Negative for chills and fever.  ?HENT:  Positive for congestion.   ?Respiratory:  Positive for cough, sputum production (occasional) and wheezing. Negative for shortness of breath.   ?Cardiovascular:  Negative for chest pain.  ?Musculoskeletal:  Positive for myalgias (first couple of days now better).  ?Neurological:  Negative for headaches.  ? ? ?Objective:  ? ?Today's Vitals: BP 122/88   Pulse 82   Temp 98.2 ?F (36.8 ?C) (Oral)   Ht 5\' 3"  (1.6 m)   Wt 218 lb (98.9 kg)   SpO2 96%   BMI 38.62 kg/m?   ?Vitals with BMI 05/12/2021 04/11/2021 12/07/2020  ?Height 5\' 3"  5\' 3"  5\' 3"   ?Weight 218 lbs 221 lbs 13 oz 218 lbs 3 oz  ?BMI 38.63 39.3 38.66  ?Systolic 562 130 865  ?Diastolic 88 82 89  ?Pulse 82 86 83  ?  ? ?Physical Exam ?Vitals reviewed.  ?Constitutional:   ?   General: She is not in acute distress. ?   Appearance: Normal appearance.  ?HENT:  ?   Head: Normocephalic and atraumatic.  ?   Right Ear: Hearing, tympanic membrane, ear canal and external ear normal.  ?   Left Ear: Hearing, tympanic membrane, ear canal and external ear normal.  ?Neck:  ?   Vascular: No carotid bruit.  ?Cardiovascular:  ?   Rate and Rhythm: Normal rate and regular rhythm.  ?   Pulses: Normal pulses.  ?   Heart sounds: Normal heart sounds.  ?Pulmonary:  ?   Effort: Pulmonary effort is normal.  ?   Breath sounds: Normal breath sounds.  ?Skin: ?   General: Skin is warm and dry.  ?Neurological:  ?   General: No focal deficit present.  ?   Mental Status: She is alert and oriented to person, place, and time.  ?Psychiatric:     ?   Mood and Affect: Mood normal.     ?   Behavior: Behavior normal.     ?   Judgment: Judgment normal.  ? ? ? ? ? ? ? ?Assessment and Plan  ? ?1. URI, acute   ?2. Gastroesophageal reflux disease, unspecified whether esophagitis present   ? ? ? ?Plan: ?See plan via problem list below. ?Dexilant refilled. ? ? ?Tests ordered ?No orders of the defined types were placed in this encounter. ? ? ? ? ?Meds ordered this encounter  ?Medications  ? DEXILANT 60 MG capsule  ?  Sig: Take 1 capsule (60 mg total) by mouth daily.  ?  Dispense:  90 capsule  ?  Refill:  0  ? fluticasone (FLONASE) 50 MCG/ACT nasal spray  ?  Sig: Place 2 sprays into both nostrils daily.  ?  Dispense:  16 g  ?  Refill:  2  ?  Order Specific Question:   Supervising  Provider  ?  Answer:   Binnie Rail [[id number]]  ? benzonatate (TESSALON) 100 MG capsule  ?  Sig: Take 1 capsule (100 mg total) by mouth 2 (two) times daily as needed for cough.  ?  Dispense:  20  capsule  ?  Refill:  0  ?  Order Specific Question:   Supervising Provider  ?  Answer:   Binnie Rail [[id number]]  ? ? ?Patient to follow-up if symptoms persist or worsen. ? ?Ailene Ards, NP ? ?

## 2021-05-17 DIAGNOSIS — H43813 Vitreous degeneration, bilateral: Secondary | ICD-10-CM | POA: Diagnosis not present

## 2021-05-17 DIAGNOSIS — H02844 Edema of left upper eyelid: Secondary | ICD-10-CM | POA: Diagnosis not present

## 2021-05-17 DIAGNOSIS — H02845 Edema of left lower eyelid: Secondary | ICD-10-CM | POA: Diagnosis not present

## 2021-05-17 DIAGNOSIS — H02205 Unspecified lagophthalmos left lower eyelid: Secondary | ICD-10-CM | POA: Diagnosis not present

## 2021-06-05 ENCOUNTER — Other Ambulatory Visit: Payer: Self-pay | Admitting: Internal Medicine

## 2021-06-06 ENCOUNTER — Other Ambulatory Visit: Payer: Self-pay | Admitting: Internal Medicine

## 2021-06-06 ENCOUNTER — Inpatient Hospital Stay: Payer: BC Managed Care – PPO | Attending: Oncology

## 2021-08-15 ENCOUNTER — Ambulatory Visit: Payer: Self-pay

## 2021-08-15 ENCOUNTER — Ambulatory Visit (INDEPENDENT_AMBULATORY_CARE_PROVIDER_SITE_OTHER): Payer: BC Managed Care – PPO | Admitting: Family Medicine

## 2021-08-15 ENCOUNTER — Encounter: Payer: Self-pay | Admitting: Family Medicine

## 2021-08-15 ENCOUNTER — Ambulatory Visit (INDEPENDENT_AMBULATORY_CARE_PROVIDER_SITE_OTHER): Payer: BC Managed Care – PPO

## 2021-08-15 VITALS — BP 122/82 | HR 92 | Ht 63.0 in | Wt 222.0 lb

## 2021-08-15 DIAGNOSIS — M25561 Pain in right knee: Secondary | ICD-10-CM | POA: Diagnosis not present

## 2021-08-15 DIAGNOSIS — M1711 Unilateral primary osteoarthritis, right knee: Secondary | ICD-10-CM | POA: Diagnosis not present

## 2021-08-15 NOTE — Progress Notes (Signed)
    Subjective:    CC: R knee pain  I, Molly Weber, LAT, ATC, am serving as scribe for Dr. Lynne Leader.  HPI: Pt is a 46 y/o female c/o R knee pain since early Sunday morning w/ no known MOI.  She was moving furniture on Saturday but does not recall any injury. Pt locates pain to her R lateral knee.  R knee swelling: no R knee mechanical symptoms: yes but nothing different than usual Aggravating factors: weight-bearing activity; walking Treatments tried: crutches  Pertinent review of Systems: No fevers or chills  Relevant historical information: Hypertension   Objective:    Vitals:   08/15/21 1508  BP: 122/82  Pulse: 92  SpO2: 95%   General: Well Developed, well nourished, and in no acute distress.   MSK: Right knee: Normal appearing Normal motion. Tender palpation lateral joint line mildly. Stable ligamentous exam. Positive lateral McMurray's test. Positive Thessaly's test Intact strength.   Lab and Radiology Results  Diagnostic Limited MSK Ultrasound of: Right knee Quad tendon intact normal. Patellar tendon normal. Lateral joint line degenerative appearing lateral meniscus. Medial joint line largely normal.  Medial meniscus. Posterior knee no Baker's cyst. Impression: Degeneration lateral meniscus.  X-ray images right knee obtained today personally and independently interpreted Small osteophyte superior patellar pole.  Minimal degenerative changes.  No acute fractures. Await formal radiology review   Impression and Recommendations:    Assessment and Plan: 46 y.o. female with right knee pain at lateral joint line concerning for degenerative lateral meniscus tear.  We discussed options.  She would like to avoid a steroid injection for now which is reasonable.  Plan for hinged knee brace Voltaren gel and a bit of watchful waiting.  If not improving with a basic quad strengthening as well will check back in 6 weeks and proceed to MRI.  Based on mechanical  symptoms if needed certainly could get an MRI sooner.  Could proceed to steroid injection sooner if needed as well.Marland Kitchen  PDMP not reviewed this encounter. Orders Placed This Encounter  Procedures   [place] LIMITED JOINT SPACE STRUCTURES LOW RIGHT(NO LINKED CHARGES)    Order Specific Question:   Reason for Exam (SYMPTOM  OR DIAGNOSIS REQUIRED)    Answer:   R knee pain    Order Specific Question:   Preferred imaging location?    Answer:   [facility]   DG Knee AP/LAT W/[facility] Right    Standing Status:   Future    Number of Occurrences:   1    Standing Expiration Date:   09/14/2021    Order Specific Question:   Reason for Exam (SYMPTOM  OR DIAGNOSIS REQUIRED)    Answer:   R knee pain    Order Specific Question:   Is patient pregnant?    Answer:   No    Order Specific Question:   Preferred imaging location?    Answer:   [facility]   No orders of the defined types were placed in this encounter.   Discussed warning signs or symptoms. Please see discharge instructions. Patient expresses understanding.   The above documentation has been reviewed and is accurate and complete Lynne Leader, M.D.

## 2021-08-15 NOTE — Patient Instructions (Addendum)
Nice to meet you today.  Please get an Xray today before you leave.  Please use Voltaren gel (Generic Diclofenac Gel) up to 4x daily for pain as needed.  This is available over-the-counter as both the name brand Voltaren gel and the generic diclofenac gel.   Please get a hinged knee brace at [facility] supply.  Follow-up: 6 weeks

## 2021-08-17 NOTE — Progress Notes (Signed)
Right knee x-ray shows some arthritis changes.  No fractures are present.

## 2021-09-02 ENCOUNTER — Other Ambulatory Visit: Payer: Self-pay | Admitting: Internal Medicine

## 2021-09-26 ENCOUNTER — Ambulatory Visit: Payer: BC Managed Care – PPO | Admitting: Family Medicine

## 2021-11-30 ENCOUNTER — Encounter: Payer: Self-pay | Admitting: Internal Medicine

## 2021-12-07 ENCOUNTER — Inpatient Hospital Stay: Payer: BC Managed Care – PPO | Attending: Internal Medicine

## 2021-12-07 ENCOUNTER — Encounter: Payer: Self-pay | Admitting: *Deleted

## 2021-12-07 ENCOUNTER — Inpatient Hospital Stay: Payer: BC Managed Care – PPO | Admitting: Oncology

## 2021-12-07 NOTE — Progress Notes (Signed)
"  No show" for lab/OV on 12/07/21. Scheduling message sent to reschedule in 1 month.

## 2021-12-15 ENCOUNTER — Telehealth: Payer: Self-pay | Admitting: Oncology

## 2021-12-15 NOTE — Telephone Encounter (Signed)
Attempted to contact patient in regards to rescheduling missed appointments on 9/27. No answer so voicemail was left for patient to call back

## 2022-02-08 DIAGNOSIS — Z1231 Encounter for screening mammogram for malignant neoplasm of breast: Secondary | ICD-10-CM | POA: Diagnosis not present

## 2022-02-08 DIAGNOSIS — Z01419 Encounter for gynecological examination (general) (routine) without abnormal findings: Secondary | ICD-10-CM | POA: Diagnosis not present

## 2022-02-08 DIAGNOSIS — Z0000 Encounter for general adult medical examination without abnormal findings: Secondary | ICD-10-CM | POA: Diagnosis not present

## 2022-02-14 ENCOUNTER — Ambulatory Visit (INDEPENDENT_AMBULATORY_CARE_PROVIDER_SITE_OTHER): Payer: BC Managed Care – PPO | Admitting: Internal Medicine

## 2022-02-14 ENCOUNTER — Encounter: Payer: Self-pay | Admitting: Internal Medicine

## 2022-02-14 VITALS — BP 124/72 | HR 70 | Temp 98.0°F | Ht 63.0 in | Wt 221.0 lb

## 2022-02-14 DIAGNOSIS — E7889 Other lipoprotein metabolism disorders: Secondary | ICD-10-CM | POA: Insufficient documentation

## 2022-02-14 DIAGNOSIS — E785 Hyperlipidemia, unspecified: Secondary | ICD-10-CM

## 2022-02-14 DIAGNOSIS — F418 Other specified anxiety disorders: Secondary | ICD-10-CM

## 2022-02-14 DIAGNOSIS — F339 Major depressive disorder, recurrent, unspecified: Secondary | ICD-10-CM

## 2022-02-14 DIAGNOSIS — E119 Type 2 diabetes mellitus without complications: Secondary | ICD-10-CM

## 2022-02-14 DIAGNOSIS — R739 Hyperglycemia, unspecified: Secondary | ICD-10-CM

## 2022-02-14 DIAGNOSIS — I10 Essential (primary) hypertension: Secondary | ICD-10-CM

## 2022-02-14 DIAGNOSIS — G40B09 Juvenile myoclonic epilepsy, not intractable, without status epilepticus: Secondary | ICD-10-CM | POA: Insufficient documentation

## 2022-02-14 MED ORDER — METFORMIN HCL 500 MG PO TABS: 500.0000 mg | ORAL_TABLET | Freq: Every day | ORAL | 3 refills | Status: DC

## 2022-02-14 NOTE — Patient Instructions (Signed)
Sign up for [employer] ( via [employer] on your phone or your ipad). If you don't have a Art therapist card  - go to [employer] branch. They will set you up in 15 minutes. It is free.    The Obesity Code book by Sharman Cheek   These suggestions will probably help you to improve your metabolism if you are not overweight and to lose weight if you are overweight: 1.  Reduce your consumption of sugars and starches.  Eliminate high fructose corn syrup from your diet.  Reduce your consumption of processed foods.  For desserts try to have seasonal fruits, berries, nuts, cheeses or dark chocolate with more than 70% cacao. 2.  Do not snack 3.  You do not have to eat breakfast.  If you choose to have breakfast - eat plain greek yogurt, eggs, oatmeal (without sugar) - use honey if you need to. 4.  Drink water, freshly brewed unsweetened tea (green, black or herbal) or coffee.  Do not drink sodas including diet sodas , juices, beverages sweetened with artificial sweeteners. 5.  Reduce your consumption of refined grains. 6.  Avoid protein drinks such as Optifast, Slim fast etc. Eat chicken, fish, meat, dairy and beans for your sources of protein. 7.  Natural unprocessed fats like cold pressed virgin olive oil, butter, coconut oil are good for you.  Eat avocados. 8.  Increase your consumption of fiber.  Fruits, berries, vegetables, whole grains, flaxseed, chia seeds, beans, popcorn, nuts, oatmeal are good sources of fiber 9.  Use vinegar in your diet, i.e. apple cider vinegar, red wine or balsamic vinegar 10.  You can try fasting.  For example you can skip breakfast and lunch every other day (24-hour fast) 11.  Stress reduction, good night sleep, relaxation, meditation, yoga and other physical activity is likely to help you to maintain low weight too. 12.  If you drink alcohol, limit your alcohol intake to no more than 2 drinks a day.

## 2022-02-14 NOTE — Progress Notes (Addendum)
Subjective:  Patient ID: Molly Quinn, female    DOB: Jul 17, 1975  Age: 46 y.o. MRN: [id number]  CC: Follow-up (Go over labs from obgyn)   HPI Molly Quinn presents for elevated glucose 128, A1c 6.5% per patient.  Molly Quinn is going through separation. Follow-up on hypertension, depression  Outpatient Medications Prior to Visit  Medication Sig Dispense Refill   ALPRAZolam (XANAX) 0.5 MG tablet Take 1-2 tablets (0.5-1 mg total) by mouth 2 (two) times daily as needed for anxiety (for flight anxiety). 30 tablet 1   Calcium Carbonate-Vitamin D (CALCIUM 600+D HIGH POTENCY PO) Take 1 tablet by mouth daily. '600mg'$ /54mg     cholecalciferol (VITAMIN D3) 25 MCG (1000 UNIT) tablet Take 1,000 Units by mouth daily.     DEXILANT 60 MG capsule Take 1 capsule (60 mg total) by mouth daily. 90 capsule 0   LAMICTAL XR 100 MG TB24 24 hour tablet Take 300 mg by mouth at bedtime.  0   Omega-3 Fatty Acids (FISH OIL PO) Take 1,400 mg by mouth daily.     Tdap (BOOSTRIX) 5-2.5-18.5 LF-MCG/0.5 injection inject 0.5 milliliter intramuscularly     telmisartan (MICARDIS) 40 MG tablet Take 1 tablet (40 mg total) by mouth daily. 90 tablet 3   vortioxetine HBr (TRINTELLIX) 10 MG TABS tablet TAKE 1 TABLET BY MOUTH EVERY DAY 90 tablet 1   benzonatate (TESSALON) 100 MG capsule Take 1 capsule (100 mg total) by mouth 2 (two) times daily as needed for cough. 20 capsule 0   fluticasone (FLONASE) 50 MCG/ACT nasal spray Place 2 sprays into both nostrils daily. 16 g 2   telmisartan (MICARDIS) 20 MG tablet TAKE 1 TABLET(20 MG) BY MOUTH DAILY 90 tablet 1   No facility-administered medications prior to visit.    ROS: Review of Systems  Constitutional:  Negative for activity change, appetite change, chills, fatigue and unexpected weight change.  HENT:  Negative for congestion, mouth sores and sinus pressure.   Eyes:  Negative for visual disturbance.  Respiratory:  Negative for cough and chest tightness.   Gastrointestinal:   Negative for abdominal pain and nausea.  Genitourinary:  Negative for difficulty urinating, frequency and vaginal pain.  Musculoskeletal:  Negative for back pain and gait problem.  Skin:  Negative for pallor and rash.  Neurological:  Negative for dizziness, tremors, weakness, numbness and headaches.  Psychiatric/Behavioral:  Negative for confusion and sleep disturbance.     Objective:  BP 124/72 (BP Location: Right Arm, Patient Position: Sitting, Cuff Size: Normal)   Pulse 70   Temp 98 F (36.7 C) (Oral)   Ht '5\' 3"'$  (1.6 m)   Wt 221 lb (100.2 kg)   SpO2 98%   BMI 39.15 kg/m   BP Readings from Last 3 Encounters:  02/14/22 124/72  08/15/21 122/82  05/12/21 122/88    Wt Readings from Last 3 Encounters:  02/14/22 221 lb (100.2 kg)  08/15/21 222 lb (100.7 kg)  05/12/21 218 lb (98.9 kg)    Physical Exam Constitutional:      General: She is not in acute distress.    Appearance: She is well-developed.  HENT:     Head: Normocephalic.     Right Ear: External ear normal.     Left Ear: External ear normal.     Nose: Nose normal.  Eyes:     General:        Right eye: No discharge.        Left eye: No discharge.  Conjunctiva/sclera: Conjunctivae normal.     Pupils: Pupils are equal, round, and reactive to light.  Neck:     Thyroid: No thyromegaly.     Vascular: No JVD.     Trachea: No tracheal deviation.  Cardiovascular:     Rate and Rhythm: Normal rate and regular rhythm.     Heart sounds: Normal heart sounds.  Pulmonary:     Effort: No respiratory distress.     Breath sounds: No stridor. No wheezing.  Abdominal:     General: Bowel sounds are normal. There is no distension.     Palpations: Abdomen is soft. There is no mass.     Tenderness: There is no abdominal tenderness. There is no guarding or rebound.  Musculoskeletal:        General: No tenderness.     Cervical back: Normal range of motion and neck supple. No rigidity.  Lymphadenopathy:     Cervical: No  cervical adenopathy.  Skin:    Findings: No erythema or rash.  Neurological:     Cranial Nerves: No cranial nerve deficit.     Motor: No abnormal muscle tone.     Coordination: Coordination normal.     Deep Tendon Reflexes: Reflexes normal.  Psychiatric:        Behavior: Behavior normal.        Thought Content: Thought content normal.        Judgment: Judgment normal.     Lab Results  Component Value Date   WBC 9.4 04/11/2021   HGB 13.6 04/11/2021   HCT 41.3 04/11/2021   PLT 309.0 04/11/2021   GLUCOSE 121 (H) 04/11/2021   CHOL 239 (H) 04/11/2021   TRIG 313.0 (H) 04/11/2021   HDL 41.30 04/11/2021   LDLDIRECT 164.0 04/11/2021   LDLCALC 144 (H) 02/09/2017   ALT 27 04/11/2021   AST 17 04/11/2021   NA 137 04/11/2021   K 3.9 04/11/2021   CL 101 04/11/2021   CREATININE 0.84 04/11/2021   BUN 12 04/11/2021   CO2 27 04/11/2021   TSH 1.96 04/11/2021   HGBA1C 6.2 04/11/2021    US PELVIC COMPLETE WITH TRANSVAGINAL  Result Date: 05/21/2020 CLINICAL DATA:  Pelvic pain EXAM: TRANSABDOMINAL AND TRANSVAGINAL ULTRASOUND OF PELVIS TECHNIQUE: Both transabdominal and transvaginal ultrasound examinations of the pelvis were performed. Transabdominal technique was performed for global imaging of the pelvis including uterus, ovaries, adnexal regions, and pelvic cul-de-sac. It was necessary to proceed with endovaginal exam following the transabdominal exam to visualize the uterus, endometrium, ovaries and adnexa. COMPARISON:  None FINDINGS: Uterus Measurements: 11.5 x 5.2 x 6.2 cm = volume: 197 mL. At least 3 fibroids noted, the largest a left posterior fibroid measuring up to 2.7 cm. Endometrium Thickness: 7 mm in thickness.  No focal abnormality visualized. Right ovary Measurements: 1.9 x 1.5 x 2.1 cm = volume: 3.1 mL. Normal appearance/no adnexal mass. Left ovary Measurements: 3.2 x 1.6 x 2.9 cm = volume: 8.0 mL. Normal appearance/no adnexal mass. Other findings No abnormal free fluid. IMPRESSION:  Fibroid uterus. No acute findings. Electronically Signed   By: Rolm Baptise M.D.   On: 05/21/2020 10:57    Assessment & Plan:   Problem List Items Addressed This Visit     Depression    Continue with Trintellix      Diabetes type 2, controlled ([place])    Early stages.  Hemoglobin A1c was 6.5%. Start metformin daily Weight loss, exercise, carb reduction and diet       Relevant Medications  metFORMIN (GLUCOPHAGE) 500 MG tablet   Dyslipidemia   Relevant Medications   metFORMIN (GLUCOPHAGE) 500 MG tablet   Other Relevant Orders   Hemoglobin A1c   TSH   Comprehensive metabolic panel   Lipid panel   Hemochromatosis (Chronic)    Monitor CBC, iron      Hyperglycemia - Primary    Apparently worse: elevated glucose 128, A1c 6.5% per patient per Dr. Philis Pique (verbal report).  Likely prediabetes. Start metformin 500 mg daily Try Dexcom 7 sample to look at the glucose dynamics Walk at least 5000 steps a day, exercise, lose weight, cut back on carbs Weight loss aids, i.e. phentermine, Wegovy, Zepbound options were discussed  RTC 3 mo      Relevant Medications   metFORMIN (GLUCOPHAGE) 500 MG tablet   Other Relevant Orders   Hemoglobin A1c   TSH   Comprehensive metabolic panel   Lipid panel   Hypertension    Continue with telmisartan      Relevant Medications   metFORMIN (GLUCOPHAGE) 500 MG tablet   Other Relevant Orders   Hemoglobin A1c   TSH   Comprehensive metabolic panel   Situational anxiety    Molly Quinn is going through separation.  Coping okay       Follow-up: Return in about 3 months (around 05/16/2022) for a follow-up visit.  [name], MD

## 2022-02-15 ENCOUNTER — Encounter: Payer: Self-pay | Admitting: Internal Medicine

## 2022-02-15 NOTE — Assessment & Plan Note (Signed)
Molly Quinn is going through separation.  Coping okay

## 2022-02-15 NOTE — Assessment & Plan Note (Signed)
Monitor CBC, iron

## 2022-02-15 NOTE — Assessment & Plan Note (Addendum)
Apparently worse: elevated glucose 128, A1c 6.5% per patient per Dr. Philis Pique (verbal report).  Likely prediabetes. Start metformin 500 mg daily Try Dexcom 7 sample to look at the glucose dynamics Walk at least 5000 steps a day, exercise, lose weight, cut back on carbs Weight loss aids, i.e. phentermine, Wegovy, Zepbound options were discussed  RTC 3 mo

## 2022-02-15 NOTE — Assessment & Plan Note (Signed)
Continue with Trintellix

## 2022-02-15 NOTE — Assessment & Plan Note (Signed)
Continue with telmisartan

## 2022-02-17 DIAGNOSIS — E119 Type 2 diabetes mellitus without complications: Secondary | ICD-10-CM | POA: Insufficient documentation

## 2022-02-17 NOTE — Assessment & Plan Note (Signed)
Early stages.  Hemoglobin A1c was 6.5%. Start metformin daily Weight loss, exercise, carb reduction and diet

## 2022-03-01 DIAGNOSIS — Z6838 Body mass index (BMI) 38.0-38.9, adult: Secondary | ICD-10-CM | POA: Diagnosis not present

## 2022-03-01 DIAGNOSIS — N946 Dysmenorrhea, unspecified: Secondary | ICD-10-CM | POA: Diagnosis not present

## 2022-03-27 ENCOUNTER — Other Ambulatory Visit: Payer: Self-pay | Admitting: Internal Medicine

## 2022-03-27 ENCOUNTER — Encounter: Payer: Self-pay | Admitting: Internal Medicine

## 2022-04-03 ENCOUNTER — Other Ambulatory Visit: Payer: Self-pay | Admitting: Internal Medicine

## 2022-04-03 DIAGNOSIS — R739 Hyperglycemia, unspecified: Secondary | ICD-10-CM

## 2022-05-26 DIAGNOSIS — H52223 Regular astigmatism, bilateral: Secondary | ICD-10-CM | POA: Diagnosis not present

## 2022-05-26 DIAGNOSIS — R7303 Prediabetes: Secondary | ICD-10-CM | POA: Diagnosis not present

## 2022-05-26 DIAGNOSIS — H5213 Myopia, bilateral: Secondary | ICD-10-CM | POA: Diagnosis not present

## 2022-05-26 DIAGNOSIS — H524 Presbyopia: Secondary | ICD-10-CM | POA: Diagnosis not present

## 2022-06-26 ENCOUNTER — Other Ambulatory Visit: Payer: Self-pay | Admitting: Internal Medicine

## 2022-07-24 ENCOUNTER — Other Ambulatory Visit: Payer: Self-pay | Admitting: Internal Medicine

## 2022-07-31 ENCOUNTER — Telehealth: Payer: Self-pay | Admitting: Internal Medicine

## 2022-07-31 NOTE — Telephone Encounter (Signed)
Refill was denied pt is overdue for appt. Last CPX 04/11/21 must see MD for refills.../l,mb

## 2022-07-31 NOTE — Telephone Encounter (Signed)
Prescription Request  07/31/2022  LOV: 02/14/2022  What is the name of the medication or equipment? trintellix  Have you contacted your pharmacy to request a refill? Yes   Which pharmacy would you like this sent to?  [id number] - [place], [place] - [address] AT [facility] [address] [place] [address] Phone: [phone] Fax: [phone]   Patient notified that their request is being sent to the clinical staff for review and that they should receive a response within 2 business days.   Please advise at Mobile [phone] (mobile)

## 2022-08-01 ENCOUNTER — Encounter: Payer: Self-pay | Admitting: Internal Medicine

## 2022-08-01 ENCOUNTER — Ambulatory Visit (INDEPENDENT_AMBULATORY_CARE_PROVIDER_SITE_OTHER): Payer: BC Managed Care – PPO | Admitting: Internal Medicine

## 2022-08-01 VITALS — BP 128/84 | HR 75 | Temp 98.2°F | Ht 63.0 in | Wt 210.0 lb

## 2022-08-01 DIAGNOSIS — K219 Gastro-esophageal reflux disease without esophagitis: Secondary | ICD-10-CM

## 2022-08-01 DIAGNOSIS — E785 Hyperlipidemia, unspecified: Secondary | ICD-10-CM | POA: Diagnosis not present

## 2022-08-01 DIAGNOSIS — R739 Hyperglycemia, unspecified: Secondary | ICD-10-CM

## 2022-08-01 DIAGNOSIS — I10 Essential (primary) hypertension: Secondary | ICD-10-CM

## 2022-08-01 DIAGNOSIS — E119 Type 2 diabetes mellitus without complications: Secondary | ICD-10-CM

## 2022-08-01 DIAGNOSIS — Z7984 Long term (current) use of oral hypoglycemic drugs: Secondary | ICD-10-CM

## 2022-08-01 DIAGNOSIS — Z0000 Encounter for general adult medical examination without abnormal findings: Secondary | ICD-10-CM | POA: Diagnosis not present

## 2022-08-01 DIAGNOSIS — L819 Disorder of pigmentation, unspecified: Secondary | ICD-10-CM | POA: Insufficient documentation

## 2022-08-01 MED ORDER — METFORMIN HCL 500 MG PO TABS: 500.0000 mg | ORAL_TABLET | Freq: Every day | ORAL | 3 refills | Status: DC

## 2022-08-01 MED ORDER — ASPIRIN 81 MG PO TBEC: 81.0000 mg | DELAYED_RELEASE_TABLET | Freq: Every day | ORAL | 3 refills | Status: DC

## 2022-08-01 MED ORDER — TELMISARTAN 40 MG PO TABS: ORAL_TABLET | ORAL | 3 refills | Status: DC

## 2022-08-01 MED ORDER — VORTIOXETINE HBR 10 MG PO TABS: 10.0000 mg | ORAL_TABLET | Freq: Every day | ORAL | 3 refills | Status: DC

## 2022-08-01 MED ORDER — ALPRAZOLAM 0.5 MG PO TABS: 0.5000 mg | ORAL_TABLET | Freq: Two times a day (BID) | ORAL | 1 refills | Status: AC | PRN

## 2022-08-01 MED ORDER — DEXILANT 60 MG PO CPDR: 1.0000 | DELAYED_RELEASE_CAPSULE | Freq: Every day | ORAL | 0 refills | Status: DC

## 2022-08-01 NOTE — Progress Notes (Signed)
Subjective:  Patient ID: Molly Quinn, female    DOB: August 12, 1975  Age: 47 y.o. MRN: [id number]  CC: Annual Exam (PHYSICAL)   HPI GRACLYNN QUINNELL presents for a well exam C/o L index - - dark spots; gets pale and cold a lot    Outpatient Medications Prior to Visit  Medication Sig Dispense Refill  . Calcium Carbonate-Vitamin D (CALCIUM 600+D HIGH POTENCY PO) Take 1 tablet by mouth daily. 600mg /35mcg    . cholecalciferol (VITAMIN D3) 25 MCG (1000 UNIT) tablet Take 1,000 Units by mouth daily.    Marland Kitchen LAMICTAL XR 100 MG TB24 24 hour tablet Take 300 mg by mouth at bedtime.  0  . Omega-3 Fatty Acids (FISH OIL PO) Take 1,400 mg by mouth daily.    . Tdap (BOOSTRIX) 5-2.5-18.5 LF-MCG/0.5 injection inject 0.5 milliliter intramuscularly    . ALPRAZolam (XANAX) 0.5 MG tablet Take 1-2 tablets (0.5-1 mg total) by mouth 2 (two) times daily as needed for anxiety (for flight anxiety). 30 tablet 1  . DEXILANT 60 MG capsule Take 1 capsule (60 mg total) by mouth daily. 90 capsule 0  . metFORMIN (GLUCOPHAGE) 500 MG tablet Take 1 tablet (500 mg total) by mouth daily with breakfast. 90 tablet 3  . telmisartan (MICARDIS) 40 MG tablet TAKE 1 TABLET(40 MG) BY MOUTH DAILY 90 tablet 3  . vortioxetine HBr (TRINTELLIX) 10 MG TABS tablet Take 1 tablet (10 mg total) by mouth daily. Overdue for Annual appt must see provider for future refills 30 tablet 0   No facility-administered medications prior to visit.    ROS: Review of Systems  Constitutional:  Negative for activity change, appetite change, chills, fatigue and unexpected weight change.  HENT:  Negative for congestion, mouth sores and sinus pressure.   Eyes:  Negative for visual disturbance.  Respiratory:  Negative for cough and chest tightness.   Gastrointestinal:  Negative for abdominal pain and nausea.  Genitourinary:  Negative for difficulty urinating, frequency and vaginal pain.  Musculoskeletal:  Negative for back pain and gait problem.  Skin:   Negative for pallor and rash.  Neurological:  Negative for dizziness, tremors, weakness, numbness and headaches.  Psychiatric/Behavioral:  Negative for confusion and sleep disturbance.     Objective:  BP 128/84 (BP Location: Left Arm, Patient Position: Sitting, Cuff Size: Large)   Pulse 75   Temp 98.2 F (36.8 C) (Oral)   Ht 5\' 3"  (1.6 m)   Wt 210 lb (95.3 kg)   SpO2 95%   BMI 37.20 kg/m   BP Readings from Last 3 Encounters:  08/01/22 128/84  02/14/22 124/72  08/15/21 122/82    Wt Readings from Last 3 Encounters:  08/01/22 210 lb (95.3 kg)  02/14/22 221 lb (100.2 kg)  08/15/21 222 lb (100.7 kg)    Physical Exam Constitutional:      General: She is not in acute distress.    Appearance: She is well-developed.  HENT:     Head: Normocephalic.     Right Ear: External ear normal.     Left Ear: External ear normal.     Nose: Nose normal.  Eyes:     General:        Right eye: No discharge.        Left eye: No discharge.     Conjunctiva/sclera: Conjunctivae normal.     Pupils: Pupils are equal, round, and reactive to light.  Neck:     Thyroid: No thyromegaly.     Vascular: No  JVD.     Trachea: No tracheal deviation.  Cardiovascular:     Rate and Rhythm: Normal rate and regular rhythm.     Heart sounds: Normal heart sounds.  Pulmonary:     Effort: No respiratory distress.     Breath sounds: No stridor. No wheezing.  Abdominal:     General: Bowel sounds are normal. There is no distension.     Palpations: Abdomen is soft. There is no mass.     Tenderness: There is no abdominal tenderness. There is no guarding or rebound.  Musculoskeletal:        General: No tenderness.     Cervical back: Normal range of motion and neck supple. No rigidity.  Lymphadenopathy:     Cervical: No cervical adenopathy.  Skin:    Findings: No erythema or rash.  Neurological:     Cranial Nerves: No cranial nerve deficit.     Motor: No abnormal muscle tone.     Coordination: Coordination  normal.     Deep Tendon Reflexes: Reflexes normal.  Psychiatric:        Behavior: Behavior normal.        Thought Content: Thought content normal.        Judgment: Judgment normal.    Lab Results  Component Value Date   WBC 9.4 04/11/2021   HGB 13.6 04/11/2021   HCT 41.3 04/11/2021   PLT 309.0 04/11/2021   GLUCOSE 121 (H) 04/11/2021   CHOL 239 (H) 04/11/2021   TRIG 313.0 (H) 04/11/2021   HDL 41.30 04/11/2021   LDLDIRECT 164.0 04/11/2021   LDLCALC 144 (H) 02/09/2017   ALT 27 04/11/2021   AST 17 04/11/2021   NA 137 04/11/2021   K 3.9 04/11/2021   CL 101 04/11/2021   CREATININE 0.84 04/11/2021   BUN 12 04/11/2021   CO2 27 04/11/2021   TSH 1.96 04/11/2021   HGBA1C 6.2 04/11/2021    US PELVIC COMPLETE WITH TRANSVAGINAL  Result Date: 05/21/2020 CLINICAL DATA:  Pelvic pain EXAM: TRANSABDOMINAL AND TRANSVAGINAL ULTRASOUND OF PELVIS TECHNIQUE: Both transabdominal and transvaginal ultrasound examinations of the pelvis were performed. Transabdominal technique was performed for global imaging of the pelvis including uterus, ovaries, adnexal regions, and pelvic cul-de-sac. It was necessary to proceed with endovaginal exam following the transabdominal exam to visualize the uterus, endometrium, ovaries and adnexa. COMPARISON:  None FINDINGS: Uterus Measurements: 11.5 x 5.2 x 6.2 cm = volume: 197 mL. At least 3 fibroids noted, the largest a left posterior fibroid measuring up to 2.7 cm. Endometrium Thickness: 7 mm in thickness.  No focal abnormality visualized. Right ovary Measurements: 1.9 x 1.5 x 2.1 cm = volume: 3.1 mL. Normal appearance/no adnexal mass. Left ovary Measurements: 3.2 x 1.6 x 2.9 cm = volume: 8.0 mL. Normal appearance/no adnexal mass. Other findings No abnormal free fluid. IMPRESSION: Fibroid uterus. No acute findings. Electronically Signed   By: Charlett Nose M.D.   On: 05/21/2020 10:57    Assessment & Plan:   Problem List Items Addressed This Visit     Dyslipidemia    Relevant Medications   metFORMIN (GLUCOPHAGE) 500 MG tablet   Hyperglycemia   Relevant Medications   DEXILANT 60 MG capsule   metFORMIN (GLUCOPHAGE) 500 MG tablet   telmisartan (MICARDIS) 40 MG tablet   Other Relevant Orders   Hemoglobin A1c   CBC with Differential/Platelet   Comprehensive metabolic panel   Lipid panel   Urine microalbumin-creatinine with uACR   Hypertension   Relevant Medications  metFORMIN (GLUCOPHAGE) 500 MG tablet   telmisartan (MICARDIS) 40 MG tablet   Other Visit Diagnoses     Gastroesophageal reflux disease, unspecified whether esophagitis present       Relevant Medications   DEXILANT 60 MG capsule         Meds ordered this encounter  Medications  . ALPRAZolam (XANAX) 0.5 MG tablet    Sig: Take 1-2 tablets (0.5-1 mg total) by mouth 2 (two) times daily as needed for anxiety (for flight anxiety).    Dispense:  30 tablet    Refill:  1  . DEXILANT 60 MG capsule    Sig: Take 1 capsule (60 mg total) by mouth daily.    Dispense:  90 capsule    Refill:  0  . metFORMIN (GLUCOPHAGE) 500 MG tablet    Sig: Take 1 tablet (500 mg total) by mouth daily with breakfast.    Dispense:  90 tablet    Refill:  3  . telmisartan (MICARDIS) 40 MG tablet    Sig: TAKE 1 TABLET(40 MG) BY MOUTH DAILY    Dispense:  90 tablet    Refill:  3  . vortioxetine HBr (TRINTELLIX) 10 MG TABS tablet    Sig: Take 1 tablet (10 mg total) by mouth daily. Overdue for Annual appt must see provider for future refills    Dispense:  90 tablet    Refill:  3      Follow-up: No follow-ups on file.  Sonda Primes, MD

## 2022-08-01 NOTE — Assessment & Plan Note (Signed)
R/o vasculitis - one finger

## 2022-08-01 NOTE — Assessment & Plan Note (Signed)
Check Ferritin 

## 2022-08-02 NOTE — Assessment & Plan Note (Signed)
We discussed age appropriate health related issues, including available/recomended screening tests and vaccinations. We discussed a need for adhering to healthy diet and exercise. Labs were ordered to be later reviewed . All questions were answered. GYN exam once a year/mammogram Last colonoscopy 2022, Dr. Lavon Paganini

## 2022-08-02 NOTE — Assessment & Plan Note (Addendum)
Continue on metformin Obtain lab work Discussed other options, i.e. Ozempic

## 2022-09-08 ENCOUNTER — Other Ambulatory Visit (INDEPENDENT_AMBULATORY_CARE_PROVIDER_SITE_OTHER): Payer: BC Managed Care – PPO

## 2022-09-08 DIAGNOSIS — L819 Disorder of pigmentation, unspecified: Secondary | ICD-10-CM | POA: Diagnosis not present

## 2022-09-08 DIAGNOSIS — R739 Hyperglycemia, unspecified: Secondary | ICD-10-CM | POA: Diagnosis not present

## 2022-09-08 LAB — COMPREHENSIVE METABOLIC PANEL
ALT: 15 U/L (ref 0–35)
AST: 16 U/L (ref 0–37)
Albumin: 4.5 g/dL (ref 3.5–5.2)
Alkaline Phosphatase: 74 U/L (ref 39–117)
BUN: 13 mg/dL (ref 6–23)
CO2: 23 mEq/L (ref 19–32)
Calcium: 9.3 mg/dL (ref 8.4–10.5)
Chloride: 105 mEq/L (ref 96–112)
Creatinine, Ser: 0.82 mg/dL (ref 0.40–1.20)
GFR: 84.71 mL/min (ref >60.00)
Glucose, Bld: 108 mg/dL — ABNORMAL HIGH (ref 70–99)
Potassium: 4.1 mEq/L (ref 3.5–5.1)
Sodium: 137 mEq/L (ref 135–145)
Total Bilirubin: 0.6 mg/dL (ref 0.2–1.2)
Total Protein: 6.7 g/dL (ref 6.0–8.3)

## 2022-09-08 LAB — CBC WITH DIFFERENTIAL/PLATELET
Basophils Absolute: 0 10*3/uL (ref 0.0–0.1)
Basophils Relative: 0.3 % (ref 0.0–3.0)
Eosinophils Absolute: 0 10*3/uL (ref 0.0–0.7)
Eosinophils Relative: 0 % (ref 0.0–5.0)
HCT: 38.8 % (ref 36.0–46.0)
Hemoglobin: 12.9 g/dL (ref 12.0–15.0)
Lymphocytes Relative: 30.1 % (ref 12.0–46.0)
Lymphs Abs: 2.1 10*3/uL (ref 0.7–4.0)
MCHC: 33.3 g/dL (ref 30.0–36.0)
MCV: 84.4 fl (ref 78.0–100.0)
Monocytes Absolute: 0.4 10*3/uL (ref 0.1–1.0)
Monocytes Relative: 5.1 % (ref 3.0–12.0)
Neutro Abs: 4.5 10*3/uL (ref 1.4–7.7)
Neutrophils Relative %: 64.5 % (ref 43.0–77.0)
Platelets: 353 10*3/uL (ref 150.0–400.0)
RBC: 4.6 Mil/uL (ref 3.87–5.11)
RDW: 13.9 % (ref 11.5–15.5)
WBC: 6.9 10*3/uL (ref 4.0–10.5)

## 2022-09-08 LAB — HEMOGLOBIN A1C: Hgb A1c MFr Bld: 6.1 % (ref 4.6–6.5)

## 2022-09-08 LAB — LIPID PANEL
Cholesterol: 235 mg/dL — ABNORMAL HIGH (ref 0–200)
HDL: 42.7 mg/dL (ref >39.00)
LDL Cholesterol: 159 mg/dL — ABNORMAL HIGH (ref 0–99)
NonHDL: 192.51
Total CHOL/HDL Ratio: 6
Triglycerides: 170 mg/dL — ABNORMAL HIGH (ref 0.0–149.0)
VLDL: 34 mg/dL (ref 0.0–40.0)

## 2022-09-08 LAB — SEDIMENTATION RATE: Sed Rate: 20 mm/hr (ref 0–20)

## 2022-09-09 LAB — ANA: Anti Nuclear Antibody (ANA): NEGATIVE

## 2022-09-09 LAB — IRON,TIBC AND FERRITIN PANEL
%SAT: 24 % (calc) (ref 16–45)
Ferritin: 7 ng/mL — ABNORMAL LOW (ref 16–232)
Iron: 72 ug/dL (ref 40–190)
TIBC: 295 mcg/dL (calc) (ref 250–450)

## 2022-12-15 ENCOUNTER — Other Ambulatory Visit: Payer: Self-pay | Admitting: Internal Medicine

## 2022-12-15 DIAGNOSIS — K219 Gastro-esophageal reflux disease without esophagitis: Secondary | ICD-10-CM

## 2022-12-26 ENCOUNTER — Other Ambulatory Visit: Payer: Self-pay | Admitting: Internal Medicine

## 2022-12-26 DIAGNOSIS — K219 Gastro-esophageal reflux disease without esophagitis: Secondary | ICD-10-CM

## 2023-01-22 DIAGNOSIS — G40B09 Juvenile myoclonic epilepsy, not intractable, without status epilepticus: Secondary | ICD-10-CM | POA: Diagnosis not present

## 2023-03-13 ENCOUNTER — Other Ambulatory Visit: Payer: Self-pay | Admitting: Internal Medicine

## 2023-03-13 DIAGNOSIS — K219 Gastro-esophageal reflux disease without esophagitis: Secondary | ICD-10-CM

## 2023-04-02 DIAGNOSIS — Z1231 Encounter for screening mammogram for malignant neoplasm of breast: Secondary | ICD-10-CM | POA: Diagnosis not present

## 2023-04-02 DIAGNOSIS — Z01419 Encounter for gynecological examination (general) (routine) without abnormal findings: Secondary | ICD-10-CM | POA: Diagnosis not present

## 2023-04-02 DIAGNOSIS — Z124 Encounter for screening for malignant neoplasm of cervix: Secondary | ICD-10-CM | POA: Diagnosis not present

## 2023-04-27 ENCOUNTER — Other Ambulatory Visit: Payer: Self-pay | Admitting: Nurse Practitioner

## 2023-04-27 ENCOUNTER — Other Ambulatory Visit: Payer: Self-pay

## 2023-04-27 DIAGNOSIS — D649 Anemia, unspecified: Secondary | ICD-10-CM

## 2023-04-30 ENCOUNTER — Inpatient Hospital Stay (HOSPITAL_BASED_OUTPATIENT_CLINIC_OR_DEPARTMENT_OTHER): Payer: BC Managed Care – PPO | Admitting: Nurse Practitioner

## 2023-04-30 ENCOUNTER — Inpatient Hospital Stay: Payer: BC Managed Care – PPO | Attending: Nurse Practitioner

## 2023-04-30 ENCOUNTER — Encounter: Payer: Self-pay | Admitting: Nurse Practitioner

## 2023-04-30 DIAGNOSIS — G40909 Epilepsy, unspecified, not intractable, without status epilepticus: Secondary | ICD-10-CM | POA: Insufficient documentation

## 2023-04-30 DIAGNOSIS — D649 Anemia, unspecified: Secondary | ICD-10-CM

## 2023-04-30 DIAGNOSIS — D509 Iron deficiency anemia, unspecified: Secondary | ICD-10-CM

## 2023-04-30 LAB — CBC WITH DIFFERENTIAL (CANCER CENTER ONLY)
Abs Immature Granulocytes: 0.02 10*3/uL (ref 0.00–0.07)
Basophils Absolute: 0 10*3/uL (ref 0.0–0.1)
Basophils Relative: 0 %
Eosinophils Absolute: 0 10*3/uL (ref 0.0–0.5)
Eosinophils Relative: 0 %
HCT: 39.8 % (ref 36.0–46.0)
Hemoglobin: 13.6 g/dL (ref 12.0–15.0)
Immature Granulocytes: 0 %
Lymphocytes Relative: 30 %
Lymphs Abs: 2.3 10*3/uL (ref 0.7–4.0)
MCH: 28.6 pg (ref 26.0–34.0)
MCHC: 34.2 g/dL (ref 30.0–36.0)
MCV: 83.8 fL (ref 80.0–100.0)
Monocytes Absolute: 0.4 10*3/uL (ref 0.1–1.0)
Monocytes Relative: 5 %
Neutro Abs: 4.9 10*3/uL (ref 1.7–7.7)
Neutrophils Relative %: 65 %
Platelet Count: 338 10*3/uL (ref 150–400)
RBC: 4.75 MIL/uL (ref 3.87–5.11)
RDW: 13.1 % (ref 11.5–15.5)
WBC Count: 7.7 10*3/uL (ref 4.0–10.5)
nRBC: 0 % (ref 0.0–0.2)

## 2023-04-30 LAB — CMP (CANCER CENTER ONLY)
ALT: 18 U/L (ref 0–44)
AST: 15 U/L (ref 15–41)
Albumin: 4.1 g/dL (ref 3.5–5.0)
Alkaline Phosphatase: 75 U/L (ref 38–126)
Anion gap: 7 (ref 5–15)
BUN: 11 mg/dL (ref 6–20)
CO2: 25 mmol/L (ref 22–32)
Calcium: 9.3 mg/dL (ref 8.9–10.3)
Chloride: 102 mmol/L (ref 98–111)
Creatinine: 0.77 mg/dL (ref 0.44–1.00)
GFR, Estimated: >60 mL/min (ref >60)
Glucose, Bld: 159 mg/dL — ABNORMAL HIGH (ref 70–99)
Potassium: 4.4 mmol/L (ref 3.5–5.1)
Sodium: 134 mmol/L — ABNORMAL LOW (ref 135–145)
Total Bilirubin: 0.5 mg/dL (ref 0.0–1.2)
Total Protein: 7 g/dL (ref 6.5–8.1)

## 2023-04-30 LAB — FERRITIN: Ferritin: 8 ng/mL — ABNORMAL LOW (ref 11–307)

## 2023-04-30 MED ORDER — FERROUS SULFATE 325 (65 FE) MG PO TBEC: 325.0000 mg | DELAYED_RELEASE_TABLET | Freq: Every day | ORAL | 3 refills | Status: DC

## 2023-04-30 NOTE — Progress Notes (Signed)
  [facility] OFFICE PROGRESS NOTE   Diagnosis: Hemochromatosis  INTERVAL HISTORY:   Ms. Douglas returns for follow-up.  She was last seen at the hematology office 12/07/2020.  Main complaint is fatigue.  She craves ice.  Only bleeding is menstrual cycle.  Typically last 3 to 4 days, heavy 1 or 2 of those days.  No change in bowel habits.  Objective:  Vital signs in last 24 hours:  Blood pressure 118/84, pulse 82, temperature 98.1 F (36.7 C), temperature source Temporal, resp. rate 18, height 5\' 3"  (1.6 m), weight 218 lb 11.2 oz (99.2 kg), SpO2 98%.    HEENT: No thrush or ulcers. Lymphatics: No palpable cervical, supraclavicular, axillary or inguinal lymph nodes. Resp: Lungs clear bilaterally. Cardio: Regular rate and rhythm. GI: Abdomen soft and nontender.  No hepatosplenomegaly. Vascular: No leg edema.   Lab Results:  Lab Results  Component Value Date   WBC 7.7 04/30/2023   HGB 13.6 04/30/2023   HCT 39.8 04/30/2023   MCV 83.8 04/30/2023   PLT 338 04/30/2023   NEUTROABS 4.9 04/30/2023    Imaging:  No results found.  Medications: I have reviewed the patient's current medications.  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 History of an abnormal liver ultrasound-negative abdomen CT 04/09/2014 Status post cesarean section delivery of a female baby in August 2012. Iron deficiency, ferritin 8 04/30/2023  Disposition: Ms. Flicker appears stable.  She has hereditary hemochromatosis, last underwent phlebotomy February 2018.  She has iron deficiency in the absence of anemia.  We discussed the iron deficiency is most likely related to ongoing menses.  She will complete stool cards.  She is symptomatic with fatigue and ice craving.  We discussed a trial of oral iron.  She understands the situation is complicated due to the hereditary hemochromatosis.  She would  like to proceed with oral iron.  We will need to follow labs closely.  She will begin ferrous sulfate 325 mg daily.  We reviewed the potential for constipation and nausea.  She will return for repeat labs and follow-up in approximately 8 weeks.  We are available to see her sooner if needed.    Lonna Cobb ANP/GNP-BC   04/30/2023  10:05 AM

## 2023-06-22 ENCOUNTER — Inpatient Hospital Stay: Payer: BC Managed Care – PPO

## 2023-06-22 ENCOUNTER — Other Ambulatory Visit

## 2023-06-22 ENCOUNTER — Inpatient Hospital Stay: Attending: Nurse Practitioner

## 2023-06-22 DIAGNOSIS — G40909 Epilepsy, unspecified, not intractable, without status epilepticus: Secondary | ICD-10-CM | POA: Diagnosis not present

## 2023-06-22 DIAGNOSIS — D509 Iron deficiency anemia, unspecified: Secondary | ICD-10-CM

## 2023-06-22 LAB — CBC WITH DIFFERENTIAL (CANCER CENTER ONLY)
Abs Immature Granulocytes: 0.02 10*3/uL (ref 0.00–0.07)
Basophils Absolute: 0 10*3/uL (ref 0.0–0.1)
Basophils Relative: 0 %
Eosinophils Absolute: 0 10*3/uL (ref 0.0–0.5)
Eosinophils Relative: 0 %
HCT: 40.7 % (ref 36.0–46.0)
Hemoglobin: 14.3 g/dL (ref 12.0–15.0)
Immature Granulocytes: 0 %
Lymphocytes Relative: 27 %
Lymphs Abs: 2.1 10*3/uL (ref 0.7–4.0)
MCH: 29.9 pg (ref 26.0–34.0)
MCHC: 35.1 g/dL (ref 30.0–36.0)
MCV: 85.1 fL (ref 80.0–100.0)
Monocytes Absolute: 0.4 10*3/uL (ref 0.1–1.0)
Monocytes Relative: 6 %
Neutro Abs: 5.2 10*3/uL (ref 1.7–7.7)
Neutrophils Relative %: 67 %
Platelet Count: 292 10*3/uL (ref 150–400)
RBC: 4.78 MIL/uL (ref 3.87–5.11)
RDW: 13.5 % (ref 11.5–15.5)
WBC Count: 7.8 10*3/uL (ref 4.0–10.5)
nRBC: 0 % (ref 0.0–0.2)

## 2023-06-22 LAB — FERRITIN: Ferritin: 25 ng/mL (ref 11–307)

## 2023-06-29 ENCOUNTER — Other Ambulatory Visit: Payer: BC Managed Care – PPO

## 2023-06-29 ENCOUNTER — Inpatient Hospital Stay (HOSPITAL_BASED_OUTPATIENT_CLINIC_OR_DEPARTMENT_OTHER): Payer: BC Managed Care – PPO | Admitting: Oncology

## 2023-06-29 DIAGNOSIS — G40909 Epilepsy, unspecified, not intractable, without status epilepticus: Secondary | ICD-10-CM | POA: Diagnosis not present

## 2023-06-29 NOTE — Progress Notes (Signed)
  [facility] OFFICE PROGRESS NOTE   Diagnosis: Hemochromatosis  INTERVAL HISTORY:   Ms. Kroeker returns for scheduled visit.  She began daily ferrous sulfate  after an office visit 04/30/2023.  She reports improvement in ice craving and her energy level since beginning iron. She continues to have a menstrual cycle.  No other bleeding.  The ferritin returned at 25 on 06/22/2023.  She reduced the iron to 3 days/week.   Objective:  Vital signs in last 24 hours:  Blood pressure 115/83, pulse 77, temperature 98.1 F (36.7 C), resp. rate 18, height 5\' 3"  (1.6 m), weight 222 lb 12.8 oz (101.1 kg), SpO2 98%.   Resp: Lungs clear bilaterally Cardio: Regular rate and rhythm GI: No hepatosplenomegaly Vascular: No leg edema     Lab Results:  Lab Results  Component Value Date   WBC 7.8 06/22/2023   HGB 14.3 06/22/2023   HCT 40.7 06/22/2023   MCV 85.1 06/22/2023   PLT 292 06/22/2023   NEUTROABS 5.2 06/22/2023    CMP  Lab Results  Component Value Date   NA 134 (L) 04/30/2023   K 4.4 04/30/2023   CL 102 04/30/2023   CO2 25 04/30/2023   GLUCOSE 159 (H) 04/30/2023   BUN 11 04/30/2023   CREATININE 0.77 04/30/2023   CALCIUM 9.3 04/30/2023   PROT 7.0 04/30/2023   ALBUMIN 4.1 04/30/2023   AST 15 04/30/2023   ALT 18 04/30/2023   ALKPHOS 75 04/30/2023   BILITOT 0.5 04/30/2023   GFRNONAA >60 04/30/2023   GFRAA >60 04/25/2017    Medications: I have reviewed the patient's current medications.   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 History of an abnormal liver ultrasound-negative abdomen CT 04/09/2014 Status post cesarean section delivery of a female baby in August 2012. Iron deficiency, ferritin 8 04/30/2023 Ferrous sulfate  325 mg daily 04/30/2023 Ferrous sulfate  325 mg 3 days/week 06/22/2023    Disposition: Ms. Balthazar appears stable.  She has hereditary  hemochromatosis and iron deficiency.  The iron deficiency is likely secondary to menstrual blood loss.  She began a trial of oral iron therapy in February.  Ice craving and her energy level have improved.  The ferritin was higher 06/22/2023.  She will continue her sulfate 3 days/week.  She will return stool Hemoccult cards.  She will return for a lab visit in 6 weeks and an office visit in 3 months.  Coni Deep, MD  06/29/2023  12:54 PM

## 2023-07-03 ENCOUNTER — Other Ambulatory Visit: Payer: Self-pay | Admitting: Internal Medicine

## 2023-07-03 DIAGNOSIS — R739 Hyperglycemia, unspecified: Secondary | ICD-10-CM

## 2023-07-03 NOTE — Telephone Encounter (Signed)
 Copied from CRM [id number]. Topic: Clinical - Medication Refill >> Jul 03, 2023  3:47 PM Kita Perish H wrote: Most Recent Primary Care Visit:  Provider: Genia Kettering  Department: [facility]  Visit Type: PHYSICAL  Date: 08/01/2022  Medication: telmisartan  (MICARDIS ) 40 MG tablet   Has the patient contacted their pharmacy? Yes, contact provider (Agent: If no, request that the patient contact the pharmacy for the refill. If patient does not wish to contact the pharmacy document the reason why and proceed with request.) (Agent: If yes, when and what did the pharmacy advise?)  Is this the correct pharmacy for this prescription? Yes If no, delete pharmacy and type the correct one.  This is the patient's preferred pharmacy:  [id number] - [place], [place] - [address] AT [facility] OF [address] [place] [address] Phone: [phone] Fax: [phone]    Has the prescription been filled recently? No  Is the patient out of the medication? Yes  Has the patient been seen for an appointment in the last year OR does the patient have an upcoming appointment? Yes  Can we respond through MyChart? Yes  Agent: Please be advised that Rx refills may take up to 3 business days. We ask that you follow-up with your pharmacy.

## 2023-07-05 NOTE — Telephone Encounter (Signed)
 LVM for patient, overdue for a follow up

## 2023-07-23 ENCOUNTER — Encounter (HOSPITAL_COMMUNITY): Payer: Self-pay

## 2023-07-25 ENCOUNTER — Encounter: Payer: Self-pay | Admitting: Oncology

## 2023-07-27 ENCOUNTER — Other Ambulatory Visit: Payer: Self-pay | Admitting: Internal Medicine

## 2023-07-27 DIAGNOSIS — R739 Hyperglycemia, unspecified: Secondary | ICD-10-CM

## 2023-07-27 DIAGNOSIS — E785 Hyperlipidemia, unspecified: Secondary | ICD-10-CM

## 2023-07-27 DIAGNOSIS — I10 Essential (primary) hypertension: Secondary | ICD-10-CM

## 2023-07-30 ENCOUNTER — Ambulatory Visit (AMBULATORY_SURGERY_CENTER)

## 2023-07-30 VITALS — Ht 63.0 in | Wt 220.0 lb

## 2023-07-30 DIAGNOSIS — Z860100 Personal history of colon polyps, unspecified: Secondary | ICD-10-CM

## 2023-07-30 MED ORDER — NA SULFATE-K SULFATE-MG SULF 17.5-3.13-1.6 GM/177ML PO SOLN: 1.0000 | Freq: Once | ORAL | 0 refills | Status: AC

## 2023-07-30 NOTE — Progress Notes (Signed)

## 2023-07-31 ENCOUNTER — Encounter: Payer: Self-pay | Admitting: Gastroenterology

## 2023-08-10 ENCOUNTER — Inpatient Hospital Stay

## 2023-08-10 ENCOUNTER — Telehealth: Payer: Self-pay | Admitting: Nurse Practitioner

## 2023-08-10 NOTE — Telephone Encounter (Signed)
 Called PT to reschedule lab appt, left vm

## 2023-08-13 ENCOUNTER — Other Ambulatory Visit: Payer: Self-pay | Admitting: Internal Medicine

## 2023-08-13 NOTE — Telephone Encounter (Unsigned)
 Copied from CRM [id number]. Topic: Clinical - Medication Refill >> Aug 13, 2023  2:24 PM Tanazia G wrote: Medication: vortioxetine  HBr (TRINTELLIX ) 10 MG TABS tablet  Has the patient contacted their pharmacy? Yes (Agent: If no, request that the patient contact the pharmacy for the refill. If patient does not wish to contact the pharmacy document the reason why and proceed with request.) (Agent: If yes, when and what did the pharmacy advise?)  This is the patient's preferred pharmacy:  [id number] - [place], [place] - [address] AT [facility] OF [facility] [address]  [place] [address] Phone: [phone] Fax: [phone]   Is this the correct pharmacy for this prescription? Yes If no, delete pharmacy and type the correct one.   Has the prescription been filled recently? Yes  Is the patient out of the medication? Yes  Has the patient been seen for an appointment in the last year OR does the patient have an upcoming appointment? Yes  Can we respond through MyChart? Yes  Agent: Please be advised that Rx refills may take up to 3 business days. We ask that you follow-up with your pharmacy.

## 2023-08-14 ENCOUNTER — Telehealth: Payer: Self-pay

## 2023-08-14 ENCOUNTER — Other Ambulatory Visit: Payer: Self-pay | Admitting: Internal Medicine

## 2023-08-14 NOTE — Telephone Encounter (Signed)
 Copied from CRM [id number]. Topic: Clinical - Medication Refill >> Aug 14, 2023  4:52 PM Chuck Crater wrote: Patient stated that this is night 3 that she will be out of medication. She wants to know if it is safe to stop this suddenly since she is out.

## 2023-08-20 ENCOUNTER — Other Ambulatory Visit: Payer: Self-pay | Admitting: Internal Medicine

## 2023-08-20 DIAGNOSIS — I10 Essential (primary) hypertension: Secondary | ICD-10-CM

## 2023-08-20 DIAGNOSIS — E785 Hyperlipidemia, unspecified: Secondary | ICD-10-CM

## 2023-08-20 DIAGNOSIS — R739 Hyperglycemia, unspecified: Secondary | ICD-10-CM

## 2023-08-29 ENCOUNTER — Ambulatory Visit (INDEPENDENT_AMBULATORY_CARE_PROVIDER_SITE_OTHER): Admitting: Internal Medicine

## 2023-08-29 ENCOUNTER — Encounter: Payer: Self-pay | Admitting: Internal Medicine

## 2023-08-29 VITALS — BP 130/70 | HR 71 | Temp 99.1°F | Ht 63.0 in | Wt 219.0 lb

## 2023-08-29 DIAGNOSIS — F339 Major depressive disorder, recurrent, unspecified: Secondary | ICD-10-CM | POA: Diagnosis not present

## 2023-08-29 DIAGNOSIS — I10 Essential (primary) hypertension: Secondary | ICD-10-CM

## 2023-08-29 DIAGNOSIS — K219 Gastro-esophageal reflux disease without esophagitis: Secondary | ICD-10-CM | POA: Diagnosis not present

## 2023-08-29 DIAGNOSIS — Z0000 Encounter for general adult medical examination without abnormal findings: Secondary | ICD-10-CM

## 2023-08-29 DIAGNOSIS — R739 Hyperglycemia, unspecified: Secondary | ICD-10-CM

## 2023-08-29 DIAGNOSIS — E785 Hyperlipidemia, unspecified: Secondary | ICD-10-CM | POA: Diagnosis not present

## 2023-08-29 DIAGNOSIS — F988 Other specified behavioral and emotional disorders with onset usually occurring in childhood and adolescence: Secondary | ICD-10-CM | POA: Insufficient documentation

## 2023-08-29 DIAGNOSIS — F900 Attention-deficit hyperactivity disorder, predominantly inattentive type: Secondary | ICD-10-CM

## 2023-08-29 MED ORDER — METFORMIN HCL 500 MG PO TABS: 500.0000 mg | ORAL_TABLET | Freq: Every day | ORAL | 3 refills | Status: DC

## 2023-08-29 MED ORDER — AMPHETAMINE-DEXTROAMPHETAMINE 5 MG PO TABS: 5.0000 mg | ORAL_TABLET | Freq: Two times a day (BID) | ORAL | 0 refills | Status: DC

## 2023-08-29 MED ORDER — DEXLANSOPRAZOLE 60 MG PO CPDR: 60.0000 mg | DELAYED_RELEASE_CAPSULE | Freq: Every day | ORAL | 3 refills | Status: AC

## 2023-08-29 MED ORDER — TELMISARTAN 40 MG PO TABS: ORAL_TABLET | ORAL | 3 refills | Status: AC

## 2023-08-29 NOTE — Progress Notes (Signed)
 Subjective:  Patient ID: Molly Quinn, female    DOB: [birth year]  Age: 48 y.o. MRN: [id number]  CC: Medical Management of Chronic Issues (Discuss increasing depression medication vortioxetine  HBr (TRINTELLIX ) 10 MG TABS tablet.)   HPI RAEVYN SOKOL presents for probable ADD - worse C/o depressed Mom and brother have ADD - worse  Outpatient Medications Prior to Visit  Medication Sig Dispense Refill   ALPRAZolam  (XANAX ) 0.5 MG tablet Take 1-2 tablets (0.5-1 mg total) by mouth 2 (two) times daily as needed for anxiety (for flight anxiety). 30 tablet 1   Calcium Carbonate-Vitamin D  (CALCIUM 600+D HIGH POTENCY PO) Take 1 tablet by mouth daily. 600mg /65mcg     cholecalciferol (VITAMIN D3) 25 MCG (1000 UNIT) tablet Take 1,000 Units by mouth daily.     ferrous sulfate  325 (65 FE) MG EC tablet Take 1 tablet (325 mg total) by mouth daily with breakfast. 30 tablet 3   LAMICTAL  XR 100 MG TB24 24 hour tablet Take 300 mg by mouth at bedtime.  0   Na Sulfate-K Sulfate-Mg Sulfate concentrate (SUPREP) 17.5-3.13-1.6 GM/177ML SOLN as directed.     Omega-3 Fatty Acids (FISH OIL) 1200 MG CAPS Take by mouth.     vortioxetine  HBr (TRINTELLIX ) 10 MG TABS tablet TAKE 1 TABLET BY MOUTH EVERY DAY 90 tablet 1   dexlansoprazole  (DEXILANT ) 60 MG capsule TAKE 1 CAPSULE(60 MG) BY MOUTH DAILY 90 capsule 1   metFORMIN  (GLUCOPHAGE ) 500 MG tablet Take 1 tablet (500 mg total) by mouth daily with breakfast. 90 tablet 3   telmisartan  (MICARDIS ) 40 MG tablet TAKE 1 TABLET(40 MG) BY MOUTH DAILY 90 tablet 3   No facility-administered medications prior to visit.    ROS: Review of Systems  Constitutional:  Negative for activity change, appetite change, chills, fatigue and unexpected weight change.  HENT:  Negative for congestion, mouth sores and sinus pressure.   Eyes:  Negative for visual disturbance.  Respiratory:  Negative for cough and chest tightness.   Gastrointestinal:  Negative for abdominal pain and nausea.   Genitourinary:  Negative for difficulty urinating, frequency and vaginal pain.  Musculoskeletal:  Negative for back pain and gait problem.  Skin:  Negative for pallor and rash.  Neurological:  Negative for dizziness, tremors, weakness, numbness and headaches.  Psychiatric/Behavioral:  Positive for decreased concentration. Negative for confusion, sleep disturbance and suicidal ideas. The patient is not nervous/anxious.     Objective:  BP 130/70   Pulse 71   Temp 99.1 F (37.3 C) (Oral)   Ht 5' 3 (1.6 m)   Wt 219 lb (99.3 kg)   SpO2 96%   BMI 38.79 kg/m   BP Readings from Last 3 Encounters:  08/29/23 130/70  06/29/23 115/83  04/30/23 118/84    Wt Readings from Last 3 Encounters:  08/29/23 219 lb (99.3 kg)  07/30/23 220 lb (99.8 kg)  06/29/23 222 lb 12.8 oz (101.1 kg)    Physical Exam Constitutional:      General: She is not in acute distress.    Appearance: She is well-developed.  HENT:     Head: Normocephalic.     Right Ear: External ear normal.     Left Ear: External ear normal.     Nose: Nose normal.   Eyes:     General:        Right eye: No discharge.        Left eye: No discharge.     Conjunctiva/sclera: Conjunctivae normal.  Pupils: Pupils are equal, round, and reactive to light.   Neck:     Thyroid : No thyromegaly.     Vascular: No JVD.     Trachea: No tracheal deviation.   Cardiovascular:     Rate and Rhythm: Normal rate and regular rhythm.     Heart sounds: Normal heart sounds.  Pulmonary:     Effort: No respiratory distress.     Breath sounds: No stridor. No wheezing.  Abdominal:     General: Bowel sounds are normal. There is no distension.     Palpations: Abdomen is soft. There is no mass.     Tenderness: There is no abdominal tenderness. There is no guarding or rebound.   Musculoskeletal:        General: No tenderness.     Cervical back: Normal range of motion and neck supple. No rigidity.  Lymphadenopathy:     Cervical: No cervical  adenopathy.   Skin:    Findings: No erythema or rash.   Neurological:     Mental Status: She is oriented to person, place, and time.     Cranial Nerves: No cranial nerve deficit.     Motor: No abnormal muscle tone.     Coordination: Coordination normal.     Deep Tendon Reflexes: Reflexes normal.   Psychiatric:        Behavior: Behavior normal.        Thought Content: Thought content normal.        Judgment: Judgment normal.     Lab Results  Component Value Date   WBC 7.8 06/22/2023   HGB 14.3 06/22/2023   HCT 40.7 06/22/2023   PLT 292 06/22/2023   GLUCOSE 159 (H) 04/30/2023   CHOL 235 (H) 09/08/2022   TRIG 170.0 (H) 09/08/2022   HDL 42.70 09/08/2022   LDLDIRECT 164.0 04/11/2021   LDLCALC 159 (H) 09/08/2022   ALT 18 04/30/2023   AST 15 04/30/2023   NA 134 (L) 04/30/2023   K 4.4 04/30/2023   CL 102 04/30/2023   CREATININE 0.77 04/30/2023   BUN 11 04/30/2023   CO2 25 04/30/2023   TSH 1.96 04/11/2021   HGBA1C 6.1 09/08/2022    US  PELVIC COMPLETE WITH TRANSVAGINAL Result Date: 05/21/2020 CLINICAL DATA:  Pelvic pain EXAM: TRANSABDOMINAL AND TRANSVAGINAL ULTRASOUND OF PELVIS TECHNIQUE: Both transabdominal and transvaginal ultrasound examinations of the pelvis were performed. Transabdominal technique was performed for global imaging of the pelvis including uterus, ovaries, adnexal regions, and pelvic cul-de-sac. It was necessary to proceed with endovaginal exam following the transabdominal exam to visualize the uterus, endometrium, ovaries and adnexa. COMPARISON:  None FINDINGS: Uterus Measurements: 11.5 x 5.2 x 6.2 cm = volume: 197 mL. At least 3 fibroids noted, the largest a left posterior fibroid measuring up to 2.7 cm. Endometrium Thickness: 7 mm in thickness.  No focal abnormality visualized. Right ovary Measurements: 1.9 x 1.5 x 2.1 cm = volume: 3.1 mL. Normal appearance/no adnexal mass. Left ovary Measurements: 3.2 x 1.6 x 2.9 cm = volume: 8.0 mL. Normal appearance/no  adnexal mass. Other findings No abnormal free fluid. IMPRESSION: Fibroid uterus. No acute findings. Electronically Signed   By: Janeece Mechanic M.D.   On: 05/21/2020 10:57    Assessment & Plan:   Problem List Items Addressed This Visit     Dyslipidemia   Relevant Orders   TSH   Urinalysis   CBC with Differential/Platelet   Lipid panel   Comprehensive metabolic panel with GFR   Hemoglobin A1c  Hyperglycemia   Relevant Orders   TSH   Urinalysis   CBC with Differential/Platelet   Lipid panel   Comprehensive metabolic panel with GFR   Hemoglobin A1c   Well adult exam   Relevant Orders   TSH   Urinalysis   CBC with Differential/Platelet   Lipid panel   Comprehensive metabolic panel with GFR   Hemoglobin A1c   Depression - Primary   Continue with Trintellix       Hypertension   Relevant Medications   telmisartan  (MICARDIS ) 40 MG tablet   ADD (attention deficit disorder)   Will start adderall   Potential benefits of a long term amphetamines  use as well as potential risks  and complications were explained to the patient and were aknowledged.       Other Visit Diagnoses       Gastroesophageal reflux disease, unspecified whether esophagitis present       Relevant Medications   Na Sulfate-K Sulfate-Mg Sulfate concentrate (SUPREP) 17.5-3.13-1.6 GM/177ML SOLN   dexlansoprazole  (DEXILANT ) 60 MG capsule         Meds ordered this encounter  Medications   dexlansoprazole  (DEXILANT ) 60 MG capsule    Sig: Take 1 capsule (60 mg total) by mouth daily.    Dispense:  90 capsule    Refill:  3   metFORMIN  (GLUCOPHAGE ) 500 MG tablet    Sig: Take 1 tablet (500 mg total) by mouth daily with breakfast.    Dispense:  90 tablet    Refill:  3   telmisartan  (MICARDIS ) 40 MG tablet    Sig: TAKE 1 TABLET(40 MG) BY MOUTH DAILY    Dispense:  90 tablet    Refill:  3   amphetamine-dextroamphetamine (ADDERALL) 5 MG tablet    Sig: Take 1 tablet (5 mg total) by mouth 2 (two) times daily  with a meal. Breakfast and lunch    Dispense:  60 tablet    Refill:  0      Follow-up: Return in about 3 months (around 11/29/2023) for a follow-up visit.  Anitra Barn, MD

## 2023-08-29 NOTE — Assessment & Plan Note (Signed)
 Continue with Trintellix

## 2023-08-29 NOTE — Assessment & Plan Note (Signed)
 Will start adderall   Potential benefits of a long term amphetamines  use as well as potential risks  and complications were explained to the patient and were aknowledged.

## 2023-09-13 ENCOUNTER — Encounter: Admitting: Gastroenterology

## 2023-09-20 ENCOUNTER — Ambulatory Visit: Payer: Self-pay | Admitting: Oncology

## 2023-09-20 ENCOUNTER — Inpatient Hospital Stay: Admitting: Oncology

## 2023-09-20 ENCOUNTER — Encounter: Payer: Self-pay | Admitting: Internal Medicine

## 2023-09-20 ENCOUNTER — Inpatient Hospital Stay: Attending: Nurse Practitioner

## 2023-09-20 ENCOUNTER — Other Ambulatory Visit: Payer: Self-pay | Admitting: *Deleted

## 2023-09-20 DIAGNOSIS — G40909 Epilepsy, unspecified, not intractable, without status epilepticus: Secondary | ICD-10-CM | POA: Insufficient documentation

## 2023-09-20 DIAGNOSIS — E119 Type 2 diabetes mellitus without complications: Secondary | ICD-10-CM

## 2023-09-20 LAB — CBC WITH DIFFERENTIAL (CANCER CENTER ONLY)
Abs Immature Granulocytes: 0.02 K/uL (ref 0.00–0.07)
Basophils Absolute: 0 K/uL (ref 0.0–0.1)
Basophils Relative: 0 %
Eosinophils Absolute: 0 K/uL (ref 0.0–0.5)
Eosinophils Relative: 0 %
HCT: 43.6 % (ref 36.0–46.0)
Hemoglobin: 15 g/dL (ref 12.0–15.0)
Immature Granulocytes: 0 %
Lymphocytes Relative: 30 %
Lymphs Abs: 2.3 K/uL (ref 0.7–4.0)
MCH: 31 pg (ref 26.0–34.0)
MCHC: 34.4 g/dL (ref 30.0–36.0)
MCV: 90.1 fL (ref 80.0–100.0)
Monocytes Absolute: 0.5 K/uL (ref 0.1–1.0)
Monocytes Relative: 7 %
Neutro Abs: 4.6 K/uL (ref 1.7–7.7)
Neutrophils Relative %: 63 %
Platelet Count: 316 K/uL (ref 150–400)
RBC: 4.84 MIL/uL (ref 3.87–5.11)
RDW: 12.6 % (ref 11.5–15.5)
WBC Count: 7.5 K/uL (ref 4.0–10.5)
nRBC: 0 % (ref 0.0–0.2)

## 2023-09-20 LAB — CMP (CANCER CENTER ONLY)
ALT: 37 U/L (ref 0–44)
AST: 26 U/L (ref 15–41)
Albumin: 4.6 g/dL (ref 3.5–5.0)
Alkaline Phosphatase: 81 U/L (ref 38–126)
Anion gap: 14 (ref 5–15)
BUN: 10 mg/dL (ref 6–20)
CO2: 20 mmol/L — ABNORMAL LOW (ref 22–32)
Calcium: 9.6 mg/dL (ref 8.9–10.3)
Chloride: 101 mmol/L (ref 98–111)
Creatinine: 0.82 mg/dL (ref 0.44–1.00)
GFR, Estimated: >60 mL/min
Glucose, Bld: 128 mg/dL — ABNORMAL HIGH (ref 70–99)
Potassium: 4.5 mmol/L (ref 3.5–5.1)
Sodium: 135 mmol/L (ref 135–145)
Total Bilirubin: 0.7 mg/dL (ref 0.0–1.2)
Total Protein: 6.6 g/dL (ref 6.5–8.1)

## 2023-09-20 LAB — HEMOGLOBIN A1C
Hgb A1c MFr Bld: 5.7 % — ABNORMAL HIGH (ref 4.8–5.6)
Mean Plasma Glucose: 117 mg/dL

## 2023-09-20 LAB — FERRITIN: Ferritin: 36 ng/mL (ref 11–307)

## 2023-09-20 NOTE — Progress Notes (Signed)
  [facility] OFFICE PROGRESS NOTE   Diagnosis: Hemochromatosis, iron deficiency  INTERVAL HISTORY:   Ms. Duhamel returns as scheduled.  She is not taking iron consistently.  She craves red meat.  She continues to have a monthly menstrual cycle.  No other bleeding.  Objective:  Vital signs in last 24 hours:  Blood pressure 117/79, pulse 82, temperature 98 F (36.7 C), temperature source Temporal, resp. rate 18, height 5' 3 (1.6 m), weight 215 lb (97.5 kg), SpO2 98%.  Resp: Lungs clear bilaterally Cardio: Rate and rhythm GI: No hepatosplenomegaly Vascular: No leg edema   Lab Results:  Lab Results  Component Value Date   WBC 7.5 09/20/2023   HGB 15.0 09/20/2023   HCT 43.6 09/20/2023   MCV 90.1 09/20/2023   PLT 316 09/20/2023   NEUTROABS 4.6 09/20/2023    CMP  Lab Results  Component Value Date   NA 134 (L) 04/30/2023   K 4.4 04/30/2023   CL 102 04/30/2023   CO2 25 04/30/2023   GLUCOSE 159 (H) 04/30/2023   BUN 11 04/30/2023   CREATININE 0.77 04/30/2023   CALCIUM 9.3 04/30/2023   PROT 7.0 04/30/2023   ALBUMIN 4.1 04/30/2023   AST 15 04/30/2023   ALT 18 04/30/2023   ALKPHOS 75 04/30/2023   BILITOT 0.5 04/30/2023   GFRNONAA >60 04/30/2023   GFRAA >60 04/25/2017     Medications: I have reviewed the patient's current medications.   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 History of an abnormal liver ultrasound-negative abdomen CT 04/09/2014 Status post cesarean section delivery of a female baby in August 2012. Iron deficiency, ferritin 8 04/30/2023 Ferrous sulfate  325 mg daily 04/30/2023 Ferrous sulfate  325 mg 3 days/week 06/22/2023, not taking at office visit 09/20/2023     Disposition: Ms. Ormiston appears well.  The hemoglobin is normal.  Will follow-up on the ferritin level from today and decide on the indication to continue iron therapy.   She requested we check her glucose and hemoglobin A1c today.  She will return for an office and lab visit in 3 months. She is rescheduling a colonoscopy that was canceled due to recent travel.  Arley Hof, MD  09/20/2023  10:00 AM

## 2023-09-21 ENCOUNTER — Other Ambulatory Visit

## 2023-09-21 ENCOUNTER — Ambulatory Visit: Admitting: Oncology

## 2023-09-21 ENCOUNTER — Ambulatory Visit: Payer: Self-pay | Admitting: Internal Medicine

## 2023-09-21 ENCOUNTER — Encounter: Payer: Self-pay | Admitting: Oncology

## 2023-09-21 NOTE — Telephone Encounter (Signed)
 Called Molly Quinn w/lab results and that she may d/c the ferrous sulfate  now. Scheduler will call her with 3 month f/u. Routed all results to PCP>

## 2023-10-13 ENCOUNTER — Encounter: Payer: Self-pay | Admitting: Internal Medicine

## 2023-10-20 ENCOUNTER — Other Ambulatory Visit: Payer: Self-pay | Admitting: Internal Medicine

## 2023-10-20 MED ORDER — AMPHETAMINE-DEXTROAMPHETAMINE 10 MG PO TABS: ORAL_TABLET | ORAL | 0 refills | Status: DC

## 2023-10-25 ENCOUNTER — Encounter: Payer: Self-pay | Admitting: Gastroenterology

## 2023-10-31 ENCOUNTER — Encounter: Payer: Self-pay | Admitting: Internal Medicine

## 2023-11-14 ENCOUNTER — Other Ambulatory Visit: Payer: Self-pay | Admitting: Internal Medicine

## 2023-11-15 ENCOUNTER — Other Ambulatory Visit: Payer: Self-pay | Admitting: Internal Medicine

## 2023-11-16 ENCOUNTER — Other Ambulatory Visit (INDEPENDENT_AMBULATORY_CARE_PROVIDER_SITE_OTHER)

## 2023-11-16 DIAGNOSIS — E785 Hyperlipidemia, unspecified: Secondary | ICD-10-CM

## 2023-11-16 DIAGNOSIS — R739 Hyperglycemia, unspecified: Secondary | ICD-10-CM

## 2023-11-16 DIAGNOSIS — Z0000 Encounter for general adult medical examination without abnormal findings: Secondary | ICD-10-CM | POA: Diagnosis not present

## 2023-11-16 LAB — URINALYSIS, ROUTINE W REFLEX MICROSCOPIC
Hgb urine dipstick: NEGATIVE
Ketones, ur: 40 — AB
Leukocytes,Ua: NEGATIVE
Nitrite: NEGATIVE
Specific Gravity, Urine: >=1.03 — AB (ref 1.000–1.030)
Total Protein, Urine: 30 — AB
Urine Glucose: NEGATIVE
Urobilinogen, UA: 0.2 (ref 0.0–1.0)
pH: 5.5 (ref 5.0–8.0)

## 2023-11-16 LAB — CBC WITH DIFFERENTIAL/PLATELET
Basophils Absolute: 0 K/uL (ref 0.0–0.1)
Basophils Relative: 0.2 % (ref 0.0–3.0)
Eosinophils Absolute: 0 K/uL (ref 0.0–0.7)
Eosinophils Relative: 0 % (ref 0.0–5.0)
HCT: 45.9 % (ref 36.0–46.0)
Hemoglobin: 15.6 g/dL — ABNORMAL HIGH (ref 12.0–15.0)
Lymphocytes Relative: 25.6 % (ref 12.0–46.0)
Lymphs Abs: 1.8 K/uL (ref 0.7–4.0)
MCHC: 34.1 g/dL (ref 30.0–36.0)
MCV: 89.6 fl (ref 78.0–100.0)
Monocytes Absolute: 0.4 K/uL (ref 0.1–1.0)
Monocytes Relative: 5.9 % (ref 3.0–12.0)
Neutro Abs: 4.9 K/uL (ref 1.4–7.7)
Neutrophils Relative %: 68.3 % (ref 43.0–77.0)
Platelets: 329 K/uL (ref 150.0–400.0)
RBC: 5.12 Mil/uL — ABNORMAL HIGH (ref 3.87–5.11)
RDW: 12.9 % (ref 11.5–15.5)
WBC: 7.1 K/uL (ref 4.0–10.5)

## 2023-11-16 LAB — LIPID PANEL
Cholesterol: 218 mg/dL — ABNORMAL HIGH (ref 0–200)
HDL: 42.2 mg/dL (ref >39.00)
LDL Cholesterol: 149 mg/dL — ABNORMAL HIGH (ref 0–99)
NonHDL: 175.47
Total CHOL/HDL Ratio: 5
Triglycerides: 133 mg/dL (ref 0.0–149.0)
VLDL: 26.6 mg/dL (ref 0.0–40.0)

## 2023-11-16 LAB — COMPREHENSIVE METABOLIC PANEL WITH GFR
ALT: 25 U/L (ref 0–35)
AST: 20 U/L (ref 0–37)
Albumin: 4.6 g/dL (ref 3.5–5.2)
Alkaline Phosphatase: 72 U/L (ref 39–117)
BUN: 15 mg/dL (ref 6–23)
CO2: 27 meq/L (ref 19–32)
Calcium: 9.5 mg/dL (ref 8.4–10.5)
Chloride: 98 meq/L (ref 96–112)
Creatinine, Ser: 0.97 mg/dL (ref 0.40–1.20)
GFR: 68.67 mL/min (ref >60.00)
Glucose, Bld: 134 mg/dL — ABNORMAL HIGH (ref 70–99)
Potassium: 4.3 meq/L (ref 3.5–5.1)
Sodium: 135 meq/L (ref 135–145)
Total Bilirubin: 0.9 mg/dL (ref 0.2–1.2)
Total Protein: 7.2 g/dL (ref 6.0–8.3)

## 2023-11-16 LAB — TSH: TSH: 2.77 u[IU]/mL (ref 0.35–5.50)

## 2023-11-16 LAB — HEMOGLOBIN A1C: Hgb A1c MFr Bld: 6.3 % (ref 4.6–6.5)

## 2023-11-19 ENCOUNTER — Ambulatory Visit: Payer: Self-pay | Admitting: Internal Medicine

## 2023-12-07 ENCOUNTER — Ambulatory Visit (AMBULATORY_SURGERY_CENTER)

## 2023-12-07 VITALS — Ht 63.0 in | Wt 210.0 lb

## 2023-12-07 DIAGNOSIS — Z860100 Personal history of colon polyps, unspecified: Secondary | ICD-10-CM

## 2023-12-07 NOTE — Progress Notes (Signed)

## 2023-12-10 ENCOUNTER — Encounter: Payer: Self-pay | Admitting: Gastroenterology

## 2023-12-21 ENCOUNTER — Encounter: Payer: Self-pay | Admitting: Gastroenterology

## 2023-12-21 ENCOUNTER — Ambulatory Visit (AMBULATORY_SURGERY_CENTER): Admitting: Gastroenterology

## 2023-12-21 VITALS — BP 125/85 | HR 83 | Temp 98.2°F | Resp 14 | Ht 63.0 in | Wt 210.0 lb

## 2023-12-21 DIAGNOSIS — K5730 Diverticulosis of large intestine without perforation or abscess without bleeding: Secondary | ICD-10-CM

## 2023-12-21 DIAGNOSIS — Z1211 Encounter for screening for malignant neoplasm of colon: Secondary | ICD-10-CM | POA: Diagnosis not present

## 2023-12-21 DIAGNOSIS — K644 Residual hemorrhoidal skin tags: Secondary | ICD-10-CM

## 2023-12-21 DIAGNOSIS — Z860101 Personal history of adenomatous and serrated colon polyps: Secondary | ICD-10-CM | POA: Diagnosis not present

## 2023-12-21 DIAGNOSIS — K648 Other hemorrhoids: Secondary | ICD-10-CM

## 2023-12-21 DIAGNOSIS — Z860100 Personal history of colon polyps, unspecified: Secondary | ICD-10-CM

## 2023-12-21 MED ORDER — SODIUM CHLORIDE 0.9 % IV SOLN: 500.0000 mL | Freq: Once | INTRAVENOUS | Status: DC

## 2023-12-21 NOTE — Patient Instructions (Signed)
-  Handout on diverticulosis and hemorrhoids provided -Continue present medications -Repeat colonoscopy in 5 years  YOU HAD AN ENDOSCOPIC PROCEDURE TODAY AT THE [facility]:   Refer to the procedure report that was given to you for any specific questions about what was found during the examination.  If the procedure report does not answer your questions, please call your gastroenterologist to clarify.  If you requested that your care partner not be given the details of your procedure findings, then the procedure report has been included in a sealed envelope for you to review at your convenience later.  YOU SHOULD EXPECT: Some feelings of bloating in the abdomen. Passage of more gas than usual.  Walking can help get rid of the air that was put into your GI tract during the procedure and reduce the bloating. If you had a lower endoscopy (such as a colonoscopy or flexible sigmoidoscopy) you may notice spotting of blood in your stool or on the toilet paper. If you underwent a bowel prep for your procedure, you may not have a normal bowel movement for a few days.  Please Note:  You might notice some irritation and congestion in your nose or some drainage.  This is from the oxygen used during your procedure.  There is no need for concern and it should clear up in a day or so.  SYMPTOMS TO REPORT IMMEDIATELY:  Following lower endoscopy (colonoscopy or flexible sigmoidoscopy):  Excessive amounts of blood in the stool  Significant tenderness or worsening of abdominal pains  Swelling of the abdomen that is new, acute  Fever of 100F or higher  For urgent or emergent issues, a gastroenterologist can be reached at any hour by calling [phone]. Do not use MyChart messaging for urgent concerns.    DIET:  We do recommend a small meal at first, but then you may proceed to your regular diet.  Drink plenty of fluids but you should avoid alcoholic beverages for 24 hours.  ACTIVITY:  You should  plan to take it easy for the rest of today and you should NOT DRIVE or use heavy machinery until tomorrow (because of the sedation medicines used during the test).    FOLLOW UP: Our staff will call the number listed on your records the next business day following your procedure.  We will call around 7:15- 8:00 am to check on you and address any questions or concerns that you may have regarding the information given to you following your procedure. If we do not reach you, we will leave a message.     If any biopsies were taken you will be contacted by phone or by letter within the next 1-3 weeks.  Please call us  at [phone] if you have not heard about the biopsies in 3 weeks.    SIGNATURES/CONFIDENTIALITY: You and/or your care partner have signed paperwork which will be entered into your electronic medical record.  These signatures attest to the fact that that the information above on your After Visit Summary has been reviewed and is understood.  Full responsibility of the confidentiality of this discharge information lies with you and/or your care-partner.

## 2023-12-21 NOTE — Progress Notes (Signed)
 [facility] History and Physical   Primary Care Physician:  Plotnikov, Karlynn GAILS, MD   Reason for Procedure:  History of adenomatous colon polyps  Plan:    Surveillance colonoscopy with possible interventions as needed     HPI: Molly Quinn is a very pleasant 48 y.o. female here for surveillance colonoscopy. Denies any nausea, vomiting, abdominal pain, melena or bright red blood per rectum  The risks and benefits as well as alternatives of endoscopic procedure(s) have been discussed and reviewed. All questions answered. The patient agrees to proceed.    Past Medical History:  Diagnosis Date   Allergy     Anxiety    Depression    Diabetes mellitus without complication (HCC)    Epilepsy (HCC) dx 1991   no breakthru while on meds, last in 1994- no sz in > 20 yrs   Epilepsy (HCC)    GERD (gastroesophageal reflux disease)    Hemochromatosis dx 03/2005 in [place]   phlebotomy prn - 1x/yr or less, cbc q24mo   Hyperlipidemia    Hypertension    IBS (irritable bowel syndrome)    pt states never ben told this   PONV (postoperative nausea and vomiting)     Past Surgical History:  Procedure Laterality Date   BUNIONECTOMY     CESAREAN SECTION  11/08/2010   Procedure: CESAREAN SECTION;  Surgeon: Rosaline DELENA Luna;  Location: [facility];  Service: Gynecology;  Laterality: N/A;   COLONOSCOPY     lymph node removal     REPAIR ANKLE LIGAMENT     TRANSTHORACIC ECHOCARDIOGRAM  04/2015   EF 60-65%. Normal wall thickness &  wall motion. No infiltrative disease. Normal diastolic pressures.    Prior to Admission medications   Medication Sig Start Date End Date Taking? Authorizing Provider  Calcium Carbonate-Vitamin D  (CALCIUM 600+D HIGH POTENCY PO) Take 1 tablet by mouth daily. 600mg /66mcg   Yes [provider]  cholecalciferol (VITAMIN D3) 25 MCG (1000 UNIT) tablet Take 1,000 Units by mouth daily.   Yes [provider]  dexlansoprazole  (DEXILANT ) 60 MG capsule  Take 1 capsule (60 mg total) by mouth daily. Patient taking differently: Take 60 mg by mouth as needed. 08/29/23  Yes Plotnikov, Aleksei V, MD  LamoTRIgine  300 MG TB24 24 hour tablet Take 300 mg by mouth daily. 10/23/23  Yes [provider]  metFORMIN  (GLUCOPHAGE ) 500 MG tablet TAKE 1 TABLET(500 MG) BY MOUTH DAILY WITH BREAKFAST 11/14/23  Yes Plotnikov, Aleksei V, MD  Omega-3 Fatty Acids (FISH OIL) 1200 MG CAPS Take by mouth.   Yes [provider]  telmisartan  (MICARDIS ) 40 MG tablet TAKE 1 TABLET(40 MG) BY MOUTH DAILY 08/29/23  Yes Plotnikov, Aleksei V, MD  TRINTELLIX  10 MG TABS tablet TAKE 1 TABLET BY MOUTH EVERY DAY 11/15/23  Yes Plotnikov, Aleksei V, MD  ALPRAZolam  (XANAX ) 0.5 MG tablet Take 1-2 tablets (0.5-1 mg total) by mouth 2 (two) times daily as needed for anxiety (for flight anxiety). 08/01/22   Plotnikov, Aleksei V, MD  amphetamine -dextroamphetamine  (ADDERALL) 10 MG tablet Take 1 tablet before breakfast and 1 before lunch Patient taking differently: Take 10 mg by mouth as needed. Take 1 tablet before breakfast and 1 before lunch 10/20/23   Plotnikov, Aleksei V, MD    Current Outpatient Medications  Medication Sig Dispense Refill   Calcium Carbonate-Vitamin D  (CALCIUM 600+D HIGH POTENCY PO) Take 1 tablet by mouth daily. 600mg /30mcg     cholecalciferol (VITAMIN D3) 25 MCG (1000 UNIT) tablet Take 1,000 Units by  mouth daily.     dexlansoprazole  (DEXILANT ) 60 MG capsule Take 1 capsule (60 mg total) by mouth daily. (Patient taking differently: Take 60 mg by mouth as needed.) 90 capsule 3   LamoTRIgine  300 MG TB24 24 hour tablet Take 300 mg by mouth daily.     metFORMIN  (GLUCOPHAGE ) 500 MG tablet TAKE 1 TABLET(500 MG) BY MOUTH DAILY WITH BREAKFAST 90 tablet 3   Omega-3 Fatty Acids (FISH OIL) 1200 MG CAPS Take by mouth.     telmisartan  (MICARDIS ) 40 MG tablet TAKE 1 TABLET(40 MG) BY MOUTH DAILY 90 tablet 3   TRINTELLIX  10 MG TABS tablet TAKE 1 TABLET BY MOUTH EVERY DAY 90 tablet 1    ALPRAZolam  (XANAX ) 0.5 MG tablet Take 1-2 tablets (0.5-1 mg total) by mouth 2 (two) times daily as needed for anxiety (for flight anxiety). 30 tablet 1   amphetamine -dextroamphetamine  (ADDERALL) 10 MG tablet Take 1 tablet before breakfast and 1 before lunch (Patient taking differently: Take 10 mg by mouth as needed. Take 1 tablet before breakfast and 1 before lunch) 60 tablet 0   Current Facility-Administered Medications  Medication Dose Route Frequency Provider Last Rate Last Admin   0.9 %  sodium chloride  infusion  500 mL Intravenous Once Jacquelene Kopecky V, MD        Allergies as of 12/21/2023 - Review Complete 12/21/2023  Allergen Reaction Noted   Phenytoin Rash and Other (See Comments) 10/27/2008   Phenytoin sodium extended Other (See Comments) 02/14/2022   Sulfa antibiotics Rash and Other (See Comments) 11/08/2010    Family History  Problem Relation Age of Onset   Colon polyps Mother    Stroke Mother        73   Ulcerative colitis Mother    Hypertension Mother    Hyperlipidemia Mother    Crohn's disease Mother    Colon polyps Father    Hypertension Father    Hyperlipidemia Father    Melanoma Father 63   Colon polyps Brother    Cancer Brother    Ulcerative colitis Brother        coloectomy age 58   Other Brother    Hypertension Brother    Hemochromatosis Brother    Other Brother        PSC   Other Brother        bile duct cancer   Heart disease Maternal Uncle    Prostate cancer Maternal Uncle    Colon polyps Maternal Uncle    Ovarian cancer Paternal Aunt    Cancer Paternal Aunt        ovar ca   Diabetes Maternal Grandmother    Heart disease Maternal Grandmother    Breast cancer Paternal Grandmother    Colon cancer Neg Hx    Esophageal cancer Neg Hx    Rectal cancer Neg Hx    Stomach cancer Neg Hx     Social History   Socioeconomic History   Marital status: Legally Separated    Spouse name: Not on file   Number of children: 1   Years of education: Not  on file   Highest education level: Not on file  Occupational History   Occupation: disability attorney    Employer: [employer]  Tobacco Use   Smoking status: Never   Smokeless tobacco: Never  Vaping Use   Vaping status: Never Used  Substance and Sexual Activity   Alcohol use: Yes    Comment: rare   Drug use: No   Sexual activity:  Not on file  Other Topics Concern   Not on file  Social History Narrative   Not on file   Social Drivers of Health   Financial Resource Strain: Not on file  Food Insecurity: Not on file  Transportation Needs: Not on file  Physical Activity: Not on file  Stress: Not on file  Social Connections: Not on file  Intimate Partner Violence: Not on file    Review of Systems:  All other review of systems negative except as mentioned in the HPI.  Physical Exam: Vital signs in last 24 hours: BP (!) 129/96   Pulse (!) 102   Temp 98.2 F (36.8 C)   Ht 5' 3 (1.6 m)   Wt 210 lb (95.3 kg)   LMP 11/19/2023   SpO2 97%   BMI 37.20 kg/m  General:   Alert, NAD Lungs:  Clear .   Heart:  Regular rate and rhythm Abdomen:  Soft, nontender and nondistended. Neuro/Psych:  Alert and cooperative. Normal mood and affect. A and O x 3  Reviewed labs, radiology imaging, old records and pertinent past GI work up  Patient is appropriate for planned procedure(s) and anesthesia in an ambulatory setting   K. Veena Sparrow [place] , MD [phone]

## 2023-12-21 NOTE — Op Note (Signed)
 [facility] Patient Name: Molly Quinn Procedure Date: 12/21/2023 7:35 AM MRN: [id number] Endoscopist: Gustav ALONSO Mcgee , MD, [id number] Age: 48 Referring MD:  Date of Birth: [birth year] Gender: Female Account #: [id number] Procedure:                Colonoscopy Indications:              High risk colon cancer surveillance: Personal                            history of colonic polyps, High risk colon cancer                            surveillance: Personal history of adenoma (10 mm or                            greater in size) Medicines:                Monitored Anesthesia Care Procedure:                Pre-Anesthesia Assessment:                           - Prior to the procedure, a History and Physical                            was performed, and patient medications and                            allergies were reviewed. The patient's tolerance of                            previous anesthesia was also reviewed. The risks                            and benefits of the procedure and the sedation                            options and risks were discussed with the patient.                            All questions were answered, and informed consent                            was obtained. Prior Anticoagulants: The patient has                            taken no anticoagulant or antiplatelet agents. ASA                            Grade Assessment: III - A patient with severe                            systemic disease. After reviewing the risks and  benefits, the patient was deemed in satisfactory                            condition to undergo the procedure.                           After obtaining informed consent, the colonoscope                            was passed under direct vision. Throughout the                            procedure, the patient's blood pressure, pulse, and                            oxygen saturations were monitored  continuously. The                            Olympus Scope [id number] was introduced through the                            anus and advanced to the the cecum, identified by                            appendiceal orifice and ileocecal valve. The                            colonoscopy was performed without difficulty. The                            patient tolerated the procedure well. The quality                            of the bowel preparation was good. The ileocecal                            valve, appendiceal orifice, and rectum were                            photographed. Scope In: 8:05:16 AM Scope Out: 8:17:30 AM Scope Withdrawal Time: 0 hours 7 minutes 10 seconds  Total Procedure Duration: 0 hours 12 minutes 14 seconds  Findings:                 The perianal and digital rectal examinations were                            normal.                           Scattered small-mouthed diverticula were found in                            the sigmoid colon.  Non-bleeding external and internal hemorrhoids were                            found during retroflexion. The hemorrhoids were                            small. Complications:            No immediate complications. Estimated Blood Loss:     Estimated blood loss was minimal. Impression:               - Mild diverticulosis in the sigmoid colon.                           - Non-bleeding external and internal hemorrhoids.                           - No specimens collected. Recommendation:           - Patient has a contact number available for                            emergencies. The signs and symptoms of potential                            delayed complications were discussed with the                            patient. Return to normal activities tomorrow.                            Written discharge instructions were provided to the                            patient.                           - Resume  previous diet.                           - Continue present medications.                           - Repeat colonoscopy in 5 years for surveillance. Kemari Narez V. Nhi Butrum, MD 12/21/2023 8:33:20 AM This report has been signed electronically.

## 2023-12-21 NOTE — Progress Notes (Signed)
 Report given to PACU, vss

## 2023-12-21 NOTE — Progress Notes (Signed)
 Pt's states no medical or surgical changes since previsit or office visit.

## 2023-12-24 ENCOUNTER — Telehealth: Payer: Self-pay

## 2023-12-24 NOTE — Telephone Encounter (Signed)
  Follow up Call-     12/21/2023    7:19 AM  Call back number  Post procedure Call Back phone  # [phone]  Permission to leave phone message Yes     Patient questions:  Do you have a fever, pain , or abdominal swelling? No. Pain Score  0 *  Have you tolerated food without any problems? Yes.    Have you been able to return to your normal activities? Yes.    Do you have any questions about your discharge instructions: Diet   No. Medications  No. Follow up visit  No.  Do you have questions or concerns about your Care? No.  Actions: * If pain score is 4 or above: No action needed, pain <4.

## 2023-12-25 ENCOUNTER — Inpatient Hospital Stay: Attending: Nurse Practitioner

## 2023-12-25 ENCOUNTER — Inpatient Hospital Stay: Admitting: Oncology

## 2023-12-25 ENCOUNTER — Inpatient Hospital Stay

## 2023-12-25 DIAGNOSIS — G40909 Epilepsy, unspecified, not intractable, without status epilepticus: Secondary | ICD-10-CM | POA: Insufficient documentation

## 2023-12-25 DIAGNOSIS — Z23 Encounter for immunization: Secondary | ICD-10-CM | POA: Insufficient documentation

## 2023-12-25 DIAGNOSIS — K5730 Diverticulosis of large intestine without perforation or abscess without bleeding: Secondary | ICD-10-CM | POA: Insufficient documentation

## 2023-12-25 DIAGNOSIS — D509 Iron deficiency anemia, unspecified: Secondary | ICD-10-CM

## 2023-12-25 DIAGNOSIS — L299 Pruritus, unspecified: Secondary | ICD-10-CM | POA: Diagnosis not present

## 2023-12-25 LAB — CBC WITH DIFFERENTIAL (CANCER CENTER ONLY)
Abs Immature Granulocytes: 0.03 K/uL (ref 0.00–0.07)
Basophils Absolute: 0 K/uL (ref 0.0–0.1)
Basophils Relative: 0 %
Eosinophils Absolute: 0 K/uL (ref 0.0–0.5)
Eosinophils Relative: 0 %
HCT: 41.6 % (ref 36.0–46.0)
Hemoglobin: 14.6 g/dL (ref 12.0–15.0)
Immature Granulocytes: 0 %
Lymphocytes Relative: 32 %
Lymphs Abs: 2.6 K/uL (ref 0.7–4.0)
MCH: 30.6 pg (ref 26.0–34.0)
MCHC: 35.1 g/dL (ref 30.0–36.0)
MCV: 87.2 fL (ref 80.0–100.0)
Monocytes Absolute: 0.5 K/uL (ref 0.1–1.0)
Monocytes Relative: 6 %
Neutro Abs: 4.8 K/uL (ref 1.7–7.7)
Neutrophils Relative %: 62 %
Platelet Count: 303 K/uL (ref 150–400)
RBC: 4.77 MIL/uL (ref 3.87–5.11)
RDW: 12.2 % (ref 11.5–15.5)
WBC Count: 7.9 K/uL (ref 4.0–10.5)
nRBC: 0 % (ref 0.0–0.2)

## 2023-12-25 LAB — FERRITIN: Ferritin: 32 ng/mL (ref 11–307)

## 2023-12-25 MED ORDER — INFLUENZA VIRUS VACC SPLIT PF (FLUZONE) 0.5 ML IM SUSY
0.5000 mL | PREFILLED_SYRINGE | Freq: Once | INTRAMUSCULAR | Status: AC
  Administered 2023-12-25: 0.5 mL via INTRAMUSCULAR
  Filled 2023-12-25: qty 0.5

## 2023-12-25 NOTE — Progress Notes (Signed)
  [facility] OFFICE PROGRESS NOTE   Diagnosis: Hemochromatosis, iron deficiency  INTERVAL HISTORY:   Ms. Bahner returns as scheduled.  She is not taking iron.  She has a regular menstrual cycle.  No other bleeding.  She underwent a colonoscopy 12/21/2023.  No specimens were collected.  There was mild diverticulosis in the sigmoid colon.  She reports intermittent pruritus with a light brown discoloration over the arms.  Objective:  Vital signs in last 24 hours:  Blood pressure 120/83, pulse 88, temperature 98 F (36.7 C), temperature source Temporal, resp. rate 18, height 5' 3 (1.6 m), weight 209 lb 1.6 oz (94.8 kg), last menstrual period 11/19/2023, SpO2 96%.     Resp: Lungs clear bilaterally Cardio: Regular rate and rhythm GI: No hepatosplenomegaly, no apparent ascites Vascular: No leg edema  Skin: Light brown discoloration at the forearms with areas of excoriation   Lab Results:  Lab Results  Component Value Date   WBC 7.9 12/25/2023   HGB 14.6 12/25/2023   HCT 41.6 12/25/2023   MCV 87.2 12/25/2023   PLT 303 12/25/2023   NEUTROABS 4.8 12/25/2023    CMP  Lab Results  Component Value Date   NA 135 11/16/2023   K 4.3 11/16/2023   CL 98 11/16/2023   CO2 27 11/16/2023   GLUCOSE 134 (H) 11/16/2023   BUN 15 11/16/2023   CREATININE 0.97 11/16/2023   CALCIUM 9.5 11/16/2023   PROT 7.2 11/16/2023   ALBUMIN 4.6 11/16/2023   AST 20 11/16/2023   ALT 25 11/16/2023   ALKPHOS 72 11/16/2023   BILITOT 0.9 11/16/2023   GFRNONAA >60 09/20/2023   GFRAA >60 04/25/2017    No results found for: CEA1, CEA, CAN199, CA125  No results found for: INR, LABPROT  Imaging:  No results found.  Medications: I have reviewed the patient's current medications.   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 History of an abnormal liver ultrasound-negative  abdomen CT 04/09/2014 Status post cesarean section delivery of a female baby in August 2012. Iron deficiency, ferritin 8 04/30/2023 Ferrous sulfate  325 mg daily 04/30/2023 Ferrous sulfate  325 mg 3 days/week 06/22/2023, not taking at office visit 09/20/2023 Colonoscopy 12/21/2023: Diverticulosis, nonbleeding hemorrhoids    Disposition: Ms. Exline has hereditary hemochromatosis.  The ferritin level remains in goal range.  She has not required phlebotomy therapy in many years.  She has a history of iron deficiency, likely secondary to menstrual blood loss.  She underwent a colonoscopy 12/21/2023 and had an upper endoscopy in 2022.  She remain off of iron supplementation.  I doubt the skin discoloration is related to hemochromatosis.  She will follow-up with her primary provider or dermatology if the skin changes and pruritus persist.  She will return for lab visit in 6 months and an office visit in 1 year. Ms. Detwiler received an influenza vaccine today.  Arley Hof, MD  12/25/2023  9:45 AM

## 2024-01-01 ENCOUNTER — Telehealth: Payer: Self-pay

## 2024-01-01 ENCOUNTER — Inpatient Hospital Stay

## 2024-01-01 NOTE — Telephone Encounter (Signed)
 [facility] Clinical Social Work  Clinical Social Work spoke with patient on this date regarding scheduled appointment. Patient identified appointment was likely scheduled for the advance directive referral. Patient confirmed need of completing document, but needed to cancel scheduled appointment. CSW provided information about Advance Directive Clinic held at [facility]. CSW sent mychart message to patient with contact information to get scheduled for clinic.   Lizbeth Sprague, LCSW  Clinical Social Worker [facility]

## 2024-01-01 NOTE — Telephone Encounter (Signed)
 [facility] CSW Progress Note  Patient placed on Clinical Social Worker's schedule for an appointment on this date. CSW contacted patient to discuss need for appointment. CSW asked patient to call back to discuss further.   Lizbeth Sprague, LCSW Clinical Social Worker [facility]

## 2024-01-29 ENCOUNTER — Other Ambulatory Visit: Payer: Self-pay | Admitting: Internal Medicine

## 2024-01-29 NOTE — Telephone Encounter (Signed)
 Copied from CRM #[id number]. Topic: Clinical - Medication Refill >> Jan 29, 2024 10:03 AM China J wrote: Medication: amphetamine -dextroamphetamine  (ADDERALL) 10 MG tablet  Has the patient contacted their pharmacy? Yes (Agent: If no, request that the patient contact the pharmacy for the refill. If patient does not wish to contact the pharmacy document the reason why and proceed with request.) (Agent: If yes, when and what did the pharmacy advise?) Pharmacy told patient to call clinic.  This is the patient's preferred pharmacy:  [id number] - [place], [place] - [address] AT [facility] [address] [facility] [address] [place] [address] Phone: [phone] Fax: [phone]  Is this the correct pharmacy for this prescription? Yes If no, delete pharmacy and type the correct one.   Has the prescription been filled recently? No  Is the patient out of the medication? No  Has the patient been seen for an appointment in the last year OR does the patient have an upcoming appointment? Yes  Can we respond through MyChart? No  Agent: Please be advised that Rx refills may take up to 3 business days. We ask that you follow-up with your pharmacy.

## 2024-02-01 MED ORDER — AMPHETAMINE-DEXTROAMPHETAMINE 10 MG PO TABS: ORAL_TABLET | ORAL | 0 refills | Status: DC

## 2024-02-19 ENCOUNTER — Other Ambulatory Visit: Payer: Self-pay | Admitting: Internal Medicine

## 2024-03-11 ENCOUNTER — Other Ambulatory Visit: Payer: Self-pay | Admitting: Internal Medicine

## 2024-03-11 NOTE — Telephone Encounter (Unsigned)
 Copied from CRM #[id number]. Topic: Clinical - Medication Refill >> Mar 11, 2024 10:43 AM Robinson H wrote: Medication: amphetamine -dextroamphetamine  (ADDERALL) 10 MG tablet  Has the patient contacted their pharmacy? Yes, advised to reach out to provider (Agent: If no, request that the patient contact the pharmacy for the refill. If patient does not wish to contact the pharmacy document the reason why and proceed with request.) (Agent: If yes, when and what did the pharmacy advise?)  This is the patient's preferred pharmacy:  [id number] - [place], [place] - [address] [facility] [address] [facility] [address] [place] [address] Phone: [phone] Fax: [phone]  Is this the correct pharmacy for this prescription? Yes If no, delete pharmacy and type the correct one.   Has the prescription been filled recently? No  Is the patient out of the medication? Yes  Has the patient been seen for an appointment in the last year OR does the patient have an upcoming appointment? Yes  Can we respond through MyChart? Yes  Agent: Please be advised that Rx refills may take up to 3 business days. We ask that you follow-up with your pharmacy.

## 2024-03-12 MED ORDER — AMPHETAMINE-DEXTROAMPHETAMINE 10 MG PO TABS: ORAL_TABLET | ORAL | 0 refills | Status: DC

## 2024-04-16 ENCOUNTER — Ambulatory Visit: Admitting: Internal Medicine

## 2024-04-16 ENCOUNTER — Encounter: Payer: Self-pay | Admitting: Internal Medicine

## 2024-04-16 VITALS — BP 110/80 | HR 70 | Ht 63.0 in | Wt 201.8 lb

## 2024-04-16 DIAGNOSIS — E119 Type 2 diabetes mellitus without complications: Secondary | ICD-10-CM

## 2024-04-16 DIAGNOSIS — I10 Essential (primary) hypertension: Secondary | ICD-10-CM

## 2024-04-16 DIAGNOSIS — F339 Major depressive disorder, recurrent, unspecified: Secondary | ICD-10-CM

## 2024-04-16 DIAGNOSIS — F900 Attention-deficit hyperactivity disorder, predominantly inattentive type: Secondary | ICD-10-CM

## 2024-04-16 DIAGNOSIS — E785 Hyperlipidemia, unspecified: Secondary | ICD-10-CM

## 2024-04-16 MED ORDER — AMPHETAMINE-DEXTROAMPHET ER 20 MG PO CP24: 20.0000 mg | ORAL_CAPSULE | ORAL | 0 refills | Status: DC

## 2024-04-16 MED ORDER — AMPHETAMINE-DEXTROAMPHET ER 20 MG PO CP24: 20.0000 mg | ORAL_CAPSULE | ORAL | 0 refills | Status: AC

## 2024-04-16 MED ORDER — METFORMIN HCL 500 MG PO TABS: 500.0000 mg | ORAL_TABLET | Freq: Every day | ORAL | 3 refills | Status: AC

## 2024-04-16 MED ORDER — VORTIOXETINE HBR 10 MG PO TABS: 10.0000 mg | ORAL_TABLET | Freq: Every day | ORAL | 1 refills | Status: AC

## 2024-04-16 NOTE — Progress Notes (Signed)
 "  Subjective:  Patient ID: Molly Quinn, female    DOB: 1975-12-24  Age: 49 y.o. MRN: [id number]  CC: Annual Exam (Annual Exam)   HPI [name] presents for ADD, depression, DM.  Adderall is helping.  We need to discuss hyperlipidemia  Outpatient Medications Prior to Visit  Medication Sig Dispense Refill   ALPRAZolam  (XANAX ) 0.5 MG tablet Take 1-2 tablets (0.5-1 mg total) by mouth 2 (two) times daily as needed for anxiety (for flight anxiety). 30 tablet 1   Calcium Carbonate-Vitamin D  (CALCIUM 600+D HIGH POTENCY PO) Take 1 tablet by mouth daily. 600mg /66mcg     cholecalciferol (VITAMIN D3) 25 MCG (1000 UNIT) tablet Take 1,000 Units by mouth daily.     dexlansoprazole  (DEXILANT ) 60 MG capsule Take 1 capsule (60 mg total) by mouth daily. 90 capsule 3   LamoTRIgine  300 MG TB24 24 hour tablet Take 300 mg by mouth daily.     Omega-3 Fatty Acids (FISH OIL) 1200 MG CAPS Take by mouth.     telmisartan  (MICARDIS ) 40 MG tablet TAKE 1 TABLET(40 MG) BY MOUTH DAILY 90 tablet 3   amphetamine -dextroamphetamine  (ADDERALL) 10 MG tablet Take 1 tablet before breakfast and 1 before lunch 60 tablet 0   metFORMIN  (GLUCOPHAGE ) 500 MG tablet TAKE 1 TABLET(500 MG) BY MOUTH DAILY WITH BREAKFAST 90 tablet 3   vortioxetine  HBr (TRINTELLIX ) 10 MG TABS tablet Take 1 tablet (10 mg total) by mouth daily. 90 tablet 0   No facility-administered medications prior to visit.    ROS: Review of Systems  Constitutional:  Negative for activity change, appetite change, chills, fatigue and unexpected weight change.  HENT:  Negative for congestion, mouth sores and sinus pressure.   Eyes:  Negative for visual disturbance.  Respiratory:  Negative for cough and chest tightness.   Gastrointestinal:  Negative for abdominal pain and nausea.  Genitourinary:  Negative for difficulty urinating, frequency and vaginal pain.  Musculoskeletal:  Negative for back pain and gait problem.  Skin:  Negative for pallor and rash.   Neurological:  Negative for dizziness, tremors, weakness, numbness and headaches.  Psychiatric/Behavioral:  Positive for decreased concentration. Negative for confusion, sleep disturbance and suicidal ideas.     Objective:  BP 110/80   Pulse 70   Ht 5' 3 (1.6 m)   Wt 201 lb 12.8 oz (91.5 kg)   SpO2 97%   BMI 35.75 kg/m   BP Readings from Last 3 Encounters:  04/16/24 110/80  12/25/23 120/83  12/21/23 125/85    Wt Readings from Last 3 Encounters:  04/16/24 201 lb 12.8 oz (91.5 kg)  12/25/23 209 lb 1.6 oz (94.8 kg)  12/21/23 210 lb (95.3 kg)    Physical Exam Constitutional:      General: She is not in acute distress.    Appearance: She is well-developed.  HENT:     Head: Normocephalic.     Right Ear: External ear normal.     Left Ear: External ear normal.     Nose: Nose normal.  Eyes:     General:        Right eye: No discharge.        Left eye: No discharge.     Conjunctiva/sclera: Conjunctivae normal.     Pupils: Pupils are equal, round, and reactive to light.  Neck:     Thyroid : No thyromegaly.     Vascular: No JVD.     Trachea: No tracheal deviation.  Cardiovascular:     Rate and  Rhythm: Normal rate and regular rhythm.     Heart sounds: Normal heart sounds.  Pulmonary:     Effort: No respiratory distress.     Breath sounds: No stridor. No wheezing.  Abdominal:     General: Bowel sounds are normal. There is no distension.     Palpations: Abdomen is soft. There is no mass.     Tenderness: There is no abdominal tenderness. There is no guarding or rebound.  Musculoskeletal:        General: No tenderness.     Cervical back: Normal range of motion and neck supple. No rigidity.     Right lower leg: No edema.     Left lower leg: No edema.  Lymphadenopathy:     Cervical: No cervical adenopathy.  Skin:    Findings: No erythema or rash.  Neurological:     Cranial Nerves: No cranial nerve deficit.     Motor: No abnormal muscle tone.     Coordination:  Coordination normal.     Deep Tendon Reflexes: Reflexes normal.  Psychiatric:        Behavior: Behavior normal.        Thought Content: Thought content normal.        Judgment: Judgment normal.     Lab Results  Component Value Date   WBC 7.9 12/25/2023   HGB 14.6 12/25/2023   HCT 41.6 12/25/2023   PLT 303 12/25/2023   GLUCOSE 134 (H) 11/16/2023   CHOL 218 (H) 11/16/2023   TRIG 133.0 11/16/2023   HDL 42.20 11/16/2023   LDLDIRECT 164.0 04/11/2021   LDLCALC 149 (H) 11/16/2023   ALT 25 11/16/2023   AST 20 11/16/2023   NA 135 11/16/2023   K 4.3 11/16/2023   CL 98 11/16/2023   CREATININE 0.97 11/16/2023   BUN 15 11/16/2023   CO2 27 11/16/2023   TSH 2.77 11/16/2023   HGBA1C 6.3 11/16/2023    US  PELVIC COMPLETE WITH TRANSVAGINAL Result Date: 05/21/2020 CLINICAL DATA:  Pelvic pain EXAM: TRANSABDOMINAL AND TRANSVAGINAL ULTRASOUND OF PELVIS TECHNIQUE: Both transabdominal and transvaginal ultrasound examinations of the pelvis were performed. Transabdominal technique was performed for global imaging of the pelvis including uterus, ovaries, adnexal regions, and pelvic cul-de-sac. It was necessary to proceed with endovaginal exam following the transabdominal exam to visualize the uterus, endometrium, ovaries and adnexa. COMPARISON:  None FINDINGS: Uterus Measurements: 11.5 x 5.2 x 6.2 cm = volume: 197 mL. At least 3 fibroids noted, the largest a left posterior fibroid measuring up to 2.7 cm. Endometrium Thickness: 7 mm in thickness.  No focal abnormality visualized. Right ovary Measurements: 1.9 x 1.5 x 2.1 cm = volume: 3.1 mL. Normal appearance/no adnexal mass. Left ovary Measurements: 3.2 x 1.6 x 2.9 cm = volume: 8.0 mL. Normal appearance/no adnexal mass. Other findings No abnormal free fluid. IMPRESSION: Fibroid uterus. No acute findings. Electronically Signed   By: Franky Crease M.D.   On: 05/21/2020 10:57    Assessment & Plan:   Problem List Items Addressed This Visit     Dyslipidemia -  Primary   Mild.  Discussed.  Obtain coronary calcium CT score      Relevant Orders   CT CARDIAC SCORING (SELF PAY ONLY)   Comprehensive metabolic panel with GFR   Hemoglobin A1c   TSH   Lipid panel   Hemochromatosis (Chronic)   Emsley is seeing Dr. Cloretta Her ferritin and the CBC are being monitored      Depression   Continue with Trintellix   Relevant Medications   vortioxetine  HBr (TRINTELLIX ) 10 MG TABS tablet   Hypertension   Diabetes type 2, controlled (HCC)   Continue on metformin  Obtain lab work including hemoglobin A1c.  Continue with weight loss Discussed other options, i.e. Ozempic      Relevant Medications   metFORMIN  (GLUCOPHAGE ) 500 MG tablet   Other Relevant Orders   Hemoglobin A1c   ADD (attention deficit disorder)   Adderall is helping.  Trenita forgets to take her second Adderall dose a lot.  Will switch to Adderall ER 20 mg every morning.  Hopefully it will produce a longer smoother effect  Potential benefits of a long term amphetamines  use as well as potential risks  and complications were explained to the patient and were aknowledged.          Meds ordered this encounter  Medications   metFORMIN  (GLUCOPHAGE ) 500 MG tablet    Sig: Take 1 tablet (500 mg total) by mouth daily with breakfast.    Dispense:  90 tablet    Refill:  3   vortioxetine  HBr (TRINTELLIX ) 10 MG TABS tablet    Sig: Take 1 tablet (10 mg total) by mouth daily.    Dispense:  90 tablet    Refill:  1    Schedule office visit   DISCONTD: amphetamine -dextroamphetamine  (ADDERALL XR) 20 MG 24 hr capsule    Sig: Take 1 capsule (20 mg total) by mouth every morning.    Dispense:  30 capsule    Refill:  0   amphetamine -dextroamphetamine  (ADDERALL XR) 20 MG 24 hr capsule    Sig: Take 1 capsule (20 mg total) by mouth every morning.    Dispense:  30 capsule    Refill:  0      Follow-up: Return in about 3 months (around 07/14/2024) for a follow-up visit.  Marolyn Noel, MD "

## 2024-04-16 NOTE — Assessment & Plan Note (Signed)
 Continue with Trintellix

## 2024-04-16 NOTE — Assessment & Plan Note (Signed)
 Molly Quinn is seeing Dr. Cloretta Her ferritin and the CBC are being monitored

## 2024-04-16 NOTE — Assessment & Plan Note (Addendum)
 Continue on metformin  Obtain lab work including hemoglobin A1c.  Continue with weight loss Discussed other options, i.e. Ozempic

## 2024-04-16 NOTE — Assessment & Plan Note (Signed)
 Adderall is helping.  Molly Quinn forgets to take her second Adderall dose a lot.  Will switch to Adderall ER 20 mg every morning.  Hopefully it will produce a longer smoother effect  Potential benefits of a long term amphetamines  use as well as potential risks  and complications were explained to the patient and were aknowledged.

## 2024-04-16 NOTE — Assessment & Plan Note (Signed)
 Mild.  Discussed.  Obtain coronary calcium CT score

## 2024-04-16 NOTE — Patient Instructions (Addendum)
 Cardiac CT calcium scoring test $99    Computed tomography, more commonly known as a CT or CAT scan, is a diagnostic medical imaging test. Like traditional x-rays, it produces multiple images or pictures of the inside of the body. The cross-sectional images generated during a CT scan can be reformatted in multiple planes. They can even generate three-dimensional images. These images can be viewed on a computer monitor. CT images of internal organs, bones, soft tissue and blood vessels provide greater detail than traditional x-rays, particularly of soft tissues and blood vessels. A cardiac CT scan for coronary calcium is a non-invasive way of obtaining information about the presence, location and extent of calcified plaque in the coronary arteries--the vessels that supply oxygen-containing blood to the heart muscle. Calcified plaque results when there is a build-up of fat and other substances under the inner layer of the artery. This material can calcify which signals the presence of atherosclerosis, a disease of the vessel wall, also called coronary artery disease (CAD). People with this disease have an increased risk for heart attacks. In addition, over time, progression of plaque build up (CAD) can narrow the arteries or even close off blood flow to the heart. The result may be chest pain, sometimes called angina, or a heart attack. Because calcium is a marker of CAD, the amount of calcium detected on a cardiac CT scan is a helpful prognostic tool. The findings on cardiac CT are expressed as a calcium score. Another name for this test is coronary artery calcium scoring.  What are some common uses of the procedure? The goal of cardiac CT scan for calcium scoring is to determine if CAD is present and to what extent, even if there are no symptoms. It is a screening study that may be recommended by a physician for patients with risk factors for CAD but no clinical symptoms. The major risk factors for CAD  are: high blood cholesterol levels  family history of heart attacks  diabetes  high blood pressure  cigarette smoking  overweight or obese  physical inactivity   A negative cardiac CT scan for calcium scoring shows no calcification within the coronary arteries. This suggests that CAD is absent or so minimal it cannot be seen by this technique. The chance of having a heart attack over the next two to five years is very low under these circumstances. A positive test means that CAD is present, regardless of whether or not the patient is experiencing any symptoms. The amount of calcification--expressed as the calcium score--may help to predict the likelihood of a myocardial infarction (heart attack) in the coming years and helps your medical doctor or cardiologist decide whether the patient may need to take preventive medicine or undertake other measures such as diet and exercise to lower the risk for heart attack. The extent of CAD is graded according to your calcium score:  Calcium Score  Presence of CAD (coronary artery disease)  0 No evidence of CAD   1-10 Minimal evidence of CAD  11-100 Mild evidence of CAD  101-400 Moderate evidence of CAD  Over 400 Extensive evidence of CAD   Coronary artery calcium (CAC) score is a strong predictor of incident coronary heart disease (CHD) and provides predictive information beyond traditional risk factors. CAC scoring is reasonable to use in the decision to withhold, postpone, or initiate statin therapy in intermediate-risk or selected borderline-risk asymptomatic adults (age 91-75 years and LDL-C >=70 to <190 mg/dL) who do not have diabetes or established atherosclerotic  cardiovascular disease (ASCVD).* In intermediate-risk (10-year ASCVD risk >=7.5% to <20%) adults or selected borderline-risk (10-year ASCVD risk >=5% to <7.5%) adults in whom a CAC score is measured for the purpose of making a treatment decision the following recommendations have  been made:   If CAC=0, it is reasonable to withhold statin therapy and reassess in 5 to 10 years, as long as higher risk conditions are absent (diabetes mellitus, family history of premature CHD in first degree relatives (males <55 years; females <65 years), cigarette smoking, or LDL >=190 mg/dL).   If CAC is 1 to 99, it is reasonable to initiate statin therapy for patients >=42 years of age.   If CAC is >=100 or >=75th percentile, it is reasonable to initiate statin therapy at any age.   Cardiology referral should be considered for patients with CAC scores >=400 or >=75th percentile.   *2018 AHA/ACC/AACVPR/AAPA/ABC/ACPM/ADA/AGS/APhA/ASPC/NLA/PCNA Guideline on the Management of Blood Cholesterol: A Report of the American College of Cardiology/American Heart Association Task Force on Clinical Practice Guidelines. J Am Coll Cardiol. 2019;73(24):3168-3209.   Recent large-scale observational studies provide strong evidence that the shingles vaccine is associated with a reduced risk of developing dementia. It may also help slow cognitive decline in individuals who already have dementia.  Key Findings from Recent Research Multiple natural experiment studies, which leveraged unique vaccination policies to minimize bias, have consistently found a protective link:  Reduced Risk: One large study, published in [place], analyzed health records from over 280,000 older adults in [place] and found that those who received the live-attenuated shingles vaccine (Zostavax, now discontinued in the [place] ) were approximately 20% less likely to develop dementia over a seven-year follow-up period. Slower Progression: A follow-up study published in Cell indicated that for those already living with dementia, the vaccine appeared to slow the progression of the disease and reduced dementia-related deaths by nearly half over nine years. Vascular Dementia Protection: Other research presented at [facility] 2025 indicated that  the vaccine lowered the risk of vascular dementia by 50%. Newer Vaccine: A separate study published in [facility] suggested that the newer, more effective recombinant vaccine (Shingrix, currently used in the [place] ) also offers significant protection against dementia, with an association of lower risk than the older vaccine type. Stronger Effect in Women: Several studies noted that the protective effect against dementia was more pronounced in women than in men, possibly due to differences in immune responses or dementia pathogenesis.  Why Might the Vaccine Help? Researchers are still working to determine the exact mechanism, but current theories center on the idea that preventing the shingles virus (varicella-zoster) from reactivating helps protect the brain:  Reduced Inflammation: The most likely explanation is that the vaccine prevents shingles infections and subsequent inflammation in the nervous system, which is a known risk factor for neurodegenerative diseases like dementia. Immune System Modulation: It's also possible that the vaccine boosts the immune system more broadly, helping the body combat other processes related to cognitive decline.  Important Considerations Observational Data: While the evidence is strong, these findings primarily come from observational studies, which show an association, not a definitive cause-and-effect relationship. A large-scale randomized controlled trial is the gold standard for conclusive proof, but such trials are difficult to conduct for dementia due to the time and cost involved. Public Health Recommendation: The [place]. Centers for Disease Control and Prevention (CDC) already recommends the two-dose Shingrix vaccine for all healthy adults aged 48 and older primarily to prevent shingles and its painful complications. The potential added benefit  for dementia prevention provides another compelling reason to get vaccinated.

## 2024-04-17 ENCOUNTER — Telehealth: Payer: Self-pay

## 2024-04-17 NOTE — Telephone Encounter (Signed)
 Spoke with Pharmacy staff and was able to inform them of the DX for medication.

## 2024-04-17 NOTE — Telephone Encounter (Signed)
 Copied from CRM #[id number]. Topic: Clinical - Medication Question >> Apr 17, 2024 11:10 AM Burnard DEL wrote: Reason for CRM: Pharmacy called and they are needing a diagnosis code for  amphetamine -dextroamphetamine  (ADDERALL XR) 20 MG 24 hr capsule

## 2024-04-30 ENCOUNTER — Other Ambulatory Visit

## 2024-07-01 ENCOUNTER — Inpatient Hospital Stay

## 2024-12-23 ENCOUNTER — Inpatient Hospital Stay: Admitting: Oncology

## 2024-12-23 ENCOUNTER — Inpatient Hospital Stay
